# Patient Record
Sex: Female | Born: 1965
Health system: Southern US, Community
[De-identification: ages and names within clinical notes are randomized; demographics above are authoritative.]

## PROBLEM LIST (undated history)

## (undated) DIAGNOSIS — I639 Cerebral infarction, unspecified: Secondary | ICD-10-CM

## (undated) DIAGNOSIS — Z1379 Encounter for other screening for genetic and chromosomal anomalies: Principal | ICD-10-CM

## (undated) DIAGNOSIS — N189 Chronic kidney disease, unspecified: Secondary | ICD-10-CM

## (undated) DIAGNOSIS — F419 Anxiety disorder, unspecified: Secondary | ICD-10-CM

## (undated) DIAGNOSIS — Z803 Family history of malignant neoplasm of breast: Secondary | ICD-10-CM

## (undated) DIAGNOSIS — R110 Nausea: Secondary | ICD-10-CM

## (undated) DIAGNOSIS — T7840XA Allergy, unspecified, initial encounter: Secondary | ICD-10-CM

## (undated) DIAGNOSIS — C50919 Malignant neoplasm of unspecified site of unspecified female breast: Secondary | ICD-10-CM

## (undated) DIAGNOSIS — R51 Headache: Secondary | ICD-10-CM

## (undated) DIAGNOSIS — Z9221 Personal history of antineoplastic chemotherapy: Secondary | ICD-10-CM

## (undated) DIAGNOSIS — M797 Fibromyalgia: Secondary | ICD-10-CM

## (undated) DIAGNOSIS — R011 Cardiac murmur, unspecified: Secondary | ICD-10-CM

## (undated) DIAGNOSIS — S0300XA Dislocation of jaw, unspecified side, initial encounter: Secondary | ICD-10-CM

## (undated) DIAGNOSIS — F329 Major depressive disorder, single episode, unspecified: Secondary | ICD-10-CM

## (undated) HISTORY — DX: Allergy, unspecified, initial encounter: T78.40XA

## (undated) HISTORY — DX: Family history of malignant neoplasm of breast: Z80.3

## (undated) HISTORY — DX: Nausea: R11.0

## (undated) HISTORY — DX: Encounter for other screening for genetic and chromosomal anomalies: Z13.79

## (undated) HISTORY — DX: Malignant neoplasm of unspecified site of unspecified female breast: C50.919

## (undated) HISTORY — DX: Cardiac murmur, unspecified: R01.1

---
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
## 1991-07-17 DIAGNOSIS — T7840XA Allergy, unspecified, initial encounter: Secondary | ICD-10-CM

## 1991-07-17 DIAGNOSIS — R519 Headache, unspecified: Secondary | ICD-10-CM

## 1991-07-17 HISTORY — DX: Headache, unspecified: R51.9

## 1991-07-17 HISTORY — DX: Allergy, unspecified, initial encounter: T78.40XA

## 1993-07-16 DIAGNOSIS — F419 Anxiety disorder, unspecified: Secondary | ICD-10-CM

## 1993-07-16 DIAGNOSIS — S0300XA Dislocation of jaw, unspecified side, initial encounter: Secondary | ICD-10-CM

## 1993-07-16 HISTORY — DX: Anxiety disorder, unspecified: F41.9

## 1993-07-16 HISTORY — DX: Dislocation of jaw, unspecified side, initial encounter: S03.00XA

## 1998-04-19 ENCOUNTER — Other Ambulatory Visit: Admission: RE | Admit: 1998-04-19 | Discharge: 1998-04-19 | Payer: Self-pay | Admitting: *Deleted

## 1998-07-16 DIAGNOSIS — I639 Cerebral infarction, unspecified: Secondary | ICD-10-CM

## 1998-07-16 HISTORY — DX: Cerebral infarction, unspecified: I63.9

## 1998-10-31 ENCOUNTER — Inpatient Hospital Stay (HOSPITAL_COMMUNITY): Admission: AD | Admit: 1998-10-31 | Discharge: 1998-11-03 | Payer: Self-pay | Admitting: Obstetrics and Gynecology

## 1999-01-03 ENCOUNTER — Other Ambulatory Visit: Admission: RE | Admit: 1999-01-03 | Discharge: 1999-01-03 | Payer: Self-pay | Admitting: *Deleted

## 2000-02-21 ENCOUNTER — Other Ambulatory Visit: Admission: RE | Admit: 2000-02-21 | Discharge: 2000-02-21 | Payer: Self-pay | Admitting: *Deleted

## 2000-07-16 DIAGNOSIS — F32A Depression, unspecified: Secondary | ICD-10-CM

## 2000-07-16 DIAGNOSIS — M797 Fibromyalgia: Secondary | ICD-10-CM

## 2000-07-16 HISTORY — DX: Depression, unspecified: F32.A

## 2000-07-16 HISTORY — DX: Fibromyalgia: M79.7

## 2001-02-20 ENCOUNTER — Other Ambulatory Visit: Admission: RE | Admit: 2001-02-20 | Discharge: 2001-02-20 | Payer: Self-pay | Admitting: Obstetrics and Gynecology

## 2001-03-11 ENCOUNTER — Ambulatory Visit (HOSPITAL_COMMUNITY): Admission: RE | Admit: 2001-03-11 | Discharge: 2001-03-11 | Payer: Self-pay | Admitting: *Deleted

## 2001-03-11 ENCOUNTER — Encounter: Payer: Self-pay | Admitting: *Deleted

## 2002-03-20 ENCOUNTER — Other Ambulatory Visit: Admission: RE | Admit: 2002-03-20 | Discharge: 2002-03-20 | Payer: Self-pay | Admitting: Obstetrics and Gynecology

## 2003-05-13 ENCOUNTER — Other Ambulatory Visit: Admission: RE | Admit: 2003-05-13 | Discharge: 2003-05-13 | Payer: Self-pay | Admitting: Obstetrics and Gynecology

## 2008-12-15 ENCOUNTER — Encounter: Admission: RE | Admit: 2008-12-15 | Discharge: 2008-12-15 | Payer: Self-pay | Admitting: Obstetrics and Gynecology

## 2008-12-23 ENCOUNTER — Encounter: Admission: RE | Admit: 2008-12-23 | Discharge: 2008-12-23 | Payer: Self-pay | Admitting: Obstetrics and Gynecology

## 2009-07-16 HISTORY — PX: ENDOMETRIAL ABLATION: SHX621

## 2010-04-27 ENCOUNTER — Emergency Department (HOSPITAL_COMMUNITY): Admission: EM | Admit: 2010-04-27 | Discharge: 2010-04-27 | Payer: Self-pay | Admitting: Emergency Medicine

## 2010-09-28 ENCOUNTER — Other Ambulatory Visit: Payer: Self-pay | Admitting: Family Medicine

## 2010-09-28 DIAGNOSIS — Z1231 Encounter for screening mammogram for malignant neoplasm of breast: Secondary | ICD-10-CM

## 2010-10-02 ENCOUNTER — Ambulatory Visit
Admission: RE | Admit: 2010-10-02 | Discharge: 2010-10-02 | Disposition: A | Discharge status: Home / Self Care | Payer: 59 | Source: Ambulatory Visit | Attending: Family Medicine | Admitting: Family Medicine

## 2010-10-02 DIAGNOSIS — Z1231 Encounter for screening mammogram for malignant neoplasm of breast: Secondary | ICD-10-CM

## 2012-08-14 ENCOUNTER — Other Ambulatory Visit: Payer: Self-pay | Admitting: Medical

## 2012-08-14 DIAGNOSIS — Z1231 Encounter for screening mammogram for malignant neoplasm of breast: Secondary | ICD-10-CM

## 2012-09-12 ENCOUNTER — Ambulatory Visit: Payer: 59

## 2013-04-01 ENCOUNTER — Other Ambulatory Visit: Payer: Self-pay

## 2013-04-01 DIAGNOSIS — Z1231 Encounter for screening mammogram for malignant neoplasm of breast: Secondary | ICD-10-CM

## 2013-04-06 ENCOUNTER — Ambulatory Visit
Admission: RE | Admit: 2013-04-06 | Discharge: 2013-04-06 | Disposition: A | Discharge status: Home / Self Care | Payer: 59 | Source: Ambulatory Visit

## 2013-04-06 DIAGNOSIS — Z1231 Encounter for screening mammogram for malignant neoplasm of breast: Secondary | ICD-10-CM

## 2013-04-09 ENCOUNTER — Other Ambulatory Visit: Payer: Self-pay | Admitting: Medical

## 2013-04-09 DIAGNOSIS — R928 Other abnormal and inconclusive findings on diagnostic imaging of breast: Secondary | ICD-10-CM

## 2013-04-27 ENCOUNTER — Ambulatory Visit
Admission: RE | Admit: 2013-04-27 | Discharge: 2013-04-27 | Disposition: A | Discharge status: Home / Self Care | Payer: 59 | Source: Ambulatory Visit | Attending: Medical | Admitting: Medical

## 2013-04-27 DIAGNOSIS — R928 Other abnormal and inconclusive findings on diagnostic imaging of breast: Secondary | ICD-10-CM

## 2014-05-02 ENCOUNTER — Emergency Department (HOSPITAL_COMMUNITY)
Admission: EM | Admit: 2014-05-02 | Discharge: 2014-05-03 | Disposition: A | Discharge status: Rehabilitation Facility | Payer: 59 | Attending: Emergency Medicine | Admitting: Emergency Medicine

## 2014-05-02 ENCOUNTER — Encounter (HOSPITAL_COMMUNITY): Payer: Self-pay | Admitting: Emergency Medicine

## 2014-05-02 DIAGNOSIS — Z8673 Personal history of transient ischemic attack (TIA), and cerebral infarction without residual deficits: Secondary | ICD-10-CM | POA: Insufficient documentation

## 2014-05-02 DIAGNOSIS — R51 Headache: Secondary | ICD-10-CM | POA: Diagnosis not present

## 2014-05-02 DIAGNOSIS — F3110 Bipolar disorder, current episode manic without psychotic features, unspecified: Secondary | ICD-10-CM | POA: Diagnosis not present

## 2014-05-02 DIAGNOSIS — Z79899 Other long term (current) drug therapy: Secondary | ICD-10-CM | POA: Diagnosis not present

## 2014-05-02 DIAGNOSIS — Z87891 Personal history of nicotine dependence: Secondary | ICD-10-CM | POA: Diagnosis not present

## 2014-05-02 DIAGNOSIS — F1210 Cannabis abuse, uncomplicated: Secondary | ICD-10-CM | POA: Diagnosis not present

## 2014-05-02 DIAGNOSIS — F911 Conduct disorder, childhood-onset type: Secondary | ICD-10-CM | POA: Diagnosis present

## 2014-05-02 DIAGNOSIS — Z8719 Personal history of other diseases of the digestive system: Secondary | ICD-10-CM | POA: Diagnosis not present

## 2014-05-02 DIAGNOSIS — F309 Manic episode, unspecified: Secondary | ICD-10-CM

## 2014-05-02 HISTORY — DX: Dislocation of jaw, unspecified side, initial encounter: S03.00XA

## 2014-05-02 HISTORY — DX: Cerebral infarction, unspecified: I63.9

## 2014-05-02 NOTE — ED Notes (Signed)
Bed: JO84 Expected date:  Expected time:  Means of arrival:  Comments: EMS 40F combative, ?off meds

## 2014-05-02 NOTE — ED Notes (Signed)
Pt's family told EMS that pt has been acting aggressively since Thursday.  Question as to if pt has been taking her Zoloft and amitriptyline, pill bottles still have medications in them even though they are past due to be filled.

## 2014-05-03 DIAGNOSIS — F319 Bipolar disorder, unspecified: Secondary | ICD-10-CM

## 2014-05-03 LAB — CBC WITH DIFFERENTIAL/PLATELET
Basophils Absolute: 0 10*3/uL (ref 0.0–0.1)
Basophils Relative: 0 % (ref 0–1)
EOS ABS: 0 10*3/uL (ref 0.0–0.7)
Eosinophils Relative: 0 % (ref 0–5)
HCT: 43.2 % (ref 36.0–46.0)
HEMOGLOBIN: 14.7 g/dL (ref 12.0–15.0)
LYMPHS ABS: 1.7 10*3/uL (ref 0.7–4.0)
LYMPHS PCT: 19 % (ref 12–46)
MCH: 31.2 pg (ref 26.0–34.0)
MCHC: 34 g/dL (ref 30.0–36.0)
MCV: 91.7 fL (ref 78.0–100.0)
MONOS PCT: 10 % (ref 3–12)
Monocytes Absolute: 0.9 10*3/uL (ref 0.1–1.0)
NEUTROS ABS: 6.3 10*3/uL (ref 1.7–7.7)
NEUTROS PCT: 71 % (ref 43–77)
PLATELETS: 263 10*3/uL (ref 150–400)
RBC: 4.71 MIL/uL (ref 3.87–5.11)
RDW: 12.6 % (ref 11.5–15.5)
WBC: 9 10*3/uL (ref 4.0–10.5)

## 2014-05-03 LAB — URINALYSIS, ROUTINE W REFLEX MICROSCOPIC
Bilirubin Urine: NEGATIVE
Glucose, UA: NEGATIVE mg/dL
Ketones, ur: NEGATIVE mg/dL
NITRITE: NEGATIVE
PH: 6 (ref 5.0–8.0)
Protein, ur: NEGATIVE mg/dL
SPECIFIC GRAVITY, URINE: 1.006 (ref 1.005–1.030)
UROBILINOGEN UA: 0.2 mg/dL (ref 0.0–1.0)

## 2014-05-03 LAB — RAPID URINE DRUG SCREEN, HOSP PERFORMED
AMPHETAMINES: NOT DETECTED
BARBITURATES: NOT DETECTED
BENZODIAZEPINES: NOT DETECTED
Cocaine: NOT DETECTED
Opiates: NOT DETECTED
Tetrahydrocannabinol: POSITIVE — AB

## 2014-05-03 LAB — COMPREHENSIVE METABOLIC PANEL
ALK PHOS: 65 U/L (ref 39–117)
ALT: 15 U/L (ref 0–35)
AST: 21 U/L (ref 0–37)
Albumin: 4.4 g/dL (ref 3.5–5.2)
Anion gap: 15 (ref 5–15)
BILIRUBIN TOTAL: 0.4 mg/dL (ref 0.3–1.2)
BUN: 6 mg/dL (ref 6–23)
CHLORIDE: 99 meq/L (ref 96–112)
CO2: 25 mEq/L (ref 19–32)
Calcium: 9.2 mg/dL (ref 8.4–10.5)
Creatinine, Ser: 0.66 mg/dL (ref 0.50–1.10)
GFR calc non Af Amer: >90 mL/min (ref >90)
GLUCOSE: 116 mg/dL — AB (ref 70–99)
POTASSIUM: 3.7 meq/L (ref 3.7–5.3)
SODIUM: 139 meq/L (ref 137–147)
TOTAL PROTEIN: 7.9 g/dL (ref 6.0–8.3)

## 2014-05-03 LAB — ETHANOL

## 2014-05-03 LAB — URINE MICROSCOPIC-ADD ON

## 2014-05-03 MED ORDER — DIPHENHYDRAMINE HCL 50 MG/ML IJ SOLN
50.0000 mg | Freq: Once | INTRAMUSCULAR | Status: AC
  Administered 2014-05-03: 50 mg via INTRAMUSCULAR
  Filled 2014-05-03: qty 1

## 2014-05-03 MED ORDER — STERILE WATER FOR INJECTION IJ SOLN
INTRAMUSCULAR | Status: AC
  Administered 2014-05-03: 1.2 mL
  Filled 2014-05-03: qty 10

## 2014-05-03 MED ORDER — TRAZODONE HCL 100 MG PO TABS
100.0000 mg | ORAL_TABLET | Freq: Every day | ORAL | Status: DC
  Administered 2014-05-03: 100 mg via ORAL
  Filled 2014-05-03: qty 1

## 2014-05-03 MED ORDER — AMITRIPTYLINE HCL 25 MG PO TABS
25.0000 mg | ORAL_TABLET | Freq: Every evening | ORAL | Status: DC | PRN
  Administered 2014-05-03: 25 mg via ORAL
  Filled 2014-05-03: qty 1

## 2014-05-03 MED ORDER — SERTRALINE HCL 50 MG PO TABS
150.0000 mg | ORAL_TABLET | Freq: Every day | ORAL | Status: DC
  Administered 2014-05-03: 150 mg via ORAL
  Filled 2014-05-03: qty 3

## 2014-05-03 MED ORDER — ZIPRASIDONE MESYLATE 20 MG IM SOLR
20.0000 mg | Freq: Once | INTRAMUSCULAR | Status: AC
  Administered 2014-05-03: 20 mg via INTRAMUSCULAR
  Filled 2014-05-03: qty 20

## 2014-05-03 MED ORDER — LORAZEPAM 2 MG/ML IJ SOLN
2.0000 mg | Freq: Once | INTRAMUSCULAR | Status: AC
  Administered 2014-05-03: 2 mg via INTRAMUSCULAR
  Filled 2014-05-03: qty 1
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
## 2014-05-03 NOTE — ED Provider Notes (Signed)
CSN: [id number]     Arrival date & time 05/02/14  2314 History   First MD Initiated Contact with Patient 05/02/14 2324     Chief Complaint  Patient presents with  . Aggressive Behavior      HPI Patient has history bipolar disorder.  Patient was encouraged to come the emergency department by her family secondary to aggressive behavior at home and manic-like symptoms.  Family reports the patient has not slept in 3 nights.  The patient has abnormal thinking per the family.  They state that she cannot seem to complete a thought and her thoughts are very tangential.  History of similar symptoms before.  He states this usually occurs when she is noncompliant with her medications.  The patient admits to intermittent compliance with her Zoloft.  No recent illness.  No fevers or chills.  No vomiting.  Patient did have a mild headache today but this is not atypical for the patient.  No complaints of headache at this time.   Past Medical History  Diagnosis Date  . Bipolar 1 disorder   . TMJ (dislocation of temporomandibular joint)   . Stroke    Past Surgical History  Procedure Laterality Date  . Cesarean section     History reviewed. No pertinent family history. History  Substance Use Topics  . Smoking status: Former Research scientist (life sciences)  . Smokeless tobacco: Not on file  . Alcohol Use: Yes     Comment: sometimes   OB History   Grav Para Term Preterm Abortions TAB SAB Ect Mult Living                 Review of Systems  All other systems reviewed and are negative.     Allergies  Other  Home Medications   Prior to Admission medications   Medication Sig Start Date End Date Taking? Authorizing Provider  amitriptyline (ELAVIL) 25 MG tablet Take 25 mg by mouth at bedtime as needed for sleep.   Yes Historical Provider, MD  sertraline (ZOLOFT) 100 MG tablet Take 150 mg by mouth at bedtime.   Yes Historical Provider, MD   BP 139/76  Pulse 89  Temp(Src) 98.2 F (36.8 C) (Oral)  Resp 16  SpO2  100% Physical Exam  Nursing note and vitals reviewed. Constitutional: She is oriented to person, place, and time. She appears well-developed and well-nourished. No distress.  HENT:  Head: Normocephalic and atraumatic.  Eyes: EOM are normal.  Neck: Normal range of motion.  Cardiovascular: Normal rate, regular rhythm and normal heart sounds.   Pulmonary/Chest: Effort normal and breath sounds normal.  Abdominal: Soft. She exhibits no distension. There is no tenderness.  Musculoskeletal: Normal range of motion.  Neurological: She is alert and oriented to person, place, and time.  Skin: Skin is warm and dry.  Psychiatric: Judgment normal. Her mood appears not anxious. Her affect is not angry and not inappropriate. Her speech is not rapid and/or pressured. She is not agitated. Thought content is not paranoid and not delusional. She expresses no homicidal and no suicidal ideation. She exhibits abnormal recent memory.    ED Course  Procedures (including critical care time) Labs Review Labs Reviewed  URINALYSIS, ROUTINE W REFLEX MICROSCOPIC  CBC WITH DIFFERENTIAL  COMPREHENSIVE METABOLIC PANEL  URINE RAPID DRUG SCREEN (HOSP PERFORMED)  ETHANOL    Imaging Review No results found.   EKG Interpretation None      MDM   Final diagnoses:  None   Patient is likely having some degree  of increasing mania.  She has not been a significant manic state at this time but I believe that if she does not receive urgent stabilization and will return to this.  TTS consultation.  I think the patient will benefit from inpatient stabilization.  We'll restart her home meds at this time.    Hoy Morn, MD 05/03/14 [phone]

## 2014-05-03 NOTE — BH Assessment (Signed)
Received a call from Chapin with [place]. Sts that patient is accepted to [facility] by Dr. Dareen Piano. Nursing report # is [id number].

## 2014-05-03 NOTE — BH Assessment (Signed)
Assessment completed. Consulted Waylan Boga, NP who agrees that patient should be evaluated by psychiatry in the morning. Dr. Venora Maples has been informed of the recommendation.

## 2014-05-03 NOTE — ED Notes (Signed)
Patient walks out into the hall speaking loudly about conversations she has had with God as child. She is referring to her husband as a Programmer, systems" and that she want to be taken home " to heaven". Her conversation has been very sexual in nature and very disorganized.

## 2014-05-03 NOTE — ED Notes (Signed)
Password "[email or web address]" created w/ family so they can call and get updates on pt's condition.

## 2014-05-03 NOTE — ED Notes (Addendum)
Pt. Has one bag that is a red color with clothes in it and it is in locker 28.
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
## 2014-05-03 NOTE — ED Notes (Signed)
[facility] called for transport. 

## 2015-04-01 ENCOUNTER — Encounter: Payer: Self-pay | Admitting: Gastroenterology

## 2015-05-26 ENCOUNTER — Ambulatory Visit (AMBULATORY_SURGERY_CENTER): Payer: Self-pay

## 2015-05-26 VITALS — Ht 68.75 in | Wt 190.2 lb

## 2015-05-26 DIAGNOSIS — Z1211 Encounter for screening for malignant neoplasm of colon: Secondary | ICD-10-CM

## 2015-05-26 MED ORDER — SUPREP BOWEL PREP KIT 17.5-3.13-1.6 GM/177ML PO SOLN: 1.0000 | Freq: Once | ORAL | Status: DC

## 2015-05-26 NOTE — Progress Notes (Signed)
No allergies to soy BUT IS ALLERGIC TO FLU SHOT AND EGG WHITES No past problems with anesthesia except side effect of itching with spinal No diet/weight loss meds No home oxygen  Has email and internet; refused emmi

## 2015-05-27 ENCOUNTER — Encounter: Payer: Self-pay | Admitting: Gastroenterology

## 2015-06-03 ENCOUNTER — Encounter: Payer: Self-pay | Admitting: Gastroenterology

## 2015-06-03 ENCOUNTER — Ambulatory Visit (AMBULATORY_SURGERY_CENTER): Payer: Commercial Managed Care - HMO | Admitting: Gastroenterology

## 2015-06-03 VITALS — BP 155/87 | HR 65 | Temp 97.1°F | Resp 21 | Ht 68.75 in | Wt 190.0 lb

## 2015-06-03 DIAGNOSIS — D122 Benign neoplasm of ascending colon: Secondary | ICD-10-CM

## 2015-06-03 DIAGNOSIS — Z1211 Encounter for screening for malignant neoplasm of colon: Secondary | ICD-10-CM | POA: Diagnosis not present

## 2015-06-03 DIAGNOSIS — D120 Benign neoplasm of cecum: Secondary | ICD-10-CM | POA: Diagnosis not present

## 2015-06-03 MED ORDER — SODIUM CHLORIDE 0.9 % IV SOLN: 500.0000 mL | INTRAVENOUS | Status: DC

## 2015-06-03 NOTE — Progress Notes (Signed)
To recovery, report to Willis, RN, VSS 

## 2015-06-03 NOTE — Patient Instructions (Signed)
YOU HAD AN ENDOSCOPIC PROCEDURE TODAY AT THE [facility]:   Refer to the procedure report that was given to you for any specific questions about what was found during the examination.  If the procedure report does not answer your questions, please call your gastroenterologist to clarify.  If you requested that your care partner not be given the details of your procedure findings, then the procedure report has been included in a sealed envelope for you to review at your convenience later.  YOU SHOULD EXPECT: Some feelings of bloating in the abdomen. Passage of more gas than usual.  Walking can help get rid of the air that was put into your GI tract during the procedure and reduce the bloating. If you had a lower endoscopy (such as a colonoscopy or flexible sigmoidoscopy) you may notice spotting of blood in your stool or on the toilet paper. If you underwent a bowel prep for your procedure, you may not have a normal bowel movement for a few days.  Please Note:  You might notice some irritation and congestion in your nose or some drainage.  This is from the oxygen used during your procedure.  There is no need for concern and it should clear up in a day or so.  SYMPTOMS TO REPORT IMMEDIATELY:   Following lower endoscopy (colonoscopy or flexible sigmoidoscopy):  Excessive amounts of blood in the stool  Significant tenderness or worsening of abdominal pains  Swelling of the abdomen that is new, acute  Fever of 100F or higher   For urgent or emergent issues, a gastroenterologist can be reached at any hour by calling [phone].   DIET: Your first meal following the procedure should be a small meal and then it is ok to progress to your normal diet. Heavy or fried foods are harder to digest and may make you feel nauseous or bloated.  Likewise, meals heavy in dairy and vegetables can increase bloating.  Drink plenty of fluids but you should avoid alcoholic beverages for 24  hours.  ACTIVITY:  You should plan to take it easy for the rest of today and you should NOT DRIVE or use heavy machinery until tomorrow (because of the sedation medicines used during the test).    FOLLOW UP: Our staff will call the number listed on your records the next business day following your procedure to check on you and address any questions or concerns that you may have regarding the information given to you following your procedure. If we do not reach you, we will leave a message.  However, if you are feeling well and you are not experiencing any problems, there is no need to return our call.  We will assume that you have returned to your regular daily activities without incident.  If any biopsies were taken you will be contacted by phone or by letter within the next 1-3 weeks.  Please call us at [phone] if you have not heard about the biopsies in 3 weeks.    SIGNATURES/CONFIDENTIALITY: You and/or your care partner have signed paperwork which will be entered into your electronic medical record.  These signatures attest to the fact that that the information above on your After Visit Summary has been reviewed and is understood.  Full responsibility of the confidentiality of this discharge information lies with you and/or your care-partner.    Handouts were given to your care partner on polyps, diverticulosis, hemorrhoids, and a high fiber diet with liberal fluid intake.  You   may resume your current medications today. Await biopsy results. Please call if any questions or concerns.   

## 2015-06-03 NOTE — Progress Notes (Signed)
No problems noted in the recovery room. Maw  Per Dr. Ardis Hughs per called Eulis Canner, CMA to make a referral for to genaral surgery for external hemorrhoid care.  14:10 left voice mail on [employer]'s answering machine to call pt and arrange. maw

## 2015-06-03 NOTE — Progress Notes (Signed)
Pt states that she cannot eat raw eggs- gives her a rash.  She can eat them cooked.  Waynard Edwards CRNA made aware per [employer] RN and ok to proceed with propofol

## 2015-06-03 NOTE — Op Note (Signed)
[place]  [employer] [place], [id number]   COLONOSCOPY PROCEDURE REPORT  PATIENT: Amy Dawson, Amy Dawson  MR#: [id number] BIRTHDATE: April 29, 1966 , 49  yrs. old GENDER: female ENDOSCOPIST: Milus Banister, MD PROCEDURE DATE:  06/03/2015 PROCEDURE:   Colonoscopy, screening and Colonoscopy with snare polypectomy First Screening Colonoscopy - Avg.  risk and is 50 yrs.  old or older Yes.  Prior Negative Screening - Now for repeat screening. N/A  History of Adenoma - Now for follow-up colonoscopy & has been > or = to 3 yrs.  N/A  Polyps removed today? Yes ASA CLASS:   Class II INDICATIONS:Screening for colonic neoplasia and Colorectal Neoplasm Risk Assessment for this procedure is average risk. MEDICATIONS: Monitored anesthesia care and Propofol 300 mg IV  DESCRIPTION OF PROCEDURE:   After the risks benefits and alternatives of the procedure were thoroughly explained, informed consent was obtained.  The digital rectal exam revealed no abnormalities of the rectum.   The LB [id number]  endoscope was introduced through the anus and advanced to the cecum, which was identified by both the appendix and ileocecal valve. No adverse events experienced.   The quality of the prep was excellent.  The instrument was then slowly withdrawn as the colon was fully examined. Estimated blood loss is zero unless otherwise noted in this procedure report.  COLON FINDINGS: Two sessile polyps ranging between 3-35mm in size were found in the ascending colon and at the cecum.  Polypectomies were performed with a cold snare.  The resection was complete, the polyp tissue was completely retrieved and sent to histology. There was mild diverticulosis noted throughout the entire examined colon.   The examination was otherwise normal.  Retroflexed views revealed no abnormalities and Retroflexed views revealed external hemorrhoids. The time to cecum = 2.0 Withdrawal time = 11.8   The scope was  withdrawn and the procedure completed. COMPLICATIONS: There were no immediate complications.  ENDOSCOPIC IMPRESSION: 1.   Two sessile polyps ranging between 3-49mm in size were found in the ascending colon and at the cecum; polypectomies were performed with a cold snare 2.   Mild diverticulosis was noted throughout the entire examined colon 3.   Small external hemorrhoids 3.   The examination was otherwise normal  RECOMMENDATIONS: If the polyp(s) removed today are proven to be adenomatous (pre-cancerous) polyps, you will need a repeat colonoscopy in 5 years.  Otherwise you should continue to follow colorectal cancer screening guidelines for "routine risk" patients with colonoscopy in 10 years.  You will receive a letter within 1-2 weeks with the results of your biopsy as well as final recommendations.  Please call my office if you have not received a letter after 3 weeks.  eSigned:  Milus Banister, MD 06/03/2015 1:46 PM

## 2015-06-03 NOTE — Progress Notes (Signed)
Called to room to assist during endoscopic procedure.  Patient ID and intended procedure confirmed with present staff. Received instructions for my participation in the procedure from the performing physician.  

## 2015-06-06 ENCOUNTER — Telehealth: Payer: Self-pay | Admitting: *Deleted

## 2015-06-06 NOTE — Telephone Encounter (Signed)
Number changed or disconnected per voice mail

## 2015-06-06 NOTE — Telephone Encounter (Signed)
  Follow up Call-  Number has been changed or disconnected attempted x2

## 2015-06-07 ENCOUNTER — Telehealth: Payer: Self-pay

## 2015-06-07 NOTE — Telephone Encounter (Signed)
You have been scheduled for an appointment with Dr Oda Cogan at [facility]. Your appointment is on 06/28/15 at 10:20 am. Please arrive at 9:50 am for registration. Make certain to bring a list of current medications, including any over the counter medications or vitamins. Also bring your co-pay if you have one as well as your insurance cards. [facility] is located at [address]. Should you need to reschedule your appointment, please contact them at [phone].   The patient has been notified of this information and all questions answered.

## 2015-06-07 NOTE — Telephone Encounter (Signed)
Verbal order per Dr Ardis Hughs after procedure, pt needs to have [facility] referral for external hemorrhoids.

## 2015-06-12 ENCOUNTER — Encounter: Payer: Self-pay | Admitting: Gastroenterology

## 2015-09-29 ENCOUNTER — Other Ambulatory Visit: Payer: Self-pay

## 2015-09-29 DIAGNOSIS — Z1231 Encounter for screening mammogram for malignant neoplasm of breast: Secondary | ICD-10-CM

## 2015-10-03 ENCOUNTER — Ambulatory Visit: Payer: Commercial Managed Care - HMO

## 2016-04-04 ENCOUNTER — Ambulatory Visit
Admission: RE | Admit: 2016-04-04 | Discharge: 2016-04-04 | Disposition: A | Discharge status: Home / Self Care | Payer: Commercial Managed Care - HMO | Source: Ambulatory Visit

## 2016-04-04 DIAGNOSIS — Z1231 Encounter for screening mammogram for malignant neoplasm of breast: Secondary | ICD-10-CM

## 2016-07-16 DIAGNOSIS — Z9221 Personal history of antineoplastic chemotherapy: Secondary | ICD-10-CM

## 2016-07-16 HISTORY — DX: Personal history of antineoplastic chemotherapy: Z92.21

## 2017-05-06 ENCOUNTER — Other Ambulatory Visit: Payer: Self-pay | Admitting: Family Medicine

## 2017-05-06 DIAGNOSIS — Z1231 Encounter for screening mammogram for malignant neoplasm of breast: Secondary | ICD-10-CM

## 2017-05-08 ENCOUNTER — Other Ambulatory Visit: Payer: Self-pay | Admitting: Obstetrics and Gynecology

## 2017-05-08 DIAGNOSIS — N63 Unspecified lump in unspecified breast: Secondary | ICD-10-CM

## 2017-05-09 ENCOUNTER — Ambulatory Visit
Admission: RE | Admit: 2017-05-09 | Discharge: 2017-05-09 | Disposition: A | Discharge status: Home / Self Care | Payer: Commercial Managed Care - HMO | Source: Ambulatory Visit | Attending: Obstetrics and Gynecology | Admitting: Obstetrics and Gynecology

## 2017-05-09 ENCOUNTER — Other Ambulatory Visit: Payer: Self-pay | Admitting: Obstetrics and Gynecology

## 2017-05-09 DIAGNOSIS — N63 Unspecified lump in unspecified breast: Secondary | ICD-10-CM

## 2017-05-09 DIAGNOSIS — N6320 Unspecified lump in the left breast, unspecified quadrant: Secondary | ICD-10-CM

## 2017-05-14 ENCOUNTER — Other Ambulatory Visit: Payer: Self-pay | Admitting: Obstetrics and Gynecology

## 2017-05-14 ENCOUNTER — Ambulatory Visit
Admission: RE | Admit: 2017-05-14 | Discharge: 2017-05-14 | Disposition: A | Discharge status: Home / Self Care | Payer: Commercial Managed Care - HMO | Source: Ambulatory Visit | Attending: Obstetrics and Gynecology | Admitting: Obstetrics and Gynecology

## 2017-05-14 DIAGNOSIS — N6320 Unspecified lump in the left breast, unspecified quadrant: Secondary | ICD-10-CM

## 2017-05-14 HISTORY — PX: BREAST BIOPSY: SHX20

## 2017-05-16 ENCOUNTER — Encounter: Payer: Self-pay | Admitting: *Deleted

## 2017-05-21 ENCOUNTER — Other Ambulatory Visit: Payer: Self-pay | Admitting: *Deleted

## 2017-05-21 DIAGNOSIS — Z171 Estrogen receptor negative status [ER-]: Principal | ICD-10-CM

## 2017-05-21 DIAGNOSIS — C50412 Malignant neoplasm of upper-outer quadrant of left female breast: Secondary | ICD-10-CM

## 2017-05-22 ENCOUNTER — Encounter: Payer: Self-pay | Admitting: Physical Therapy

## 2017-05-22 ENCOUNTER — Ambulatory Visit: Payer: 59 | Attending: General Surgery | Admitting: Physical Therapy

## 2017-05-22 ENCOUNTER — Other Ambulatory Visit: Payer: Self-pay | Admitting: *Deleted

## 2017-05-22 ENCOUNTER — Encounter: Payer: Self-pay | Admitting: Oncology

## 2017-05-22 ENCOUNTER — Other Ambulatory Visit (HOSPITAL_BASED_OUTPATIENT_CLINIC_OR_DEPARTMENT_OTHER): Payer: Commercial Managed Care - HMO

## 2017-05-22 ENCOUNTER — Ambulatory Visit: Payer: Commercial Managed Care - HMO | Admitting: Oncology

## 2017-05-22 ENCOUNTER — Ambulatory Visit
Admission: RE | Admit: 2017-05-22 | Discharge: 2017-05-22 | Disposition: A | Discharge status: Home / Self Care | Payer: Commercial Managed Care - HMO | Source: Ambulatory Visit | Attending: Radiation Oncology | Admitting: Radiation Oncology

## 2017-05-22 ENCOUNTER — Encounter: Payer: Self-pay | Admitting: *Deleted

## 2017-05-22 VITALS — BP 130/86 | HR 73 | Temp 98.1°F | Resp 18 | Ht 68.75 in | Wt 176.9 lb

## 2017-05-22 DIAGNOSIS — C50412 Malignant neoplasm of upper-outer quadrant of left female breast: Secondary | ICD-10-CM

## 2017-05-22 DIAGNOSIS — R293 Abnormal posture: Secondary | ICD-10-CM | POA: Diagnosis present

## 2017-05-22 DIAGNOSIS — I619 Nontraumatic intracerebral hemorrhage, unspecified: Secondary | ICD-10-CM

## 2017-05-22 DIAGNOSIS — Z171 Estrogen receptor negative status [ER-]: Secondary | ICD-10-CM | POA: Diagnosis not present

## 2017-05-22 DIAGNOSIS — M797 Fibromyalgia: Secondary | ICD-10-CM | POA: Insufficient documentation

## 2017-05-22 LAB — CBC WITH DIFFERENTIAL/PLATELET
BASO%: 0.6 % (ref 0.0–2.0)
Basophils Absolute: 0 10*3/uL (ref 0.0–0.1)
EOS%: 1.7 % (ref 0.0–7.0)
Eosinophils Absolute: 0.1 10*3/uL (ref 0.0–0.5)
HEMATOCRIT: 39.9 % (ref 34.8–46.6)
HGB: 13.4 g/dL (ref 11.6–15.9)
LYMPH%: 34.8 % (ref 14.0–49.7)
MCH: 30.4 pg (ref 25.1–34.0)
MCHC: 33.5 g/dL (ref 31.5–36.0)
MCV: 90.6 fL (ref 79.5–101.0)
MONO#: 0.3 10*3/uL (ref 0.1–0.9)
MONO%: 8 % (ref 0.0–14.0)
NEUT#: 2.4 10*3/uL (ref 1.5–6.5)
NEUT%: 54.9 % (ref 38.4–76.8)
Platelets: 247 10*3/uL (ref 145–400)
RBC: 4.41 10*6/uL (ref 3.70–5.45)
RDW: 13.3 % (ref 11.2–14.5)
WBC: 4.4 10*3/uL (ref 3.9–10.3)
lymph#: 1.5 10*3/uL (ref 0.9–3.3)

## 2017-05-22 LAB — COMPREHENSIVE METABOLIC PANEL
ALK PHOS: 73 U/L (ref 40–150)
ALT: 9 U/L (ref 0–55)
AST: 13 U/L (ref 5–34)
Albumin: 3.7 g/dL (ref 3.5–5.0)
Anion Gap: 8 mEq/L (ref 3–11)
BUN: 8 mg/dL (ref 7.0–26.0)
CALCIUM: 9.1 mg/dL (ref 8.4–10.4)
CO2: 27 mEq/L (ref 22–29)
Chloride: 106 mEq/L (ref 98–109)
Creatinine: 0.8 mg/dL (ref 0.6–1.1)
GLUCOSE: 117 mg/dL (ref 70–140)
Potassium: 4 mEq/L (ref 3.5–5.1)
Sodium: 141 mEq/L (ref 136–145)
TOTAL PROTEIN: 7.1 g/dL (ref 6.4–8.3)
Total Bilirubin: 0.3 mg/dL (ref 0.20–1.20)

## 2017-05-22 MED ORDER — LORAZEPAM 0.5 MG PO TABS: 0.5000 mg | ORAL_TABLET | Freq: Every evening | ORAL | 0 refills | Status: DC | PRN

## 2017-05-22 MED ORDER — DEXAMETHASONE 4 MG PO TABS: ORAL_TABLET | ORAL | 1 refills | Status: DC

## 2017-05-22 MED ORDER — LIDOCAINE-PRILOCAINE 2.5-2.5 % EX CREA: TOPICAL_CREAM | CUTANEOUS | 3 refills | Status: DC

## 2017-05-22 MED ORDER — PROCHLORPERAZINE MALEATE 10 MG PO TABS: 10.0000 mg | ORAL_TABLET | Freq: Four times a day (QID) | ORAL | 1 refills | Status: DC | PRN

## 2017-05-22 NOTE — Progress Notes (Signed)
Radiation Oncology         [phone] ________________________________  Name: Amy Dawson        MRN: [id number]  Date of Service: 05/22/2017 DOB: Jun 26, 1966  NI:OEVOJJK, No Pcp Per  [name], MD     REFERRING PHYSICIAN: [name], MD   DIAGNOSIS: The encounter diagnosis was Malignant neoplasm of upper-outer quadrant of left breast in female, estrogen receptor negative ([place]).   HISTORY OF PRESENT ILLNESS: Amy Dawson is a 51 y.o. female seen in the multidisciplinary breast clinic for a new diagnosis of left breast cancer. The patient was noted to have a palpable mass in the left breast and on diagnostic imaging which revealed a 2.9 x 2.5 x 2.2 cm at 12:30. Her axilla was negative for adenopathy. She underwent biopsy of this site on 05/14/17 which revealed a grade 2-3 triple negative invasive ductal carcinoma of the left breast with a Ki 67 of 80%. She comes today to discuss options of treatment for her cancer.  PREVIOUS RADIATION THERAPY: No   PAST MEDICAL HISTORY:  Past Medical History:  Diagnosis Date  . Allergy   . Bipolar 1 disorder ([place])   . Murmur, cardiac    as a child  . Stroke ([place])   . TMJ (dislocation of temporomandibular joint)        PAST SURGICAL HISTORY: Past Surgical History:  Procedure Laterality Date  . CESAREAN SECTION     2     FAMILY HISTORY:  Family History  Problem Relation Age of Onset  . Liver cancer Sister   . Breast cancer Maternal Aunt   . Breast cancer Maternal Grandmother   . Colon cancer Neg Hx   . Esophageal cancer Neg Hx   . Rectal cancer Neg Hx   . Stomach cancer Neg Hx      SOCIAL HISTORY:  reports that she has quit smoking. she has never used smokeless tobacco. She reports that she drinks alcohol. She reports that she uses drugs. Drug: Marijuana. The patient is married and lives in [place]. 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   ALLERGIES: Other   MEDICATIONS:  Current  Outpatient Medications  Medication Sig Dispense Refill  . amitriptyline (ELAVIL) 25 MG tablet Take 25 mg by mouth at bedtime as needed for sleep.    . Cetirizine HCl (ZYRTEC ALLERGY PO) Take by mouth daily.    . fluticasone (FLONASE) 50 MCG/ACT nasal spray Place into both nostrils as needed for allergies or rhinitis.    . Pseudoephedrine HCl (SUDAFED PO) Take by mouth as needed.    . sertraline (ZOLOFT) 100 MG tablet Take 150 mg by mouth at bedtime.     No current facility-administered medications for this encounter.      REVIEW OF SYSTEMS: On review of systems, the patient reports that she is doing well overall. She denies any chest pain, shortness of breath, cough, fevers, chills, night sweats, unintended weight changes. She denies any bowel or bladder disturbances, and denies abdominal pain, nausea or vomiting. She denies any new musculoskeletal or joint aches or pains. A complete review of systems is obtained and is otherwise negative.     PHYSICAL EXAM:  Wt Readings from Last 3 Encounters:  06/03/15 190 lb (86.2 kg)  05/26/15 190 lb 3.2 oz (86.3 kg)   Temp Readings from Last 3 Encounters:  06/03/15 97.1 F (36.2 C)  05/03/14 98.7 F (37.1 C) (Oral)   BP Readings from Last 3  Encounters:  06/03/15 (!) 155/87  05/03/14 109/56   Pulse Readings from Last 3 Encounters:  06/03/15 65  05/03/14 68     In general this is a well appearing caucasian female in no acute distress. She is alert and oriented x4 and appropriate throughout the examination. HEENT reveals that the patient is normocephalic, atraumatic. EOMs are intact. PERRLA. Skin is intact without any evidence of gross lesions. Cardiovascular exam reveals a regular rate and rhythm, no clicks rubs or murmurs are auscultated. Chest is clear to auscultation bilaterally. Lymphatic assessment is performed and does not reveal any adenopathy in the cervical, supraclavicular, axillary, or inguinal chains. Bilateral breast exam is  performed and reveals fullness in the left breast that is mobile consistent with her known mass. There is also bruising of the skin. No masses are noted in the right breast. No nipple discharge is noted of either breast. Abdomen has active bowel sounds in all quadrants and is intact. The abdomen is soft, non tender, non distended. Lower extremities are negative for pretibial pitting edema, deep calf tenderness, cyanosis or clubbing.   ECOG = 0  0 - Asymptomatic (Fully active, able to carry on all predisease activities without restriction)  1 - Symptomatic but completely ambulatory (Restricted in physically strenuous activity but ambulatory and able to carry out work of a light or sedentary nature. For example, light housework, office work)  2 - Symptomatic, <50% in bed during the day (Ambulatory and capable of all self care but unable to carry out any work activities. Up and about more than 50% of waking hours)  3 - Symptomatic, >50% in bed, but not bedbound (Capable of only limited self-care, confined to bed or chair 50% or more of waking hours)  4 - Bedbound (Completely disabled. Cannot carry on any self-care. Totally confined to bed or chair)  5 - Death   Eustace Pen MM, Creech RH, Tormey DC, et al. [phone]). "Toxicity and response criteria of the [facility]". [place] Oncol. 5 (6): 649-55    LABORATORY DATA:  Lab Results  Component Value Date   WBC 9.0 05/03/2014   HGB 14.7 05/03/2014   HCT 43.2 05/03/2014   MCV 91.7 05/03/2014   PLT 263 05/03/2014   Lab Results  Component Value Date   NA 139 05/03/2014   K 3.7 05/03/2014   CL 99 05/03/2014   CO2 25 05/03/2014   Lab Results  Component Value Date   ALT 15 05/03/2014   AST 21 05/03/2014   ALKPHOS 65 05/03/2014   BILITOT 0.4 05/03/2014      RADIOGRAPHY: US Breast Ltd Uni Left Inc Axilla  Result Date: 05/09/2017 CLINICAL DATA:  51 year old female with palpable upper outer left breast lump identified  on self-examination and for annual bilateral mammograms. EXAM: 2D DIGITAL DIAGNOSTIC BILATERAL MAMMOGRAM WITH CAD AND ADJUNCT TOMO ULTRASOUND LEFT BREAST COMPARISON:  Previous exam(s). ACR Breast Density Category c: The breast tissue is heterogeneously dense, which may obscure small masses. FINDINGS: 2D and 3D full field views of both breasts and spot compression view of the left breast demonstrates an obscured mass within the upper-outer left breast. No findings suspicious for malignancy are identified on the right. Mammographic images were processed with CAD. On physical exam, a firm palpable mass is identified at the 12:30 position of the left breast 5 cm from the nipple Targeted ultrasound is performed, showing a 2.5 x 2.2 x 2.9 cm irregular hypoechoic mass at the 12:30 position of the left  breast 5 cm from the nipple. No abnormal left axillary lymph nodes are identified. IMPRESSION: Suspicious 2.5 x 2.2 x 2.9 cm mass in the upper slightly outer left breast. Tissue sampling recommended. RECOMMENDATION: Ultrasound-guided left breast biopsy, which will be arranged. I have discussed the findings and recommendations with the patient. Results were also provided in writing at the conclusion of the visit. If applicable, a reminder letter will be sent to the patient regarding the next appointment. BI-RADS CATEGORY  4: Suspicious. Electronically Signed   By: Margarette Canada M.D.   On: 05/09/2017 16:57   Mm Diag Breast Tomo Bilateral  Result Date: 05/09/2017 CLINICAL DATA:  51 year old female with palpable upper outer left breast lump identified on self-examination and for annual bilateral mammograms. EXAM: 2D DIGITAL DIAGNOSTIC BILATERAL MAMMOGRAM WITH CAD AND ADJUNCT TOMO ULTRASOUND LEFT BREAST COMPARISON:  Previous exam(s). ACR Breast Density Category c: The breast tissue is heterogeneously dense, which may obscure small masses. FINDINGS: 2D and 3D full field views of both breasts and spot compression view of the left  breast demonstrates an obscured mass within the upper-outer left breast. No findings suspicious for malignancy are identified on the right. Mammographic images were processed with CAD. On physical exam, a firm palpable mass is identified at the 12:30 position of the left breast 5 cm from the nipple Targeted ultrasound is performed, showing a 2.5 x 2.2 x 2.9 cm irregular hypoechoic mass at the 12:30 position of the left breast 5 cm from the nipple. No abnormal left axillary lymph nodes are identified. IMPRESSION: Suspicious 2.5 x 2.2 x 2.9 cm mass in the upper slightly outer left breast. Tissue sampling recommended. RECOMMENDATION: Ultrasound-guided left breast biopsy, which will be arranged. I have discussed the findings and recommendations with the patient. Results were also provided in writing at the conclusion of the visit. If applicable, a reminder letter will be sent to the patient regarding the next appointment. BI-RADS CATEGORY  4: Suspicious. Electronically Signed   By: Margarette Canada M.D.   On: 05/09/2017 16:57   Mm Clip Placement Left  Result Date: 05/14/2017 CLINICAL DATA:  Confirmation of clip placement after ultrasound-guided core needle biopsy of a highly suspicious mass in the upper outer quadrant of the left breast. EXAM: DIAGNOSTIC LEFT MAMMOGRAM POST ULTRASOUND BIOPSY COMPARISON:  Previous exam(s). FINDINGS: Mammographic images were obtained following ultrasound guided biopsy of a mass in the upper outer quadrant of the left breast. Of note, the mass was so firm that the introducer needle for the biopsy clip would not enter the mass and started to bend, so the clip was deployed just anterior to the mass. The ribbon shaped tissue marker clip is positioned approximately 5-6 mm anterior to the mass, corresponding to the site of deployment. Expected post biopsy changes are present without evidence of hematoma. IMPRESSION: Positioning of the ribbon shaped tissue marker clip approximately 5-6 mm  anterior to the mass at the site of clip deployment - the mass was so firm that the introducer needle for the clip would not enter the mass without bending, so the clip was deployed anterior to the mass. Final Assessment: Post Procedure Mammograms for Marker Placement Electronically Signed   By: Evangeline Dakin M.D.   On: 05/14/2017 15:44   [place] Lt Breast Bx W Loc Dev 1st Lesion Img Bx Spec US Guide  Addendum Date: 05/16/2017   ADDENDUM REPORT: 05/16/2017 15:22 ADDENDUM: Pathology revealed GRADE II-III INVASIVE DUCTAL CARCINOMA of the Left breast, upper outer quadrant, 12:30 o'clock, 5  cm from the nipple. This was found to be concordant by Dr. Peggye Fothergill. Pathology results were discussed with the patient by telephone. The patient reported doing well after the biopsy with tenderness at the site. Post biopsy instructions and care were reviewed and questions were answered. The patient was encouraged to call [facility] for any additional concerns. The patient was referred to [facility] on May 22, 2017. Pathology results reported by Terie Purser, RN on 05/16/2017. Electronically Signed   By: Evangeline Dakin M.D.   On: 05/16/2017 15:22   Result Date: 05/16/2017 CLINICAL DATA:  51 year old who presented with a palpable mass in the upper left breast, shown on diagnostic workup to have a highly suspicious 2.9 cm mass in the upper outer quadrant at the 12:30 o'clock position approximately 5 cm from the nipple. EXAM: ULTRASOUND GUIDED LEFT BREAST CORE NEEDLE BIOPSY COMPARISON:  Previous exam(s). FINDINGS: I met with the patient and we discussed the procedure of ultrasound-guided biopsy, including benefits and alternatives. We discussed the high likelihood of a successful procedure. We discussed the risks of the procedure, including infection, bleeding, tissue injury, clip migration, and inadequate sampling.  Informed written consent was given. The usual time-out protocol was performed immediately prior to the procedure. Lesion quadrant: Upper outer quadrant. Using sterile technique with chlorhexidine as skin antisepsis, 1% Lidocaine as local anesthetic, under direct ultrasound visualization, a 12 gauge Bard Marquee core needle device was used to perform biopsy of the 2.9 cm mass in the upper outer quadrant at the 12:30 o'clock position approximately 5 cm from the nipple using a lateral approach. At the conclusion of the procedure a ribbon tissue marker clip was deployed into the biopsy cavity. Follow up 2 view mammogram was performed and dictated separately. IMPRESSION: Ultrasound guided biopsy of a 2.9 cm mass in the upper outer quadrant of the left breast. No apparent complications. Electronically Signed: By: Evangeline Dakin M.D. On: 05/14/2017 15:42       IMPRESSION/PLAN: 1. Stage IIB, cT2N0M0 grade 2-3 triple negative invasive ductal carcinoma of the left breast. Dr. Lisbeth Renshaw discusses the pathology findings and reviews the nature of invasive ductal breast disease. The consensus from the breast conference included metastatic work up with genetic counseling and port a cath placement for neoadjuvant chemotherapy. Following completion of chemotherapy, she would proceed with surgical resection and then would be offered external radiotherapy to the breast. We discussed the risks, benefits, short, and long term effects of radiotherapy, and the patient is interested in proceeding. Dr. Lisbeth Renshaw discusses the delivery and logistics of radiotherapy and anticipates a course of 6 1/2 weeks with deep inspiration breath hold technique. We will see her back about 2 weeks after surgery to move forward with the simulation and planning process and anticipate starting radiotherapy about 4 weeks after surgery.  2. Possible genetic predisposition to malignancy. The patient is counseled on the rationale for referral to genetic  counseling. She is interested in testing and will be referred.     The above documentation reflects my direct findings during this shared patient visit. Please see the separate note by Dr. Lisbeth Renshaw on this date for the remainder of the patient's plan of care.    Carola Rhine, PAC

## 2017-05-22 NOTE — Patient Instructions (Signed)

## 2017-05-22 NOTE — Progress Notes (Unsigned)
Nutrition Assessment  Reason for Assessment:  Pt seen in Breast Clinic  ASSESSMENT:   51 year old female with new diagnosis of breast cancer.  Past medical history reviewed.  Patient reports decreased appetite since diagnosis.  Interested in trying [employer]  Medications:  reviewed  Labs: reviewed  Anthropometrics:   Height: 68 inches Weight: 176 lb BMI: 26   NUTRITION DIAGNOSIS: Food and nutrition related knowledge deficit related to new diagnosis of breast cancer as evidenced by no prior need for nutrition related information.  INTERVENTION:   Discussed and provided packet of information regarding nutritional tips for breast cancer patients. Discussed evidenced based recommendations regarding diet. Contact information provided and patient knows to contact me with questions/concerns.   MONITORING, EVALUATION, and GOAL: Pt will consume a healthy plant based diet to maintain lean body mass throughout treatment.   Bailynn Dyk B. Zenia Resides, [place], [place] Registered Dietitian [phone] (pager)

## 2017-05-22 NOTE — Therapy (Addendum)
[place], [place], [address] Phone: [phone]   Fax:  [phone]  Physical Therapy Evaluation  Patient Details  Name: Amy Dawson MRN: [id number] Date of Birth: August 20, 1965 Referring Provider: Dr. [name]   Encounter Date: 05/22/2017  PT End of Session - 05/22/17 2139    Visit Number  1    Number of Visits  2    Date for PT Re-Evaluation  07/17/17    PT Start Time  2952    PT Stop Time  1350 Also saw pt from 8413-2440 for a total of 26 minutes   Also saw pt from 1410-1423 for a total of 26 minutes   PT Time Calculation (min)  13 min    Activity Tolerance  Patient tolerated treatment well    Behavior During Therapy  [facility] for tasks assessed/performed       Past Medical History:  Diagnosis Date  . Allergy   . Bipolar 1 disorder ([place])   . Murmur, cardiac    as a child  . Stroke ([place])   . TMJ (dislocation of temporomandibular joint)     Past Surgical History:  Procedure Laterality Date  . CESAREAN SECTION     2    There were no vitals filed for this visit.   Subjective Assessment - 05/22/17 2140    Pertinent History  Patient was diagnosed on 05/09/17 with left grade 2-3 triple negative invasive ductal carcinoma breast cancer. It measures 2.9 cm and is located in the upper outer quadrant. The Ki67 is 80%.         [facility] PT Assessment - 05/22/17 0001      Assessment   Medical Diagnosis  Left breast cancer    Referring Provider  Dr. [name]    Onset Date/Surgical Date  05/09/17    Hand Dominance  Right    Prior Therapy  none      Precautions   Precautions  Other (comment)    Precaution Comments  active cancer      Restrictions   Weight Bearing Restrictions  No      Balance Screen   Has the patient fallen in the past 6 months  No    Has the patient had a decrease in activity level because of a fear of falling?   No    Is the patient reluctant to leave  their home because of a fear of falling?   No      Home Film/video editor residence    Living Arrangements  Spouse/significant other    Available Help at Discharge  Family      Prior Function   Level of [place]  Retired    Leisure  She walks 2x/week for 20 minutes      Cognition   Overall Cognitive Status  Within Functional Limits for tasks assessed      Posture/Postural Control   Posture/Postural Control  Postural limitations    Postural Limitations  Rounded Shoulders;Forward head;Flexed trunk      ROM / Strength   AROM / PROM / Strength  AROM;Strength      AROM   AROM Assessment Site  Shoulder;Cervical    Right/Left Shoulder  Left;Right    Right Shoulder Extension  39 Degrees    Right Shoulder Flexion  155 Degrees    Right Shoulder ABduction  169 Degrees    Right Shoulder Internal Rotation  70 Degrees    Right Shoulder External Rotation  90 Degrees    Left Shoulder Extension  47 Degrees    Left Shoulder Flexion  156 Degrees    Left Shoulder ABduction  166 Degrees    Left Shoulder Internal Rotation  65 Degrees    Left Shoulder External Rotation  90 Degrees    Cervical Flexion  WNL    Cervical Extension  WNL    Cervical - Right Side Bend  WNL    Cervical - Left Side Bend  WNL    Cervical - Right Rotation  WNL    Cervical - Left Rotation  WNL      Strength   Overall Strength  Within functional limits for tasks performed        LYMPHEDEMA/ONCOLOGY QUESTIONNAIRE - 05/22/17 2138      Type   Cancer Type  Left breast cancer      Lymphedema Assessments   Lymphedema Assessments  Upper extremities      Right Upper Extremity Lymphedema   10 cm Proximal to Olecranon Process  30.2 cm    Olecranon Process  26 cm    10 cm Proximal to Ulnar Styloid Process  23.5 cm    Just Proximal to Ulnar Styloid Process  16.7 cm    Across Hand at [employer]  20 cm    At [place] of 2nd Digit  6.2 cm      Left Upper Extremity  Lymphedema   10 cm Proximal to Olecranon Process  31.3 cm    Olecranon Process  27.5 cm    10 cm Proximal to Ulnar Styloid Process  22.6 cm    Just Proximal to Ulnar Styloid Process  16.4 cm    Across Hand at [employer]  19.9 cm    At [place] of 2nd Digit  6 cm          Objective measurements completed on examination: See above findings.             Patient was instructed today in a home exercise program today for post op shoulder range of motion. These included active assist shoulder flexion in sitting, scapular retraction, wall walking with shoulder abduction, and hands behind head external rotation.  She was encouraged to do these twice a day, holding 3 seconds and repeating 5 times when permitted by her physician.     PT Education - 05/22/17 2139    Education provided  Yes    Education Details  Lymphedema risk reduction and post op shoulder ROM HEP    Person(s) Educated  Patient    Methods  Explanation;Demonstration;Handout    Comprehension  Returned demonstration;Verbalized understanding           Breast Clinic Goals - 05/22/17 2146      Patient will be able to verbalize understanding of pertinent lymphedema risk reduction practices relevant to her diagnosis specifically related to skin care.   Time  1    Period  Days    Status  Achieved      Patient will be able to return demonstrate and/or verbalize understanding of the post-op home exercise program related to regaining shoulder range of motion.   Time  1    Period  Days    Status  Achieved      Patient will be able to verbalize understanding of the importance of attending the postoperative After Breast Cancer Class for further lymphedema risk reduction education and therapeutic exercise.  Time  1    Period  Days    Status  Achieved       Long Term Clinic Goals - 05/22/17 2146      CC Long Term Goal  #1   Title  Patient will demonstrate she has regained shoulder ROM and function back to  baseline following surgery at her post-op PT visit.    Time  8    Period  Weeks    Status  New          Plan - 05/22/17 2141    Clinical Impression Statement  Patient was diagnosed on 05/09/17 with left grade 2-3 triple negative invasive ductal carcinoma breast cancer. It measures 2.9 cm and is located in the upper outer quadrant. The Ki67 is 80%. Her multidisciplinary medical team met prior to her assessments to determine a recommended treatment plan. She is planning to have neoadjuvant chemotherapy followed by a left lumpectomy and sentinel node biopsy and radiation. She will benefit from a post op PT visit to reassess and determine PT needs.    History and Personal Factors relevant to plan of care:  None    Clinical Presentation  Stable    Clinical Decision Making  Low    Rehab Potential  Excellent    Clinical Impairments Affecting Rehab Potential  None    PT Frequency  -- Eval and 1 f/u visit   Eval and 1 f/u visit   PT Treatment/Interventions  Patient/family education;ADLs/Self Care Home Management;Therapeutic exercise    PT Next Visit Plan  Will reassess 3-4 weeks post op to determine PT needs    PT Home Exercise Plan  Post op shoulder ROM HEP    Consulted and Agree with Plan of Care  Patient;Family member/caregiver    Family Member Consulted  Husband       Patient will benefit from skilled therapeutic intervention in order to improve the following deficits and impairments:  Decreased knowledge of precautions, Impaired UE functional use, Postural dysfunction, Pain, Decreased range of motion  Visit Diagnosis: Malignant neoplasm of upper-outer quadrant of left breast in female, estrogen receptor negative ([place]) - Plan: PT plan of care cert/re-cert  Abnormal posture - Plan: PT plan of care cert/re-cert  G-Codes - 25/36/64 2147    Functional Assessment Tool Used (Outpatient Only)  Clinical judgement    Functional Limitation  Carrying, moving and handling objects    Carrying,  Moving and Handling Objects Current Status (Q0347)  At least 1 percent but less than 20 percent impaired, limited or restricted    Carrying, Moving and Handling Objects Goal Status (Q2595)  At least 1 percent but less than 20 percent impaired, limited or restricted      Patient will follow up at outpatient cancer rehab 3-4 weeks following surgery.  If the patient requires physical therapy at that time, a specific plan will be dictated and sent to the referring physician for approval. The patient was educated today on appropriate basic range of motion exercises to begin post operatively and the importance of attending the After Breast Cancer class following surgery.  Patient was educated today on lymphedema risk reduction practices as it pertains to recommendations that will benefit the patient immediately following surgery.  She verbalized good understanding.     Problem List Patient Active Problem List   Diagnosis Date Noted  . Fibromyalgia 05/22/2017  . Cerebral hemorrhage ([place]) 05/22/2017  . Malignant neoplasm of upper-outer quadrant of left breast in female, estrogen receptor negative ([place]) 05/21/2017  Annia Friendly, [place] 05/22/17 9:56 PM  [place], [place], [address] Phone: [phone]   Fax:  [phone]  Name: Amy Dawson MRN: [id number] Date of Birth: 29-Mar-1966

## 2017-05-22 NOTE — Progress Notes (Signed)
START ON PATHWAY REGIMEN - Breast     A cycle is every 21 days:     Doxorubicin      Cyclophosphamide      Paclitaxel      Carboplatin   **Always confirm dose/schedule in your pharmacy ordering system**    Patient Characteristics: Preoperative or Nonsurgical Candidate (Clinical Staging), Neoadjuvant Therapy followed by Surgery, Invasive Disease, Chemotherapy, HER2 Negative/Unknown/Equivocal, ER Negative/Unknown, Platinum Therapy Indicated Therapeutic Status: Preoperative or Nonsurgical Candidate (Clinical Staging) AJCC M Category: cM0 AJCC Grade: G3 Breast Surgical Plan: Neoadjuvant Therapy followed by Surgery ER Status: Negative (-) AJCC 8 Stage Grouping: IIB HER2 Status: Negative (-) AJCC T Category: cT2 AJCC N Category: cN0 PR Status: Negative (-) Type of Therapy: Platinum Therapy Indicated Intent of Therapy: Curative Intent, Discussed with Patient 

## 2017-05-22 NOTE — Progress Notes (Signed)
[place]  [phone] [phone]     ID: Amy Dawson DOB: [birth year]  MR#: [id number]  HWE#:[id number]  Patient Care Team: [name], MD as PCP - General (Family Medicine) [name], [name], MD as Consulting Physician (Oncology) [name], MD as Consulting Physician (General Surgery) [name], MD as Consulting Physician (Radiation Oncology) [name], NP as Nurse Practitioner (Obstetrics and Gynecology) Chauncey Cruel, MD OTHER MD:  CHIEF COMPLAINT: Triple negative breast cancer  CURRENT TREATMENT: Neoadjuvant chemotherapy   HISTORY OF CURRENT ILLNESS: Amy Dawson felt some discomfort in the left breast in June 2018, but did not think much of it.  However in October she palpated a change in the upper outer quadrant of her left breast and she brought this to medical attention.  On May 09, 2017 she had bilateral diagnostic mammography with tomography at the breast center, showing the breast density to be category capital C.  In the left breast that was not obscured mass in the upper outer quadrant, which by palpation was identified at the 1230 o'clock radiant 5 cm from the nipple.  By ultrasound this measured 2.9 cm, and it was irregular and hypoechoic.  The left axilla was sonographically benign.  Biopsy of the left breast mass in question May 14, 2017 showed [id number] and invasive ductal carcinoma, grade 2 or more likely 3, estrogen and progesterone receptor negative, with no HER-2 amplification, the signals ratio being 1.18 and the number per cell 1.65.  The key 67 was 80%.  The patient's subsequent history is as detailed below.  INTERVAL HISTORY: [name] was evaluated in the multidisciplinary breast cancer clinic May 22, 2017 accompanied by her husband [name]. Her case was also presented at the multidisciplinary breast cancer conference on the same day. At that time a preliminary plan was proposed: Neoadjuvant  chemotherapy, followed by breast conserving surgery with sentinel lymph node sampling, genetics, and adjuvant radiation   REVIEW OF SYSTEMS: Aside from the mass itself, there were no specific symptoms leading to the original mammogram, which was routinely scheduled.  Amy Dawson did have a significant brain bleed in 2000, attributed to high blood pressure and stress, with residual left-sided body numbness.  The patient denies unusual headaches, visual changes, nausea, vomiting, stiff neck, dizziness, or gait imbalance. There has been no cough, phlegm production, or pleurisy, no chest pain or pressure, and no change in bowel or bladder habits. The patient denies fever, rash, bleeding, unexplained fatigue or unexplained weight loss. A detailed review of systems was otherwise entirely negative.   PAST MEDICAL HISTORY: Past Medical History:  Diagnosis Date  . Allergy   . Bipolar 1 disorder ([place])   . Murmur, cardiac    as a child  . Stroke ([place])   . TMJ (dislocation of temporomandibular joint)     PAST SURGICAL HISTORY: Past Surgical History:  Procedure Laterality Date  . CESAREAN SECTION     2    FAMILY HISTORY Family History  Problem Relation Age of Onset  . Liver cancer Sister   . Breast cancer Maternal Aunt   . Breast cancer Maternal Grandmother   . Colon cancer Neg Hx   . Esophageal cancer Neg Hx   . Rectal cancer Neg Hx   . Stomach cancer Neg Hx   The patient's parents are in their mid 53s as of November 2018.  The patient had 1 brother, 1 sister.  The sister died from a rare liver cancer at age 53.  A maternal aunt and  a paternal grandmother were diagnosed with breast cancer in their 6s.  GYNECOLOGIC HISTORY:  No LMP recorded. Patient is postmenopausal. Menarche age 51.  She underwent endometrial ablation 2011.  She is no longer having periods.  She never used hormone replacement.  First live birth was age 51.  She is GX P2.  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  Her husband [name] works as an Firefighter for [employer]  Daughter Amy Dawson attends [employer].  Son Amy Dawson is doing on apprenticeship in the [employer] program.    ADVANCED DIRECTIVES: Not in place   HEALTH MAINTENANCE: Social History   Tobacco Use  . Smoking status: Former Research scientist (life sciences)  . Smokeless tobacco: Never Used  Substance Use Topics  . Alcohol use: Yes    Alcohol/week: 0.0 oz    Comment: sometimes  . Drug use: Yes    Types: Marijuana    Comment: 3 times weekly- last use 06-02-15     Colonoscopy: 2016/[facility]  PAP: October 2018  Bone density: Never   Allergies  Allergen Reactions  . Other Other (See Comments)    Red wine & white cheese: migraine Raw egg whites- hives    Current Outpatient Medications  Medication Sig Dispense Refill  . amitriptyline (ELAVIL) 50 MG tablet Take 25 mg by mouth at bedtime as needed for sleep.    . Calcium Glycerophosphate (PRELIEF PO) Take 3 tablets by mouth.    . Cetirizine HCl (ZYRTEC ALLERGY PO) Take by mouth daily.    . Homeopathic Products (AZO YEAST PLUS PO) Take by mouth.    . Pseudoephedrine HCl (SUDAFED PO) Take by mouth as needed.    . Pumpkin Seed-Soy Germ (AZO BLADDER CONTROL/GO-LESS) CAPS Take by mouth.     No current facility-administered medications for this visit.     OBJECTIVE: Middle-aged white woman who appears stated age 51:   05/22/17 1254  BP: 130/86  Pulse: 73  Resp: 18  Temp: 98.1 F (36.7 C)  SpO2: 99%     Body mass index is 26.31 kg/m.   Wt Readings from Last 3 Encounters:  05/22/17 176 lb 14.4 oz (80.2 kg)  06/03/15 190 lb (86.2 kg)  05/26/15 190 lb 3.2 oz (86.3 kg)      ECOG FS:1 - Symptomatic but completely ambulatory  Ocular: Sclerae unicteric, pupils round and equal Ear-nose-throat: Oropharynx clear and moist Lymphatic: No cervical or  supraclavicular adenopathy Lungs no rales or rhonchi Heart regular rate and rhythm Abd soft, nontender, positive bowel sounds MSK no focal spinal tenderness, no joint edema Neuro: non-focal, well-oriented, appropriate affect Breasts: The right breast is benign.  The left breast is status post recent biopsy.  There is a palpable mass in the superior aspect of the breast which minimally distorts the breast contour.  There is no associated skin or nipple change.  Both axillae are benign.   LAB RESULTS:  CMP     Component Value Date/Time   NA 141 05/22/2017 1225   K 4.0 05/22/2017 1225   CL 99 05/03/2014 0038   CO2 27 05/22/2017 1225   GLUCOSE 117 05/22/2017 1225   BUN 8.0 05/22/2017 1225   CREATININE 0.8 05/22/2017 1225   CALCIUM 9.1 05/22/2017 1225   PROT 7.1 05/22/2017 1225   ALBUMIN 3.7 05/22/2017 1225   AST 13 05/22/2017 1225   ALT 9 05/22/2017 1225  ALKPHOS 73 05/22/2017 1225   BILITOT 0.30 05/22/2017 1225   GFRNONAA >90 05/03/2014 0038   GFRAA >90 05/03/2014 0038    No results found for: TOTALPROTELP, ALBUMINELP, A1GS, A2GS, BETS, BETA2SER, GAMS, MSPIKE, SPEI  No results found for: Nils Pyle, [facility]  Lab Results  Component Value Date   WBC 4.4 05/22/2017   NEUTROABS 2.4 05/22/2017   HGB 13.4 05/22/2017   HCT 39.9 05/22/2017   MCV 90.6 05/22/2017   PLT 247 05/22/2017      Chemistry      Component Value Date/Time   NA 141 05/22/2017 1225   K 4.0 05/22/2017 1225   CL 99 05/03/2014 0038   CO2 27 05/22/2017 1225   BUN 8.0 05/22/2017 1225   CREATININE 0.8 05/22/2017 1225      Component Value Date/Time   CALCIUM 9.1 05/22/2017 1225   ALKPHOS 73 05/22/2017 1225   AST 13 05/22/2017 1225   ALT 9 05/22/2017 1225   BILITOT 0.30 05/22/2017 1225       No results found for: LABCA2  No components found for: DPOEUM353  No results for input(s): INR in the last 168 hours.  No results found for: LABCA2  No results found for: IRW431  No  results found for: VQM086  No results found for: PYP950  No results found for: CA2729  No components found for: HGQUANT  No results found for: CEA1 / No results found for: CEA1   No results found for: AFPTUMOR  No results found for: CHROMOGRNA  No results found for: PSA1  Appointment on 05/22/2017  Component Date Value Ref Range Status  . Sodium 05/22/2017 141  136 - 145 mEq/L Final  . Potassium 05/22/2017 4.0  3.5 - 5.1 mEq/L Final  . Chloride 05/22/2017 106  98 - 109 mEq/L Final  . CO2 05/22/2017 27  22 - 29 mEq/L Final  . Glucose 05/22/2017 117  70 - 140 mg/dl Final   Glucose reference range is for nonfasting patients. Fasting glucose reference range is 70- 100.  Marland Kitchen BUN 05/22/2017 8.0  7.0 - 26.0 mg/dL Final  . Creatinine 05/22/2017 0.8  0.6 - 1.1 mg/dL Final  . Total Bilirubin 05/22/2017 0.30  0.20 - 1.20 mg/dL Final  . Alkaline Phosphatase 05/22/2017 73  40 - 150 U/L Final  . AST 05/22/2017 13  5 - 34 U/L Final  . ALT 05/22/2017 9  0 - 55 U/L Final  . Total Protein 05/22/2017 7.1  6.4 - 8.3 g/dL Final  . Albumin 05/22/2017 3.7  3.5 - 5.0 g/dL Final  . Calcium 05/22/2017 9.1  8.4 - 10.4 mg/dL Final  . Anion Gap 05/22/2017 8  3 - 11 mEq/L Final  . EGFR 05/22/2017 >60  >60 ml/min/1.73 m2 Final   eGFR is calculated using the CKD-EPI Creatinine Equation (2009)  . WBC 05/22/2017 4.4  3.9 - 10.3 10e3/uL Final  . NEUT# 05/22/2017 2.4  1.5 - 6.5 10e3/uL Final  . HGB 05/22/2017 13.4  11.6 - 15.9 g/dL Final  . HCT 05/22/2017 39.9  34.8 - 46.6 % Final  . Platelets 05/22/2017 247  145 - 400 10e3/uL Final  . MCV 05/22/2017 90.6  79.5 - 101.0 fL Final  . MCH 05/22/2017 30.4  25.1 - 34.0 pg Final  . MCHC 05/22/2017 33.5  31.5 - 36.0 g/dL Final  . RBC 05/22/2017 4.41  3.70 - 5.45 10e6/uL Final  . RDW 05/22/2017 13.3  11.2 - 14.5 % Final  . lymph# 05/22/2017 1.5  0.9 - 3.3 10e3/uL Final  . MONO# 05/22/2017 0.3  0.1 - 0.9 10e3/uL Final  . Eosinophils Absolute 05/22/2017 0.1  0.0 -  0.5 10e3/uL Final  . Basophils Absolute 05/22/2017 0.0  0.0 - 0.1 10e3/uL Final  . NEUT% 05/22/2017 54.9  38.4 - 76.8 % Final  . LYMPH% 05/22/2017 34.8  14.0 - 49.7 % Final  . MONO% 05/22/2017 8.0  0.0 - 14.0 % Final  . EOS% 05/22/2017 1.7  0.0 - 7.0 % Final  . BASO% 05/22/2017 0.6  0.0 - 2.0 % Final    (this displays the last labs from the last 3 days)  No results found for: TOTALPROTELP, ALBUMINELP, A1GS, A2GS, BETS, BETA2SER, GAMS, MSPIKE, SPEI (this displays SPEP labs)  No results found for: KPAFRELGTCHN, LAMBDASER, KAPLAMBRATIO (kappa/lambda light chains)  No results found for: HGBA, HGBA2QUANT, HGBFQUANT, HGBSQUAN (Hemoglobinopathy evaluation)   No results found for: LDH  No results found for: IRON, TIBC, IRONPCTSAT (Iron and TIBC)  No results found for: FERRITIN  Urinalysis    Component Value Date/Time   COLORURINE YELLOW 05/03/2014 0000   APPEARANCEUR CLEAR 05/03/2014 0000   LABSPEC 1.006 05/03/2014 0000   PHURINE 6.0 05/03/2014 0000   GLUCOSEU NEGATIVE 05/03/2014 0000   HGBUR TRACE (A) 05/03/2014 0000   BILIRUBINUR NEGATIVE 05/03/2014 0000   KETONESUR NEGATIVE 05/03/2014 0000   PROTEINUR NEGATIVE 05/03/2014 0000   UROBILINOGEN 0.2 05/03/2014 0000   NITRITE NEGATIVE 05/03/2014 0000   LEUKOCYTESUR TRACE (A) 05/03/2014 0000     STUDIES: US Breast Ltd Uni Left Inc Axilla  Result Date: 05/09/2017 CLINICAL DATA:  51 year old female with palpable upper outer left breast lump identified on self-examination and for annual bilateral mammograms. EXAM: 2D DIGITAL DIAGNOSTIC BILATERAL MAMMOGRAM WITH CAD AND ADJUNCT TOMO ULTRASOUND LEFT BREAST COMPARISON:  Previous exam(s). ACR Breast Density Category c: The breast tissue is heterogeneously dense, which may obscure small masses. FINDINGS: 2D and 3D full field views of both breasts and spot compression view of the left breast demonstrates an obscured mass within the upper-outer left breast. No findings suspicious for  malignancy are identified on the right. Mammographic images were processed with CAD. On physical exam, a firm palpable mass is identified at the 12:30 position of the left breast 5 cm from the nipple Targeted ultrasound is performed, showing a 2.5 x 2.2 x 2.9 cm irregular hypoechoic mass at the 12:30 position of the left breast 5 cm from the nipple. No abnormal left axillary lymph nodes are identified. IMPRESSION: Suspicious 2.5 x 2.2 x 2.9 cm mass in the upper slightly outer left breast. Tissue sampling recommended. RECOMMENDATION: Ultrasound-guided left breast biopsy, which will be arranged. I have discussed the findings and recommendations with the patient. Results were also provided in writing at the conclusion of the visit. If applicable, a reminder letter will be sent to the patient regarding the next appointment. BI-RADS CATEGORY  4: Suspicious. Electronically Signed   By: Margarette Canada M.D.   On: 05/09/2017 16:57   Mm Diag Breast Tomo Bilateral  Result Date: 05/09/2017 CLINICAL DATA:  51 year old female with palpable upper outer left breast lump identified on self-examination and for annual bilateral mammograms. EXAM: 2D DIGITAL DIAGNOSTIC BILATERAL MAMMOGRAM WITH CAD AND ADJUNCT TOMO ULTRASOUND LEFT BREAST COMPARISON:  Previous exam(s). ACR Breast Density Category c: The breast tissue is heterogeneously dense, which may obscure small masses. FINDINGS: 2D and 3D full field views of both breasts and spot compression view of the left breast demonstrates an obscured mass within the  upper-outer left breast. No findings suspicious for malignancy are identified on the right. Mammographic images were processed with CAD. On physical exam, a firm palpable mass is identified at the 12:30 position of the left breast 5 cm from the nipple Targeted ultrasound is performed, showing a 2.5 x 2.2 x 2.9 cm irregular hypoechoic mass at the 12:30 position of the left breast 5 cm from the nipple. No abnormal left axillary  lymph nodes are identified. IMPRESSION: Suspicious 2.5 x 2.2 x 2.9 cm mass in the upper slightly outer left breast. Tissue sampling recommended. RECOMMENDATION: Ultrasound-guided left breast biopsy, which will be arranged. I have discussed the findings and recommendations with the patient. Results were also provided in writing at the conclusion of the visit. If applicable, a reminder letter will be sent to the patient regarding the next appointment. BI-RADS CATEGORY  4: Suspicious. Electronically Signed   By: Margarette Canada M.D.   On: 05/09/2017 16:57   Mm Clip Placement Left  Result Date: 05/14/2017 CLINICAL DATA:  Confirmation of clip placement after ultrasound-guided core needle biopsy of a highly suspicious mass in the upper outer quadrant of the left breast. EXAM: DIAGNOSTIC LEFT MAMMOGRAM POST ULTRASOUND BIOPSY COMPARISON:  Previous exam(s). FINDINGS: Mammographic images were obtained following ultrasound guided biopsy of a mass in the upper outer quadrant of the left breast. Of note, the mass was so firm that the introducer needle for the biopsy clip would not enter the mass and started to bend, so the clip was deployed just anterior to the mass. The ribbon shaped tissue marker clip is positioned approximately 5-6 mm anterior to the mass, corresponding to the site of deployment. Expected post biopsy changes are present without evidence of hematoma. IMPRESSION: Positioning of the ribbon shaped tissue marker clip approximately 5-6 mm anterior to the mass at the site of clip deployment - the mass was so firm that the introducer needle for the clip would not enter the mass without bending, so the clip was deployed anterior to the mass. Final Assessment: Post Procedure Mammograms for Marker Placement Electronically Signed   By: Evangeline Dakin M.D.   On: 05/14/2017 15:44   [place] Lt Breast Bx W Loc Dev 1st Lesion Img Bx Spec US Guide  Addendum Date: 05/16/2017   ADDENDUM REPORT: 05/16/2017 15:22 ADDENDUM:  Pathology revealed GRADE II-III INVASIVE DUCTAL CARCINOMA of the Left breast, upper outer quadrant, 12:30 o'clock, 5 cm from the nipple. This was found to be concordant by Dr. Peggye Fothergill. Pathology results were discussed with the patient by telephone. The patient reported doing well after the biopsy with tenderness at the site. Post biopsy instructions and care were reviewed and questions were answered. The patient was encouraged to call [facility] for any additional concerns. The patient was referred to [facility] on May 22, 2017. Pathology results reported by Terie Purser, RN on 05/16/2017. Electronically Signed   By: Evangeline Dakin M.D.   On: 05/16/2017 15:22   Result Date: 05/16/2017 CLINICAL DATA:  51 year old who presented with a palpable mass in the upper left breast, shown on diagnostic workup to have a highly suspicious 2.9 cm mass in the upper outer quadrant at the 12:30 o'clock position approximately 5 cm from the nipple. EXAM: ULTRASOUND GUIDED LEFT BREAST CORE NEEDLE BIOPSY COMPARISON:  Previous exam(s). FINDINGS: I met with the patient and we discussed the procedure of ultrasound-guided biopsy, including benefits and alternatives. We discussed  the high likelihood of a successful procedure. We discussed the risks of the procedure, including infection, bleeding, tissue injury, clip migration, and inadequate sampling. Informed written consent was given. The usual time-out protocol was performed immediately prior to the procedure. Lesion quadrant: Upper outer quadrant. Using sterile technique with chlorhexidine as skin antisepsis, 1% Lidocaine as local anesthetic, under direct ultrasound visualization, a 12 gauge Bard Marquee core needle device was used to perform biopsy of the 2.9 cm mass in the upper outer quadrant at the 12:30 o'clock position approximately 5 cm from the nipple using a  lateral approach. At the conclusion of the procedure a ribbon tissue marker clip was deployed into the biopsy cavity. Follow up 2 view mammogram was performed and dictated separately. IMPRESSION: Ultrasound guided biopsy of a 2.9 cm mass in the upper outer quadrant of the left breast. No apparent complications. Electronically Signed: By: Evangeline Dakin M.D. On: 05/14/2017 15:42    ELIGIBLE FOR AVAILABLE RESEARCH PROTOCOL: SWOG 1638 depending on response to neoadjuvant treatment, UPBEAT  ASSESSMENT: 51 y.o. [place] status post left breast upper outer quadrant biopsy May 14, 2017 for a clinical T2 N0, stage IIB invasive ductal carcinoma, grade 3, triple negative, with an MIB-1 of 80%  (1) neoadjuvant chemotherapy will consist of doxorubicin and cyclophosphamide in dose dense fashion x4 starting June 04, 2017, to be followed by weekly carboplatin and paclitaxel x12  (2) breast conserving surgery with sentinel lymph node sampling to follow  (3) adjuvant radiation to follow surgery  (4) genetics testing pending  PLAN: We spent the better part of today's hour-long appointment discussing the biology of her diagnosis and the specifics of her situation. We first reviewed the fact that cancer is not one disease but more than 100 different diseases and that it is important to keep them separate-- otherwise when friends and relatives discuss their own cancer experiences with Parthenia confusion can result. Similarly we explained that if breast cancer spreads to the bone or liver, the patient would not have bone cancer or liver cancer, but breast cancer in the bone and breast cancer in the liver: one cancer in three places-- not 3 different cancers which otherwise would have to be treated in 3 different ways.  We discussed the difference between local and systemic therapy. In terms of loco-regional treatment, lumpectomy plus radiation is equivalent to mastectomy as far as survival is concerned. For this  reason, and because the cosmetic results are generally superior, we recommend breast conserving surgery. We also noted that in terms of sequencing of treatments, whether systemic therapy or surgery is done first does not affect the ultimate outcome: This of course is relevant to Lori's case.  We then discussed the rationale for systemic therapy. There is some risk that this cancer may have already spread to other parts of her body. Patients frequently ask at this point about bone scans, CAT scans and PET scans to find out if they have occult breast cancer somewhere else. The problem is that in early stage disease we are much more likely to find false positives then true cancers and this would expose the patient to unnecessary procedures as well as unnecessary radiation. Scans cannot answer the question the patient really would like to know, which is whether she has microscopic disease elsewhere in her body. For those reasons we do not recommend them.  Of course we would proceed to aggressive evaluation of any symptoms that might suggest metastatic disease, but that is not the case here.  Next we went over the options for systemic therapy which are anti-estrogens, anti-HER-2 immunotherapy, and chemotherapy. [place]a does not meet criteria for anti-HER-2 immunotherapy or anti-estrogens.  The only option for systemic therapy is chemotherapy and accordingly that is what we recommend.  More specifically we discussed cyclophosphamide and doxorubicin in dose dense fashion followed by carboplatin and paclitaxel weekly.  She is aware of the possible toxicity side effects and complications of these agents and we will review this again at "chemotherapy school".  She will need an echocardiogram and a port.  She will then return to see me 05/31/2017 with a view to starting chemotherapy 06/04/2017.  Once she completes her chemotherapy treatments she will proceed to definitive surgery and then adjuvant radiation.  A  referral to the UPBEAT trial was placed today  Lanesha has a good understanding of the overall plan. She agrees with it. She knows the goal of treatment in her case is cure. She will call with any problems that may develop before her next visit here.  Chauncey Cruel, MD   05/22/2017 5:04 PM Medical Oncology and Hematology [facility] [address] [place],  [address] Tel. [phone]    Fax. [phone]

## 2017-05-23 ENCOUNTER — Other Ambulatory Visit: Payer: Self-pay | Admitting: General Surgery

## 2017-05-24 ENCOUNTER — Telehealth: Payer: Self-pay | Admitting: *Deleted

## 2017-05-24 ENCOUNTER — Other Ambulatory Visit: Payer: Self-pay | Admitting: Oncology

## 2017-05-24 ENCOUNTER — Encounter: Payer: Self-pay | Admitting: General Practice

## 2017-05-24 DIAGNOSIS — C50919 Malignant neoplasm of unspecified site of unspecified female breast: Secondary | ICD-10-CM

## 2017-05-24 NOTE — Progress Notes (Signed)
[place] Psychosocial Distress Screening Spiritual Care  LVM for Hidaya following Breast Multidisciplinary Clinic to introduce Support Center team/resources, reviewing distress screen per protocol, and to axssess for distress and other psychosocial needs.  The patient scored a 8 on the Psychosocial Distress Thermometer which indicates severe distress.  ONCBCN DISTRESS SCREENING 05/24/2017  Screening Type Initial Screening  Distress experienced in past week (1-10) 8  Practical problem type Insurance  Emotional problem type Depression;Adjusting to illness;Isolation/feeling alone  Spiritual/Religous concerns type Facing my mortality  Physical Problem type Sleep/insomnia  Referral to support programs Yes    Follow up needed: Yes.  Encouraged pt to return call, but plan to f/u by phone if pt does not call back.   [place], [place], [facility] Pager [phone] Voicemail [phone]

## 2017-05-24 NOTE — Telephone Encounter (Signed)
FMLA left on this RN's desk from pt's husband ( not given directly to this nurse ).  FMLA intake form completed and placed in pick up box.

## 2017-05-27 ENCOUNTER — Other Ambulatory Visit: Payer: Self-pay | Admitting: *Deleted

## 2017-05-27 ENCOUNTER — Telehealth: Payer: Self-pay | Admitting: *Deleted

## 2017-05-27 ENCOUNTER — Other Ambulatory Visit: Payer: Self-pay

## 2017-05-27 ENCOUNTER — Encounter (HOSPITAL_BASED_OUTPATIENT_CLINIC_OR_DEPARTMENT_OTHER): Payer: Self-pay | Admitting: *Deleted

## 2017-05-27 DIAGNOSIS — C50412 Malignant neoplasm of upper-outer quadrant of left female breast: Secondary | ICD-10-CM

## 2017-05-27 DIAGNOSIS — Z171 Estrogen receptor negative status [ER-]: Principal | ICD-10-CM

## 2017-05-27 NOTE — Telephone Encounter (Signed)
Spoke to pt regarding [place] from 05/22/17. Denies questions or concerns regarding dx or treatment care plan. Discussed next steps and future appts. Informed pt she does not need to see Dr. [name] on 11/16. Received verbal understanding.

## 2017-05-27 NOTE — Telephone Encounter (Signed)
Left vm for pt to return call regarding [place] from 05/22/17. Contact information provided.

## 2017-05-28 ENCOUNTER — Telehealth: Payer: Self-pay | Admitting: Oncology

## 2017-05-28 NOTE — Telephone Encounter (Signed)
Scheduled additional appts per 11/12 sch message - left message for patient

## 2017-05-29 ENCOUNTER — Encounter: Payer: Self-pay | Admitting: *Deleted

## 2017-05-29 ENCOUNTER — Ambulatory Visit
Admission: RE | Admit: 2017-05-29 | Discharge: 2017-05-29 | Disposition: A | Discharge status: Home / Self Care | Payer: Commercial Managed Care - HMO | Source: Ambulatory Visit | Attending: Oncology | Admitting: Oncology

## 2017-05-29 ENCOUNTER — Other Ambulatory Visit: Payer: Self-pay | Admitting: Oncology

## 2017-05-29 ENCOUNTER — Telehealth: Payer: Self-pay | Admitting: *Deleted

## 2017-05-29 DIAGNOSIS — C50412 Malignant neoplasm of upper-outer quadrant of left female breast: Secondary | ICD-10-CM

## 2017-05-29 DIAGNOSIS — Z171 Estrogen receptor negative status [ER-]: Principal | ICD-10-CM

## 2017-05-29 MED ORDER — GADOBENATE DIMEGLUMINE 529 MG/ML IV SOLN
16.0000 mL | Freq: Once | INTRAVENOUS | Status: AC | PRN
  Administered 2017-05-29: 16 mL via INTRAVENOUS

## 2017-05-29 NOTE — Progress Notes (Signed)
Ensure pre surgery drink given with instructions to complete by 0800 dos, pt  Verbalized understanding.

## 2017-05-29 NOTE — Telephone Encounter (Signed)
This RN received request from research nurse - a note from Dr Jannifer Rodney stating pt needs an [place] of her left axilla- and requested to discuss at visit with research tomorrow.  This RN noted area of concern per MRI of breast obtained today.  Called pt per number on demographic page- and obtained VM- message left to return call to this RN.

## 2017-05-30 ENCOUNTER — Other Ambulatory Visit: Payer: 59

## 2017-05-30 ENCOUNTER — Ambulatory Visit (HOSPITAL_COMMUNITY)
Admission: RE | Admit: 2017-05-30 | Discharge: 2017-05-30 | Disposition: A | Discharge status: Home / Self Care | Payer: 59 | Source: Ambulatory Visit | Attending: Oncology | Admitting: Oncology

## 2017-05-30 ENCOUNTER — Other Ambulatory Visit: Payer: Self-pay | Admitting: *Deleted

## 2017-05-30 ENCOUNTER — Telehealth: Payer: Self-pay | Admitting: *Deleted

## 2017-05-30 ENCOUNTER — Encounter: Payer: Self-pay | Admitting: *Deleted

## 2017-05-30 ENCOUNTER — Ambulatory Visit (HOSPITAL_BASED_OUTPATIENT_CLINIC_OR_DEPARTMENT_OTHER): Payer: 59

## 2017-05-30 DIAGNOSIS — Z171 Estrogen receptor negative status [ER-]: Secondary | ICD-10-CM | POA: Diagnosis not present

## 2017-05-30 DIAGNOSIS — C50412 Malignant neoplasm of upper-outer quadrant of left female breast: Secondary | ICD-10-CM

## 2017-05-30 DIAGNOSIS — R309 Painful micturition, unspecified: Secondary | ICD-10-CM | POA: Diagnosis not present

## 2017-05-30 DIAGNOSIS — C50919 Malignant neoplasm of unspecified site of unspecified female breast: Secondary | ICD-10-CM

## 2017-05-30 LAB — URINALYSIS, MICROSCOPIC - CHCC
Glucose: NEGATIVE mg/dL
RBC / HPF: NEGATIVE (ref 0–2)
Specific Gravity, Urine: 1.01 (ref 1.003–1.035)
WBC, UA: NEGATIVE (ref 0–?)

## 2017-05-30 NOTE — Progress Notes (Signed)
  Echocardiogram 2D Echocardiogram has been performed.  Amy Dawson 05/30/2017, 9:25 AM

## 2017-05-30 NOTE — Telephone Encounter (Signed)
This RN spoke with pt per U/A results - negative for WBC.  Amy Dawson states urinary pain occurs at end of urination and then she has urge to go but only passes " drops" of urine.  Only recent change is Amy Dawson has been taking AZO standard Bladder Control/ Go Less for urinary balance.  She stopped per MD visit due to noted ingredients of unknown soy amount.  Above reviewed with MD- and he recommends for the patient to resume the above supplement presently - evaluate for improvement. Further use or alternatives can be discussed at the next MD visit.  This RN called pt and discussed the above.  She will resume her supplement and understands to call if symptoms do not improve or she develops fever or chills.

## 2017-05-31 ENCOUNTER — Ambulatory Visit (HOSPITAL_COMMUNITY): Payer: 59

## 2017-05-31 ENCOUNTER — Encounter (HOSPITAL_BASED_OUTPATIENT_CLINIC_OR_DEPARTMENT_OTHER): Payer: Self-pay | Admitting: *Deleted

## 2017-05-31 ENCOUNTER — Encounter (HOSPITAL_BASED_OUTPATIENT_CLINIC_OR_DEPARTMENT_OTHER)
Admission: RE | Disposition: A | Discharge status: Home / Self Care | Payer: Self-pay | Source: Ambulatory Visit | Attending: General Surgery

## 2017-05-31 ENCOUNTER — Ambulatory Visit (HOSPITAL_BASED_OUTPATIENT_CLINIC_OR_DEPARTMENT_OTHER)
Admission: RE | Admit: 2017-05-31 | Discharge: 2017-05-31 | Disposition: A | Discharge status: Home / Self Care | Payer: 59 | Source: Ambulatory Visit | Attending: General Surgery | Admitting: General Surgery

## 2017-05-31 ENCOUNTER — Ambulatory Visit: Payer: 59 | Admitting: Oncology

## 2017-05-31 ENCOUNTER — Other Ambulatory Visit: Payer: Self-pay

## 2017-05-31 ENCOUNTER — Other Ambulatory Visit: Payer: Self-pay | Admitting: *Deleted

## 2017-05-31 ENCOUNTER — Ambulatory Visit (HOSPITAL_BASED_OUTPATIENT_CLINIC_OR_DEPARTMENT_OTHER): Payer: 59 | Admitting: Certified Registered"

## 2017-05-31 ENCOUNTER — Telehealth: Payer: Self-pay

## 2017-05-31 DIAGNOSIS — Z419 Encounter for procedure for purposes other than remedying health state, unspecified: Secondary | ICD-10-CM

## 2017-05-31 DIAGNOSIS — C50412 Malignant neoplasm of upper-outer quadrant of left female breast: Secondary | ICD-10-CM | POA: Diagnosis present

## 2017-05-31 DIAGNOSIS — F329 Major depressive disorder, single episode, unspecified: Secondary | ICD-10-CM | POA: Insufficient documentation

## 2017-05-31 DIAGNOSIS — Z79891 Long term (current) use of opiate analgesic: Secondary | ICD-10-CM | POA: Diagnosis not present

## 2017-05-31 DIAGNOSIS — M797 Fibromyalgia: Secondary | ICD-10-CM | POA: Diagnosis not present

## 2017-05-31 DIAGNOSIS — Z171 Estrogen receptor negative status [ER-]: Principal | ICD-10-CM

## 2017-05-31 DIAGNOSIS — Z87891 Personal history of nicotine dependence: Secondary | ICD-10-CM | POA: Insufficient documentation

## 2017-05-31 DIAGNOSIS — Z8673 Personal history of transient ischemic attack (TIA), and cerebral infarction without residual deficits: Secondary | ICD-10-CM | POA: Diagnosis not present

## 2017-05-31 DIAGNOSIS — Z95828 Presence of other vascular implants and grafts: Secondary | ICD-10-CM

## 2017-05-31 DIAGNOSIS — F419 Anxiety disorder, unspecified: Secondary | ICD-10-CM | POA: Diagnosis not present

## 2017-05-31 HISTORY — DX: Chronic kidney disease, unspecified: N18.9

## 2017-05-31 HISTORY — DX: Anxiety disorder, unspecified: F41.9

## 2017-05-31 HISTORY — DX: Fibromyalgia: M79.7

## 2017-05-31 HISTORY — DX: Major depressive disorder, single episode, unspecified: F32.9

## 2017-05-31 HISTORY — PX: PORTACATH PLACEMENT: SHX2246

## 2017-05-31 HISTORY — DX: Headache: R51

## 2017-05-31 SURGERY — INSERTION, TUNNELED CENTRAL VENOUS DEVICE, WITH PORT
Anesthesia: General | Site: Chest | Laterality: Right

## 2017-05-31 MED ORDER — MEPERIDINE HCL 25 MG/ML IJ SOLN: 6.2500 mg | INTRAMUSCULAR | Status: DC | PRN

## 2017-05-31 MED ORDER — FENTANYL CITRATE (PF) 100 MCG/2ML IJ SOLN
INTRAMUSCULAR | Status: AC
  Filled 2017-05-31: qty 2

## 2017-05-31 MED ORDER — HEPARIN SOD (PORK) LOCK FLUSH 100 UNIT/ML IV SOLN
INTRAVENOUS | Status: AC
  Filled 2017-05-31: qty 5

## 2017-05-31 MED ORDER — LACTATED RINGERS IV SOLN
INTRAVENOUS | Status: DC
  Administered 2017-05-31: 11:00:00 via INTRAVENOUS

## 2017-05-31 MED ORDER — DEXAMETHASONE SODIUM PHOSPHATE 10 MG/ML IJ SOLN
INTRAMUSCULAR | Status: AC
  Filled 2017-05-31: qty 1

## 2017-05-31 MED ORDER — TRAMADOL HCL 50 MG PO TABS
ORAL_TABLET | ORAL | Status: AC
  Filled 2017-05-31: qty 1

## 2017-05-31 MED ORDER — ONDANSETRON HCL 4 MG/2ML IJ SOLN
INTRAMUSCULAR | Status: DC | PRN
  Administered 2017-05-31: 4 mg via INTRAVENOUS

## 2017-05-31 MED ORDER — CEFAZOLIN SODIUM-DEXTROSE 2-4 GM/100ML-% IV SOLN
INTRAVENOUS | Status: AC
  Filled 2017-05-31: qty 100

## 2017-05-31 MED ORDER — TRAMADOL HCL 50 MG PO TABS
50.0000 mg | ORAL_TABLET | Freq: Once | ORAL | Status: AC
  Administered 2017-05-31: 50 mg via ORAL

## 2017-05-31 MED ORDER — GABAPENTIN 300 MG PO CAPS
300.0000 mg | ORAL_CAPSULE | ORAL | Status: AC
  Administered 2017-05-31: 300 mg via ORAL

## 2017-05-31 MED ORDER — BUPIVACAINE HCL (PF) 0.25 % IJ SOLN
INTRAMUSCULAR | Status: AC
  Filled 2017-05-31: qty 30

## 2017-05-31 MED ORDER — LIDOCAINE 2% (20 MG/ML) 5 ML SYRINGE
INTRAMUSCULAR | Status: AC
  Filled 2017-05-31: qty 5

## 2017-05-31 MED ORDER — ACETAMINOPHEN 500 MG PO TABS
1000.0000 mg | ORAL_TABLET | ORAL | Status: AC
  Administered 2017-05-31: 1000 mg via ORAL

## 2017-05-31 MED ORDER — PROPOFOL 10 MG/ML IV BOLUS
INTRAVENOUS | Status: AC
  Filled 2017-05-31: qty 20

## 2017-05-31 MED ORDER — FENTANYL CITRATE (PF) 100 MCG/2ML IJ SOLN
25.0000 ug | INTRAMUSCULAR | Status: DC | PRN
  Administered 2017-05-31: 100 ug via INTRAVENOUS
  Administered 2017-05-31: 50 ug via INTRAVENOUS
  Administered 2017-05-31: 25 ug via INTRAVENOUS
  Administered 2017-05-31: 50 ug via INTRAVENOUS

## 2017-05-31 MED ORDER — HEPARIN SOD (PORK) LOCK FLUSH 100 UNIT/ML IV SOLN
INTRAVENOUS | Status: DC | PRN
  Administered 2017-05-31: 400 [IU] via INTRAVENOUS

## 2017-05-31 MED ORDER — FENTANYL CITRATE (PF) 100 MCG/2ML IJ SOLN: 50.0000 ug | INTRAMUSCULAR | Status: DC | PRN

## 2017-05-31 MED ORDER — GABAPENTIN 300 MG PO CAPS
ORAL_CAPSULE | ORAL | Status: AC
  Filled 2017-05-31: qty 1

## 2017-05-31 MED ORDER — METOCLOPRAMIDE HCL 5 MG/ML IJ SOLN: 10.0000 mg | Freq: Once | INTRAMUSCULAR | Status: DC | PRN

## 2017-05-31 MED ORDER — ACETAMINOPHEN 500 MG PO TABS
ORAL_TABLET | ORAL | Status: AC
  Filled 2017-05-31: qty 2

## 2017-05-31 MED ORDER — ONDANSETRON HCL 4 MG/2ML IJ SOLN
INTRAMUSCULAR | Status: AC
  Filled 2017-05-31: qty 2

## 2017-05-31 MED ORDER — MIDAZOLAM HCL 2 MG/2ML IJ SOLN
INTRAMUSCULAR | Status: AC
  Filled 2017-05-31: qty 2

## 2017-05-31 MED ORDER — CELECOXIB 200 MG PO CAPS
200.0000 mg | ORAL_CAPSULE | ORAL | Status: AC
  Administered 2017-05-31: 200 mg via ORAL

## 2017-05-31 MED ORDER — HEPARIN (PORCINE) IN NACL 2-0.9 UNIT/ML-% IJ SOLN
INTRAMUSCULAR | Status: AC
  Filled 2017-05-31: qty 500

## 2017-05-31 MED ORDER — SCOPOLAMINE 1 MG/3DAYS TD PT72: 1.0000 | MEDICATED_PATCH | Freq: Once | TRANSDERMAL | Status: DC | PRN

## 2017-05-31 MED ORDER — IOPAMIDOL (ISOVUE-300) INJECTION 61%
INTRAVENOUS | Status: AC
  Filled 2017-05-31: qty 50

## 2017-05-31 MED ORDER — MIDAZOLAM HCL 2 MG/2ML IJ SOLN
1.0000 mg | INTRAMUSCULAR | Status: DC | PRN
  Administered 2017-05-31: 2 mg via INTRAVENOUS

## 2017-05-31 MED ORDER — DEXAMETHASONE SODIUM PHOSPHATE 4 MG/ML IJ SOLN
INTRAMUSCULAR | Status: DC | PRN
  Administered 2017-05-31: 10 mg via INTRAVENOUS

## 2017-05-31 MED ORDER — CEFAZOLIN SODIUM-DEXTROSE 2-4 GM/100ML-% IV SOLN
2.0000 g | INTRAVENOUS | Status: AC
  Administered 2017-05-31: 2 g via INTRAVENOUS

## 2017-05-31 MED ORDER — PROPOFOL 10 MG/ML IV BOLUS
INTRAVENOUS | Status: DC | PRN
  Administered 2017-05-31: 80 mg via INTRAVENOUS
  Administered 2017-05-31: 20 mg via INTRAVENOUS

## 2017-05-31 MED ORDER — LIDOCAINE 2% (20 MG/ML) 5 ML SYRINGE
INTRAMUSCULAR | Status: DC | PRN
  Administered 2017-05-31: 30 mg via INTRAVENOUS

## 2017-05-31 MED ORDER — HYDROCODONE-ACETAMINOPHEN 7.5-325 MG PO TABS: 1.0000 | ORAL_TABLET | Freq: Once | ORAL | Status: DC | PRN

## 2017-05-31 MED ORDER — BUPIVACAINE HCL (PF) 0.25 % IJ SOLN
INTRAMUSCULAR | Status: DC | PRN
  Administered 2017-05-31: 8 mL

## 2017-05-31 MED ORDER — SCOPOLAMINE 1 MG/3DAYS TD PT72
1.0000 | MEDICATED_PATCH | TRANSDERMAL | Status: DC
  Administered 2017-05-31: 1.5 mg via TRANSDERMAL

## 2017-05-31 MED ORDER — HEPARIN (PORCINE) IN NACL 2-0.9 UNIT/ML-% IJ SOLN
INTRAMUSCULAR | Status: AC | PRN
  Administered 2017-05-31: 1 via INTRAVENOUS

## 2017-05-31 MED ORDER — TRAMADOL HCL 50 MG PO TABS: 100.0000 mg | ORAL_TABLET | Freq: Four times a day (QID) | ORAL | 0 refills | Status: DC | PRN

## 2017-05-31 MED ORDER — CELECOXIB 200 MG PO CAPS
ORAL_CAPSULE | ORAL | Status: AC
  Filled 2017-05-31: qty 1

## 2017-05-31 SURGICAL SUPPLY — 54 items
BAG DECANTER FOR FLEXI CONT (MISCELLANEOUS) ×3 IMPLANT
BENZOIN TINCTURE PRP APPL 2/3 (GAUZE/BANDAGES/DRESSINGS) IMPLANT
BLADE SURG 11 STRL SS (BLADE) ×3 IMPLANT
BLADE SURG 15 STRL LF DISP TIS (BLADE) ×1 IMPLANT
BLADE SURG 15 STRL SS (BLADE) ×2
CANISTER SUCT 1200ML W/VALVE (MISCELLANEOUS) IMPLANT
CHLORAPREP W/TINT 26ML (MISCELLANEOUS) ×3 IMPLANT
CLOSURE WOUND 1/2 X4 (GAUZE/BANDAGES/DRESSINGS)
COVER BACK TABLE 60X90IN (DRAPES) ×3 IMPLANT
COVER MAYO STAND STRL (DRAPES) ×3 IMPLANT
COVER PROBE 5X48 (MISCELLANEOUS) ×2
DECANTER SPIKE VIAL GLASS SM (MISCELLANEOUS) IMPLANT
DERMABOND ADVANCED (GAUZE/BANDAGES/DRESSINGS) ×2
DERMABOND ADVANCED .7 DNX12 (GAUZE/BANDAGES/DRESSINGS) ×1 IMPLANT
DRAPE C-ARM 42X72 X-RAY (DRAPES) ×3 IMPLANT
DRAPE LAPAROSCOPIC ABDOMINAL (DRAPES) ×3 IMPLANT
DRAPE UTILITY XL STRL (DRAPES) ×3 IMPLANT
DRSG TEGADERM 4X4.75 (GAUZE/BANDAGES/DRESSINGS) IMPLANT
ELECT COATED BLADE 2.86 ST (ELECTRODE) ×3 IMPLANT
ELECT REM PT RETURN 9FT ADLT (ELECTROSURGICAL) ×3
ELECTRODE REM PT RTRN 9FT ADLT (ELECTROSURGICAL) ×1 IMPLANT
GAUZE SPONGE 4X4 12PLY STRL LF (GAUZE/BANDAGES/DRESSINGS) ×3 IMPLANT
GLOVE BIO SURGEON STRL SZ 6.5 (GLOVE) ×4 IMPLANT
GLOVE BIO SURGEON STRL SZ7 (GLOVE) ×3 IMPLANT
GLOVE BIO SURGEONS STRL SZ 6.5 (GLOVE) ×2
GLOVE BIOGEL PI IND STRL 6.5 (GLOVE) ×1 IMPLANT
GLOVE BIOGEL PI IND STRL 7.5 (GLOVE) ×1 IMPLANT
GLOVE BIOGEL PI INDICATOR 6.5 (GLOVE) ×2
GLOVE BIOGEL PI INDICATOR 7.5 (GLOVE) ×2
GOWN STRL REUS W/ TWL LRG LVL3 (GOWN DISPOSABLE) ×3 IMPLANT
GOWN STRL REUS W/TWL LRG LVL3 (GOWN DISPOSABLE) ×6
IV KIT MINILOC 20X1 SAFETY (NEEDLE) IMPLANT
KIT CVR 48X5XPRB PLUP LF (MISCELLANEOUS) ×1 IMPLANT
KIT PORT POWER 8FR ISP CVUE (Miscellaneous) ×3 IMPLANT
NDL SAFETY ECLIPSE 18X1.5 (NEEDLE) IMPLANT
NEEDLE HYPO 18GX1.5 SHARP (NEEDLE)
NEEDLE HYPO 25X1 1.5 SAFETY (NEEDLE) ×3 IMPLANT
PACK BASIN DAY SURGERY FS (CUSTOM PROCEDURE TRAY) ×3 IMPLANT
PENCIL BUTTON HOLSTER BLD 10FT (ELECTRODE) ×3 IMPLANT
SLEEVE SCD COMPRESS KNEE MED (MISCELLANEOUS) ×3 IMPLANT
STRIP CLOSURE SKIN 1/2X4 (GAUZE/BANDAGES/DRESSINGS) IMPLANT
SUT MON AB 4-0 PC3 18 (SUTURE) ×3 IMPLANT
SUT PROLENE 2 0 SH DA (SUTURE) ×6 IMPLANT
SUT SILK 2 0 TIES 17X18 (SUTURE)
SUT SILK 2-0 18XBRD TIE BLK (SUTURE) IMPLANT
SUT VIC AB 3-0 SH 27 (SUTURE) ×2
SUT VIC AB 3-0 SH 27X BRD (SUTURE) ×1 IMPLANT
SYR 5ML LUER SLIP (SYRINGE) ×3 IMPLANT
SYR CONTROL 10ML LL (SYRINGE) ×3 IMPLANT
TOWEL OR 17X24 6PK STRL BLUE (TOWEL DISPOSABLE) ×3 IMPLANT
TOWEL OR NON WOVEN STRL DISP B (DISPOSABLE) ×3 IMPLANT
TUBE CONNECTING 20'X1/4 (TUBING)
TUBE CONNECTING 20X1/4 (TUBING) IMPLANT
YANKAUER SUCT BULB TIP NO VENT (SUCTIONS) IMPLANT

## 2017-05-31 NOTE — Telephone Encounter (Signed)
Spoke with Aaron Edelman, RN from research who states pt has called back c/o continued urinary symptoms.  This RN called and lvm at number provided for pt reiterating instructions provided to her yesterday and also instructing her to call on Monday morning if symptoms do not improve over the weekend.

## 2017-05-31 NOTE — Anesthesia Postprocedure Evaluation (Signed)
Anesthesia Post Note  Patient: Amy Dawson  Procedure(s) Performed: INSERTION PORT-A-CATH WITH [place] (Right Chest)     Patient location during evaluation: PACU Anesthesia Type: General Level of consciousness: awake and alert and oriented Pain management: pain level controlled Vital Signs Assessment: post-procedure vital signs reviewed and stable Respiratory status: spontaneous breathing, nonlabored ventilation and respiratory function stable Cardiovascular status: blood pressure returned to baseline and stable Postop Assessment: no apparent nausea or vomiting Anesthetic complications: no    Last Vitals:  Vitals:   05/31/17 1400 05/31/17 1415  BP: 120/90 116/77  Pulse: 67 72  Resp: 10 11  Temp:    SpO2: 98% 97%    Last Pain:  Vitals:   05/31/17 1415  TempSrc:   PainSc: 6                  [name]

## 2017-05-31 NOTE — Discharge Instructions (Signed)
PORT-A-CATH: POST OP INSTRUCTIONS  Always review your discharge instruction sheet given to you by the facility where your surgery was performed.   1. A prescription for pain medication may be given to you upon discharge. Take your pain medication as prescribed, if needed. If narcotic pain medicine is not needed, then you make take acetaminophen (Tylenol) or ibuprofen (Advil) as needed.  2. Take your usually prescribed medications unless otherwise directed. 3. If you need a refill on your pain medication, please contact our office. All narcotic pain medicine now requires a paper prescription.  Phoned in and fax refills are no longer allowed by law.  Prescriptions will not be filled after 5 pm or on weekends.  4. You should follow a light diet for the remainder of the day after your procedure. 5. Most patients will experience some mild swelling and/or bruising in the area of the incision. It may take several days to resolve. 6. It is common to experience some constipation if taking pain medication after surgery. Increasing fluid intake and taking a stool softener (such as Colace) will usually help or prevent this problem from occurring. A mild laxative (Milk of Magnesia or Miralax) should be taken according to package directions if there are no bowel movements after 48 hours.  7. Unless discharge instructions indicate otherwise, you may remove your bandages 48 hours after surgery, and you may shower at that time. You may have steri-strips (small white skin tapes) in place directly over the incision.  These strips should be left on the skin for 7-10 days.  If your surgeon used Dermabond (skin glue) on the incision, you may shower in 24 hours.  The glue will flake off over the next 2-3 weeks.  8. ACTIVITIES:  Limit activity involving your arms for the next 72 hours. Do no strenuous exercise or activity for 1 week. You may drive when you are no longer taking prescription pain medication, you can  comfortably wear a seatbelt, and you can maneuver your car. 10.You may need to see your doctor in the office for a follow-up appointment.  Please       check with your doctor.    WHEN TO CALL YOUR DOCTOR [phone]): 1. Fever over 101.0 2. Chills 3. Continued bleeding from incision 4. Increased redness and tenderness at the site 5. Shortness of breath, difficulty breathing     Call your surgeon if you experience:   1.  Fever over 101.0. 2.  Inability to urinate. 3.  Nausea and/or vomiting. 4.  Extreme swelling or bruising at the surgical site. 5.  Continued bleeding from the incision. 6.  Increased pain, redness or drainage from the incision. 7.  Problems related to your pain medication. 8.  Any problems and/or concerns    Post Anesthesia Home Care Instructions  Activity: Get plenty of rest for the remainder of the day. A responsible individual must stay with you for 24 hours following the procedure.  For the next 24 hours, DO NOT: -Drive a car -Paediatric nurse -Drink alcoholic beverages -Take any medication unless instructed by your physician -Make any legal decisions or sign important papers.  Meals: Start with liquid foods such as gelatin or soup. Progress to regular foods as tolerated. Avoid greasy, spicy, heavy foods. If nausea and/or vomiting occur, drink only clear liquids until the nausea and/or vomiting subsides. Call your physician if vomiting continues.  Special Instructions/Symptoms: Your throat may feel dry or sore from the anesthesia or the breathing tube  placed in your throat during surgery. If this causes discomfort, gargle with warm salt water. The discomfort should disappear within 24 hours.  If you had a scopolamine patch placed behind your ear for the management of post- operative nausea and/or vomiting:  1. The medication in the patch is effective for 72 hours, after which it should be removed.  Wrap patch in a tissue and discard in the trash.  Wash hands thoroughly with soap and water. 2. You may remove the patch earlier than 72 hours if you experience unpleasant side effects which may include dry mouth, dizziness or visual disturbances. 3. Avoid touching the patch. Wash your hands with soap and water after contact with the patch.

## 2017-05-31 NOTE — Interval H&P Note (Signed)
History and Physical Interval Note:  05/31/2017 12:21 PM  [place]  has presented today for surgery, with the diagnosis of BREAST CANCER  The various methods of treatment have been discussed with the patient and family. After consideration of risks, benefits and other options for treatment, the patient has consented to  Procedure(s): INSERTION PORT-A-CATH WITH [place] (N/A) as a surgical intervention .  The patient's history has been reviewed, patient examined, no change in status, stable for surgery.  I have reviewed the patient's chart and labs.  Questions were answered to the patient's satisfaction.     [name]

## 2017-05-31 NOTE — Anesthesia Preprocedure Evaluation (Signed)
Anesthesia Evaluation  Patient identified by MRN, date of birth, ID band Patient awake    Reviewed: Allergy & Precautions, NPO status , Patient's Chart, lab work & pertinent test results  Airway Mallampati: II  TM Distance: >3 FB Neck ROM: Full    Dental no notable dental hx. (+) Teeth Intact   Pulmonary former smoker,    Pulmonary exam normal breath sounds clear to auscultation       Cardiovascular negative cardio ROS Normal cardiovascular exam Rhythm:Regular Rate:Normal     Neuro/Psych  Headaches, PSYCHIATRIC DISORDERS Anxiety Depression  Neuromuscular disease CVA, No Residual Symptoms    GI/Hepatic negative GI ROS, Neg liver ROS,   Endo/Other  Breast Ca  Renal/GU Renal InsufficiencyRenal disease  negative genitourinary   Musculoskeletal  (+) Fibromyalgia -  Abdominal   Peds  Hematology negative hematology ROS (+)   Anesthesia Other Findings   Reproductive/Obstetrics                             Anesthesia Physical Anesthesia Plan  ASA: II  Anesthesia Plan: General   Post-op Pain Management:    Induction: Intravenous  PONV Risk Score and Plan: 4 or greater and Ondansetron, Scopolamine patch - Pre-op, Midazolam, Dexamethasone and Treatment may vary due to age or medical condition  Airway Management Planned: LMA  Additional Equipment:   Intra-op Plan:   Post-operative Plan: Extubation in OR  Informed Consent: I have reviewed the patients History and Physical, chart, labs and discussed the procedure including the risks, benefits and alternatives for the proposed anesthesia with the patient or authorized representative who has indicated his/her understanding and acceptance.   Dental advisory given  Plan Discussed with: CRNA, Anesthesiologist and Surgeon  Anesthesia Plan Comments:         Anesthesia Quick Evaluation

## 2017-05-31 NOTE — Anesthesia Procedure Notes (Signed)
Procedure Name: LMA Insertion Performed by: Jennifier Smitherman W, CRNA Pre-anesthesia Checklist: Patient identified, Emergency Drugs available, Suction available and Patient being monitored Patient Re-evaluated:Patient Re-evaluated prior to induction Oxygen Delivery Method: Circle system utilized Preoxygenation: Pre-oxygenation with 100% oxygen Induction Type: IV induction Ventilation: Mask ventilation without difficulty LMA: LMA inserted LMA Size: 4.0 Number of attempts: 1 Placement Confirmation: positive ETCO2 Tube secured with: Tape Dental Injury: Teeth and Oropharynx as per pre-operative assessment        

## 2017-05-31 NOTE — Transfer of Care (Signed)
Immediate Anesthesia Transfer of Care Note  Patient: Amy Dawson  Procedure(s) Performed: INSERTION PORT-A-CATH WITH [place] (Right Chest)  Patient Location: PACU  Anesthesia Type:General  Level of Consciousness: awake and sedated  Airway & Oxygen Therapy: Patient Spontanous Breathing and Patient connected to face mask oxygen  Post-op Assessment: Report given to RN and Post -op Vital signs reviewed and stable  Post vital signs: Reviewed and stable  Last Vitals:  Vitals:   05/31/17 0955 05/31/17 1322  BP: 132/83 122/78  Pulse: 73 76  Resp: 18   Temp: (!) 36.4 C   SpO2: 99% 100%    Last Pain:  Vitals:   05/31/17 1021  TempSrc:   PainSc: 3          Complications: No apparent anesthesia complications

## 2017-05-31 NOTE — H&P (Signed)
51 yof referred by Dr Lisbeth Renshaw for new left breast cancer. she noted a left breast mass since october. not really changed. has fh in mgm and aunt. she has c density breasts. has a 2.9x2.5x2.2 cm mass with a negative axilla. core biopsy is grade II-III IDC that is triple negative and Ki is 80%. 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 she is here with her husband to discuss options.   Past Surgical History Tawni Pummel, RN; 05/22/2017 7:33 AM) Breast Biopsy  Left. Cesarean Section - 1   Diagnostic Studies History Tawni Pummel, RN; 05/22/2017 7:33 AM) Colonoscopy  1-5 years ago Mammogram  within last year Pap Smear  1-5 years ago  Medication History Tawni Pummel, RN; 05/22/2017 7:33 AM) Medications Reconciled  Social History Tawni Pummel, RN; 05/22/2017 7:33 AM) Alcohol use  Occasional alcohol use. Caffeine use  Coffee. Illicit drug use  Uses daily. Tobacco use  Former smoker.  Family History Tawni Pummel, RN; 05/22/2017 7:33 AM) Alcohol Abuse  Family Members In General. Arthritis  Family Members In General. Bleeding disorder  Family Members In General. Breast Cancer  Family Members In General. Cerebrovascular Accident  Family Members In General. Colon Polyps  Family Members In General. Depression  Family Members In General. Diabetes Mellitus  Family Members In General. Hypertension  Family Members In General. Melanoma  Family Members In General. Migraine Headache  Family Members In General.  Pregnancy / Birth History Tawni Pummel, RN; 05/22/2017 7:33 AM) Age at menarche  71 years. Contraceptive History  Oral contraceptives. Gravida  3 Maternal age  52-35 Para  2 Regular periods   Other Problems Tawni Pummel, RN; 05/22/2017 7:33 AM) Anxiety Disorder  Bladder Problems  Cerebrovascular Accident  Depression  Diverticulosis  Heart murmur  Hypercholesterolemia  Lump In Breast  Melanoma   Migraine Headache     Review of Systems Sunday Spillers Ledford RN; 05/22/2017 7:33 AM) General Present- Night Sweats. Not Present- Appetite Loss, Chills, Fatigue, Fever, Weight Gain and Weight Loss. Skin Not Present- Change in Wart/Mole, Dryness, Hives, Jaundice, New Lesions, Non-Healing Wounds, Rash and Ulcer. HEENT Present- Seasonal Allergies and Wears glasses/contact lenses. Not Present- Earache, Hearing Loss, Hoarseness, Nose Bleed, Oral Ulcers, Ringing in the Ears, Sinus Pain, Sore Throat, Visual Disturbances and Yellow Eyes. Respiratory Present- Snoring. Not Present- Bloody sputum, Chronic Cough, Difficulty Breathing and Wheezing. Breast Present- Breast Mass and Breast Pain. Not Present- Nipple Discharge and Skin Changes. Gastrointestinal Present- Hemorrhoids. Not Present- Abdominal Pain, Bloating, Bloody Stool, Change in Bowel Habits, Chronic diarrhea, Constipation, Difficulty Swallowing, Excessive gas, Gets full quickly at meals, Indigestion, Nausea, Rectal Pain and Vomiting. Female Genitourinary Present- Urgency. Not Present- Frequency, Nocturia, Painful Urination and Pelvic Pain. Musculoskeletal Not Present- Back Pain, Joint Pain, Joint Stiffness, Muscle Pain, Muscle Weakness and Swelling of Extremities. Neurological Not Present- Decreased Memory, Fainting, Headaches, Numbness, Seizures, Tingling, Tremor, Trouble walking and Weakness. Psychiatric Present- Anxiety and Depression. Not Present- Bipolar, Change in Sleep Pattern, Fearful and Frequent crying. Endocrine Present- Heat Intolerance. Not Present- Cold Intolerance, Excessive Hunger, Hair Changes, Hot flashes and New Diabetes.   Physical Exam [name] MD; 05/22/2017 9:45 PM) General Mental Status-Alert.  Head and Neck Trachea-midline.  Eye Sclera/Conjunctiva - Bilateral-No scleral icterus.  Chest and Lung Exam Chest and lung exam reveals -quiet, even and easy respiratory effort with no use of accessory muscles  and on auscultation, normal breath sounds, no adventitious sounds and normal vocal resonance.  Breast Nipples-No Discharge.   Cardiovascular Cardiovascular examination  reveals -normal heart sounds, regular rate and rhythm with no murmurs.  Abdomen Note: soft nt   Neuropsychiatric Examination of related systems reveals-The patient is well-nourished and well-groomed. Mental status exam performed with findings of-Oriented X3 with appropriate mood and affect.  Lymphatic Head & Neck  General Head & Neck Lymphatics: Bilateral - Description - Normal. Axillary  General Axillary Region: Bilateral - Description - Normal. Note: no Alma adenopathy   Assessment & Plan [name] MD; 05/22/2017 9:50 PM) CARCINOMA OF UPPER-OUTER QUADRANT OF LEFT FEMALE BREAST (C50.412) Story: MRI, port placement for primary chemotherapy we discussed pathophysiology of breast cancer and all available treatments. we discussed sentinel node biopsy at time of surgery. discussed lumpectomy vs mastectomy. I dont think right now she is candidate for lumpectomy and she would be interested. we discussed port placement in the right IJ and chemotherapy followed by mr at completion and then surgery after that

## 2017-05-31 NOTE — Brief Op Note (Signed)
05/31/2017  1:13 PM  PATIENT:  Amy Dawson  51 y.o. female  PRE-OPERATIVE DIAGNOSIS:  BREAST CANCER  POST-OPERATIVE DIAGNOSIS:  BREAST CANCER  PROCEDURE:  Procedure(s): INSERTION PORT-A-CATH WITH [place] (Right)  SURGEON:  Surgeon(s) and Role:    * [name], MD - Primary  PHYSICIAN ASSISTANT:   ASSISTANTS: none   ANESTHESIA:   general  EBL:  2 mL   BLOOD ADMINISTERED:none  DRAINS: none   LOCAL MEDICATIONS USED:  MARCAINE     SPECIMEN:  No Specimen  DISPOSITION OF SPECIMEN:  N/A  COUNTS:  YES  TOURNIQUET:  * No tourniquets in log *  DICTATION: .Dragon Dictation  PLAN OF CARE: Discharge to home after PACU  PATIENT DISPOSITION:  PACU - hemodynamically stable.   Delay start of Pharmacological VTE agent (>24hrs) due to surgical blood loss or risk of bleeding: not applicable

## 2017-05-31 NOTE — Op Note (Signed)
Preoperative diagnosis: breast cancer need for venous access Postoperative diagnosis: same as above Procedure: right ij US guided powerport insertion Surgeon: Dr Serita Grammes EBL: minimal Anes: general  Specimens none Complications none Drains none Sponge count correct Dispo to pacu stable  Indications: This is a 71 yof who is going to get primary chemotherapy.  We discussed port placement.   Procedure: After informed consent was obtained the patient was taken to the operating room. She was given antibiotics. Sequential compression devices were on her legs. She was then placed under general anesthesia with an LMA. Then she was prepped and draped in the standard sterile surgical fashion. Surgical timeout was then performed.  I used the ultrasound to identify the right internal jugular vein. I then accessed the vein using the ultrasound.This aspirated blood. I then placed the wire. This was confirmed by fluoroscopy and ultrasound to be in the correct position. I created a pocket on the right chest. I tunneled the line between the 2 sites. I then dilated the tract and placed the dilator assembly with the sheath. This was done under fluoroscopy. I then removed the sheath and dilator. The wire was also removed. The line was then pulled back to be in the venacava. I hooked this up to the port. I sutured this into place with 2-0 Prolene in 2 places. This aspirated blood and flushed easily.This was confirmed with a final fluoroscopy. I then closed this with 2-0 Vicryl and 4-0 Monocryl. Dermabond was placed on both the incisions.A dressing was placed. She tolerated this well and was transferred to the recovery room in stable condition

## 2017-06-01 ENCOUNTER — Other Ambulatory Visit: Payer: Self-pay | Admitting: Oncology

## 2017-06-01 LAB — URINE CULTURE

## 2017-06-01 MED ORDER — NITROFURANTOIN MONOHYD MACRO 100 MG PO CAPS: 100.0000 mg | ORAL_CAPSULE | Freq: Two times a day (BID) | ORAL | 0 refills | Status: DC

## 2017-06-01 NOTE — Progress Notes (Signed)
I called the patient and left her voicemail stating that she does have a E. coli bladder infection and that I am calling in antibiotic.  She will call if symptoms do not improve within the next 48 hours.

## 2017-06-03 ENCOUNTER — Telehealth: Payer: Self-pay | Admitting: Oncology

## 2017-06-03 ENCOUNTER — Telehealth: Payer: Self-pay

## 2017-06-03 ENCOUNTER — Encounter (HOSPITAL_BASED_OUTPATIENT_CLINIC_OR_DEPARTMENT_OTHER): Payer: Self-pay | Admitting: General Surgery

## 2017-06-03 NOTE — Telephone Encounter (Signed)
06/03/2017 @ 3:43 pm left message @ [phone] informing patient that FMLA forms were ready but needed to know where to fax or send them.  Waiting for a call back.

## 2017-06-03 NOTE — Progress Notes (Signed)
[place]  Telephone:([phone]) [phone] Fax:([phone]) [phone]     ID: Amy Dawson DOB: [birth year]  MR#: [id number]  OLM#:[id number]  Patient Care Team: Amy Freeze, MD as PCP - General (Family Medicine) Dawson, Amy Dad, MD as Consulting Physician (Oncology) Amy Bookbinder, MD as Consulting Physician (General Surgery) Amy Rudd, MD as Consulting Physician (Radiation Oncology) Amy Gully, NP as Nurse Practitioner (Obstetrics and Gynecology) Amy Dock, NP OTHER MD:  CHIEF COMPLAINT: Triple negative breast cancer  CURRENT TREATMENT: Neoadjuvant chemotherapy   HISTORY OF CURRENT ILLNESS: Amy Dawson felt some discomfort in the left breast in June 2018, but did not think much of it.  However in October she palpated a change in the upper outer quadrant of her left breast and she brought this to medical attention.  On May 09, 2017 she had bilateral diagnostic mammography with tomography at the breast center, showing the breast density to be category capital C.  In the left breast that was not obscured mass in the upper outer quadrant, which by palpation was identified at the 1230 o'clock radiant 5 cm from the nipple.  By ultrasound this measured 2.9 cm, and it was irregular and hypoechoic.  The left axilla was sonographically benign.  Biopsy of the left breast mass in question May 14, 2017 showed [phone]) and invasive ductal carcinoma, grade 2 or more likely 3, estrogen and progesterone receptor negative, with no HER-2 amplification, the signals ratio being 1.18 and the number per cell 1.65.  The key 67 was 80%.  The patient's subsequent history is as detailed below.  INTERVAL HISTORY:  Amy Dawson is here today prior to receiving her first cycle of neoadjuvant chemotherapy.  She is starting on Doxorubicin/Cyclophosphamide given on day 1 of a 14 day cycle with Neulasta support.  She is doing well today.     REVIEW OF SYSTEMS: Amy Dawson denies any issues today.   She underwent port placement without difficulty.  She is unsure how to take her anti emetics.  She is unclear about the Neulasta injection as well.  Otherwise, a detailed ROS is non contributory.     PAST MEDICAL HISTORY: Past Medical History:  Diagnosis Date  . Allergy   . Anxiety   . Chronic kidney disease    bladder issues  . Depression   . Fibromyalgia   . Headache    smokes marijuana for migraine pain  . Murmur, cardiac    as a child  . Stroke ([place]) 2000   numbness on left side  . TMJ (dislocation of temporomandibular joint)     PAST SURGICAL HISTORY: Past Surgical History:  Procedure Laterality Date  . CESAREAN SECTION     2  . ENDOMETRIAL ABLATION  2011  . PORTACATH PLACEMENT Right 05/31/2017   Procedure: INSERTION PORT-A-CATH WITH [place];  Surgeon: Amy Bookbinder, MD;  Location: [facility];  Service: General;  Laterality: Right;    FAMILY HISTORY Family History  Problem Relation Age of Onset  . Liver cancer Sister   . Breast cancer Maternal Aunt   . Breast cancer Maternal Grandmother   . Colon cancer Neg Hx   . Esophageal cancer Neg Hx   . Rectal cancer Neg Hx   . Stomach cancer Neg Hx   The patient's parents are in their mid 71s as of November 2018.  The patient had 1 brother, 1 sister.  The sister died from a rare liver cancer at age 54.  A maternal aunt and a paternal grandmother were diagnosed  with breast cancer in their 56s.  GYNECOLOGIC HISTORY:  No LMP recorded. Patient has had an ablation. Menarche age 41.  She underwent endometrial ablation 2011.  She is no longer having periods.  She never used hormone replacement.  First live birth was age 71.  She is GX P2.   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  Her husband Amy Dawson works as an Firefighter for [employer]  Daughter Amy Dawson attends [employer]  [name]    ADVANCED DIRECTIVES: Not in place   HEALTH MAINTENANCE: Social History   Tobacco Use  . Smoking status: Former Research scientist (life sciences)  . Smokeless tobacco: Never Used  Substance Use Topics  . Alcohol use: Yes    Alcohol/week: 0.0 oz    Comment: sometimes  . Drug use: Yes    Types: Marijuana    Comment: last smoked 05-29-17     Colonoscopy: 2016/[facility]  PAP: October 2018  Bone density: Never   Allergies  Allergen Reactions  . Other Other (See Comments)    Red wine & white cheese: migraine Raw egg whites- hives    Current Outpatient Medications  Medication Sig Dispense Refill  . amitriptyline (ELAVIL) 50 MG tablet Take 25 mg by mouth at bedtime as needed for sleep.    . Calcium Glycerophosphate (PRELIEF PO) Take 3 tablets by mouth.    . Cetirizine HCl (ZYRTEC ALLERGY PO) Take by mouth daily.    Marland Kitchen dexamethasone (DECADRON) 4 MG tablet Take 2 tablets by mouth once a day on the day after chemotherapy and then take 2 tablets two times a day for 2 days. Take with food. 30 tablet 1  . dimenhyDRINATE (DRAMAMINE) 50 MG tablet Take 50 mg every 8 (eight) hours as needed by mouth.    . Homeopathic Products (AZO YEAST PLUS PO) Take by mouth.    . lidocaine-prilocaine (EMLA) cream Apply to affected area once 30 g 3  . LORazepam (ATIVAN) 0.5 MG tablet Take 1 tablet (0.5 mg total) at bedtime as needed by mouth (Nausea or vomiting). 30 tablet 0  . nitrofurantoin, macrocrystal-monohydrate, (MACROBID) 100 MG capsule Take 1 capsule (100 mg total) 2 (two) times daily by mouth. 14 capsule 0  . Phenazopyridine HCl (AZO-STANDARD PO) Take by mouth.    . prochlorperazine (COMPAZINE) 10 MG tablet Take 1 tablet (10 mg total) every 6 (six) hours as needed by mouth (Nausea or vomiting). 30 tablet 1  . Pseudoephedrine HCl (SUDAFED PO) Take by mouth as needed.    . Pumpkin Seed-Soy Germ (AZO BLADDER CONTROL/GO-LESS) CAPS Take by mouth.    . traMADol (ULTRAM) 50 MG tablet Take 2 tablets (100 mg total) every 6  (six) hours as needed by mouth. 10 tablet 0   No current facility-administered medications for this visit.     OBJECTIVE:   Vitals:   06/04/17 1029 06/04/17 1031  BP: 125/86 124/82  Pulse: 69 72  Resp: 20   Temp: 97.7 F (36.5 C)   SpO2: 100%      Body mass index is 26.4 kg/m.   Wt Readings from Last 3 Encounters:  06/04/17 177 lb 8 oz (80.5 kg)  05/31/17 176 lb (79.8 kg)  05/22/17 176 lb 14.4 oz (80.2 kg)      ECOG FS:1 - Symptomatic but completely ambulatory GENERAL: Patient is a well appearing female in no acute distress HEENT:  Sclerae anicteric.  Oropharynx  clear and moist. No ulcerations or evidence of oropharyngeal candidiasis. Neck is supple.  NODES:  No cervical, supraclavicular, or axillary lymphadenopathy palpated.  BREAST EXAM:  Deferred. LUNGS:  Clear to auscultation bilaterally.  No wheezes or rhonchi. HEART:  Regular rate and rhythm. No murmur appreciated. ABDOMEN:  Soft, nontender.  Positive, normoactive bowel sounds. No organomegaly palpated. MSK:  No focal spinal tenderness to palpation. Full range of motion bilaterally in the upper extremities. EXTREMITIES:  No peripheral edema.   SKIN:  Clear with no obvious rashes or skin changes. No nail dyscrasia. NEURO:  Nonfocal. Well oriented.  Appropriate affect.     LAB RESULTS:  CMP     Component Value Date/Time   NA 139 06/04/2017 1117   K 3.8 06/04/2017 1117   CL 99 05/03/2014 0038   CO2 27 06/04/2017 1117   GLUCOSE 101 06/04/2017 1117   BUN 6.8 (L) 06/04/2017 1117   CREATININE 0.7 06/04/2017 1117   CALCIUM 8.8 06/04/2017 1117   PROT 7.1 06/04/2017 1117   ALBUMIN 3.8 06/04/2017 1117   AST 13 06/04/2017 1117   ALT 10 06/04/2017 1117   ALKPHOS 60 06/04/2017 1117   BILITOT 0.32 06/04/2017 1117   GFRNONAA >90 05/03/2014 0038   GFRAA >90 05/03/2014 0038    No results found for: TOTALPROTELP, ALBUMINELP, A1GS, A2GS, BETS, BETA2SER, GAMS, MSPIKE, SPEI  No results found for: Nils Pyle, [facility]  Lab Results  Component Value Date   WBC 5.6 06/04/2017   NEUTROABS 3.6 06/04/2017   HGB 13.0 06/04/2017   HCT 38.6 06/04/2017   MCV 91.2 06/04/2017   PLT 255 06/04/2017      Chemistry      Component Value Date/Time   NA 139 06/04/2017 1117   K 3.8 06/04/2017 1117   CL 99 05/03/2014 0038   CO2 27 06/04/2017 1117   BUN 6.8 (L) 06/04/2017 1117   CREATININE 0.7 06/04/2017 1117      Component Value Date/Time   CALCIUM 8.8 06/04/2017 1117   ALKPHOS 60 06/04/2017 1117   AST 13 06/04/2017 1117   ALT 10 06/04/2017 1117   BILITOT 0.32 06/04/2017 1117       No results found for: LABCA2  No components found for: [id number]  No results for input(s): INR in the last 168 hours.  No results found for: LABCA2  No results found for: WUJ811  No results found for: BJY782  No results found for: NFA213  No results found for: CA2729  No components found for: HGQUANT  No results found for: CEA1 / No results found for: CEA1   No results found for: AFPTUMOR  No results found for: [place]  No results found for: PSA1  Appointment on 06/04/2017  Component Date Value Ref Range Status  . Research Labs 06/04/2017 See scanned lab reports in Epic/Media.   Final  . WBC 06/04/2017 5.6  3.9 - 10.3 10e3/uL Final  . NEUT# 06/04/2017 3.6  1.5 - 6.5 10e3/uL Final  . HGB 06/04/2017 13.0  11.6 - 15.9 g/dL Final  . HCT 06/04/2017 38.6  34.8 - 46.6 % Final  . Platelets 06/04/2017 255  145 - 400 10e3/uL Final  . MCV 06/04/2017 91.2  79.5 - 101.0 fL Final  . MCH 06/04/2017 30.6  25.1 - 34.0 pg Final  . MCHC 06/04/2017 33.5  31.5 - 36.0 g/dL Final  . RBC 06/04/2017 4.24  3.70 - 5.45 10e6/uL Final  . RDW 06/04/2017 13.1  11.2 - 14.5 % Final  .  lymph# 06/04/2017 1.5  0.9 - 3.3 10e3/uL Final  . MONO# 06/04/2017 0.4  0.1 - 0.9 10e3/uL Final  . Eosinophils Absolute 06/04/2017 0.1  0.0 - 0.5 10e3/uL Final  . Basophils Absolute 06/04/2017 0.0  0.0 - 0.1 10e3/uL Final  .  NEUT% 06/04/2017 63.3  38.4 - 76.8 % Final  . LYMPH% 06/04/2017 27.6  14.0 - 49.7 % Final  . MONO% 06/04/2017 7.3  0.0 - 14.0 % Final  . EOS% 06/04/2017 1.1  0.0 - 7.0 % Final  . BASO% 06/04/2017 0.7  0.0 - 2.0 % Final  . Sodium 06/04/2017 139  136 - 145 mEq/L Final  . Potassium 06/04/2017 3.8  3.5 - 5.1 mEq/L Final  . Chloride 06/04/2017 105  98 - 109 mEq/L Final  . CO2 06/04/2017 27  22 - 29 mEq/L Final  . Glucose 06/04/2017 101  70 - 140 mg/dl Final   Glucose reference range is for nonfasting patients. Fasting glucose reference range is 70- 100.  Marland Kitchen BUN 06/04/2017 6.8* 7.0 - 26.0 mg/dL Final  . Creatinine 06/04/2017 0.7  0.6 - 1.1 mg/dL Final  . Total Bilirubin 06/04/2017 0.32  0.20 - 1.20 mg/dL Final  . Alkaline Phosphatase 06/04/2017 60  40 - 150 U/L Final  . AST 06/04/2017 13  5 - 34 U/L Final  . ALT 06/04/2017 10  0 - 55 U/L Final  . Total Protein 06/04/2017 7.1  6.4 - 8.3 g/dL Final  . Albumin 06/04/2017 3.8  3.5 - 5.0 g/dL Final  . Calcium 06/04/2017 8.8  8.4 - 10.4 mg/dL Final  . Anion Gap 06/04/2017 7  3 - 11 mEq/L Final  . EGFR 06/04/2017 >60  >60 ml/min/1.73 m2 Final   eGFR is calculated using the CKD-EPI Creatinine Equation (2009)    (this displays the last labs from the last 3 days)  No results found for: TOTALPROTELP, ALBUMINELP, A1GS, A2GS, BETS, BETA2SER, GAMS, MSPIKE, SPEI (this displays SPEP labs)  No results found for: KPAFRELGTCHN, LAMBDASER, KAPLAMBRATIO (kappa/lambda light chains)  No results found for: HGBA, HGBA2QUANT, HGBFQUANT, HGBSQUAN (Hemoglobinopathy evaluation)   No results found for: LDH  No results found for: IRON, TIBC, IRONPCTSAT (Iron and TIBC)  No results found for: FERRITIN  Urinalysis    Component Value Date/Time   COLORURINE YELLOW 05/03/2014 0000   APPEARANCEUR CLEAR 05/03/2014 0000   LABSPEC 1.010 05/30/2017 1315   PHURINE Color Interference 05/30/2017 1315   PHURINE 6.0 05/03/2014 0000   GLUCOSEU Negative 05/30/2017 1315    HGBUR Trace 05/30/2017 1315   HGBUR TRACE (A) 05/03/2014 0000   BILIRUBINUR Color Interference 05/30/2017 1315   KETONESUR Color Interference 05/30/2017 1315   KETONESUR NEGATIVE 05/03/2014 0000   PROTEINUR Color Interference 05/30/2017 1315   PROTEINUR NEGATIVE 05/03/2014 0000   UROBILINOGEN Color Interference 05/30/2017 1315   NITRITE Color Interference 05/30/2017 1315   NITRITE NEGATIVE 05/03/2014 0000   LEUKOCYTESUR Color Interference 05/30/2017 1315     STUDIES: Mr Breast Bilateral W Wo Contrast  Result Date: 05/29/2017 CLINICAL DATA:  Known left breast cancer LABS:  None EXAM: BILATERAL BREAST MRI WITH AND WITHOUT CONTRAST TECHNIQUE: Multiplanar, multisequence MR images of both breasts were obtained prior to and following the intravenous administration of 16 ml of MultiHance. THREE-DIMENSIONAL MR IMAGE RENDERING ON INDEPENDENT WORKSTATION: Three-dimensional MR images were rendered by post-processing of the original MR data on an independent workstation. The three-dimensional MR images were interpreted, and findings are reported in the following complete MRI report for this study. Three dimensional  images were evaluated at the independent DynaCad workstation COMPARISON:  Previous exam(s). FINDINGS: Breast composition: c. Heterogeneous fibroglandular tissue. Background parenchymal enhancement: Moderate. Right breast: No suspicious masses or non mass enhancement in the right breast. There is a lateral cyst on the right. Left breast: The patient's known malignancy is identified centered at 12 o'clock in the left breast. The malignancy measures 3.6 by 2.8 by 3.2 cm. No other malignancy on the left. Lymph nodes: No abnormal lymph nodes are identified in the right axilla or the internal mammary lymph node chains. There is a lymph node in the high left axilla on T1 image 24 with retention of the fatty hilum and a cortex measuring 6 mm in thickness. Ancillary findings:  None. IMPRESSION: 1. Known  malignancy centered at 12 o'clock in the left breast measuring 3.6 x 2.8 x 3.2 cm. 2. There is a prominent node in the high left axilla with a cortex measuring 6 mm. The fatty hilum is retained however. RECOMMENDATION: Recommend second-look ultrasound in the left axilla. If the lymph node with a thickened cortex measuring up to 6 mm is identified, recommend biopsy. Otherwise, recommend sentinel lymph node biopsy at the time of surgery. BI-RADS CATEGORY  4: Suspicious. Electronically Signed   By: Dorise Bullion III M.D   On: 05/29/2017 12:25   Dg Chest Port 1 View  Result Date: 05/31/2017 CLINICAL DATA:  Porta catheter insertion on the right EXAM: PORTABLE CHEST 1 VIEW COMPARISON:  08/19/2009 FINDINGS: Porta catheter on the right with tip at the SVC. Negative for pneumothorax. Negative mediastinal contours accounting for mild aortic tortuosity. Mildly low lung volumes with interstitial crowding. No edema or consolidation. IMPRESSION: Porta catheter with tip at the SVC.  Negative for pneumothorax. Electronically Signed   By: Monte Fantasia M.D.   On: 05/31/2017 13:44   Dg Fluoro Guide Cv Line-no Report  Result Date: 05/31/2017 Fluoroscopy was utilized by the requesting physician.  No radiographic interpretation.   US Breast Ltd Uni Left Inc Axilla  Result Date: 05/09/2017 CLINICAL DATA:  51 year old female with palpable upper outer left breast lump identified on self-examination and for annual bilateral mammograms. EXAM: 2D DIGITAL DIAGNOSTIC BILATERAL MAMMOGRAM WITH CAD AND ADJUNCT TOMO ULTRASOUND LEFT BREAST COMPARISON:  Previous exam(s). ACR Breast Density Category c: The breast tissue is heterogeneously dense, which may obscure small masses. FINDINGS: 2D and 3D full field views of both breasts and spot compression view of the left breast demonstrates an obscured mass within the upper-outer left breast. No findings suspicious for malignancy are identified on the right. Mammographic images were  processed with CAD. On physical exam, a firm palpable mass is identified at the 12:30 position of the left breast 5 cm from the nipple Targeted ultrasound is performed, showing a 2.5 x 2.2 x 2.9 cm irregular hypoechoic mass at the 12:30 position of the left breast 5 cm from the nipple. No abnormal left axillary lymph nodes are identified. IMPRESSION: Suspicious 2.5 x 2.2 x 2.9 cm mass in the upper slightly outer left breast. Tissue sampling recommended. RECOMMENDATION: Ultrasound-guided left breast biopsy, which will be arranged. I have discussed the findings and recommendations with the patient. Results were also provided in writing at the conclusion of the visit. If applicable, a reminder letter will be sent to the patient regarding the next appointment. BI-RADS CATEGORY  4: Suspicious. Electronically Signed   By: Margarette Canada M.D.   On: 05/09/2017 16:57   Mm Diag Breast Tomo Bilateral  Result Date: 05/09/2017 CLINICAL  DATA:  51 year old female with palpable upper outer left breast lump identified on self-examination and for annual bilateral mammograms. EXAM: 2D DIGITAL DIAGNOSTIC BILATERAL MAMMOGRAM WITH CAD AND ADJUNCT TOMO ULTRASOUND LEFT BREAST COMPARISON:  Previous exam(s). ACR Breast Density Category c: The breast tissue is heterogeneously dense, which may obscure small masses. FINDINGS: 2D and 3D full field views of both breasts and spot compression view of the left breast demonstrates an obscured mass within the upper-outer left breast. No findings suspicious for malignancy are identified on the right. Mammographic images were processed with CAD. On physical exam, a firm palpable mass is identified at the 12:30 position of the left breast 5 cm from the nipple Targeted ultrasound is performed, showing a 2.5 x 2.2 x 2.9 cm irregular hypoechoic mass at the 12:30 position of the left breast 5 cm from the nipple. No abnormal left axillary lymph nodes are identified. IMPRESSION: Suspicious 2.5 x 2.2 x 2.9 cm  mass in the upper slightly outer left breast. Tissue sampling recommended. RECOMMENDATION: Ultrasound-guided left breast biopsy, which will be arranged. I have discussed the findings and recommendations with the patient. Results were also provided in writing at the conclusion of the visit. If applicable, a reminder letter will be sent to the patient regarding the next appointment. BI-RADS CATEGORY  4: Suspicious. Electronically Signed   By: Margarette Canada M.D.   On: 05/09/2017 16:57   Mm Clip Placement Left  Result Date: 05/14/2017 CLINICAL DATA:  Confirmation of clip placement after ultrasound-guided core needle biopsy of a highly suspicious mass in the upper outer quadrant of the left breast. EXAM: DIAGNOSTIC LEFT MAMMOGRAM POST ULTRASOUND BIOPSY COMPARISON:  Previous exam(s). FINDINGS: Mammographic images were obtained following ultrasound guided biopsy of a mass in the upper outer quadrant of the left breast. Of note, the mass was so firm that the introducer needle for the biopsy clip would not enter the mass and started to bend, so the clip was deployed just anterior to the mass. The ribbon shaped tissue marker clip is positioned approximately 5-6 mm anterior to the mass, corresponding to the site of deployment. Expected post biopsy changes are present without evidence of hematoma. IMPRESSION: Positioning of the ribbon shaped tissue marker clip approximately 5-6 mm anterior to the mass at the site of clip deployment - the mass was so firm that the introducer needle for the clip would not enter the mass without bending, so the clip was deployed anterior to the mass. Final Assessment: Post Procedure Mammograms for Marker Placement Electronically Signed   By: Evangeline Dakin M.D.   On: 05/14/2017 15:44   [place] Lt Breast Bx W Loc Dev 1st Lesion Img Bx Spec US Guide  Addendum Date: 05/16/2017   ADDENDUM REPORT: 05/16/2017 15:22 ADDENDUM: Pathology revealed GRADE II-III INVASIVE DUCTAL CARCINOMA of the Left  breast, upper outer quadrant, 12:30 o'clock, 5 cm from the nipple. This was found to be concordant by Dr. Peggye Fothergill. Pathology results were discussed with the patient by telephone. The patient reported doing well after the biopsy with tenderness at the site. Post biopsy instructions and care were reviewed and questions were answered. The patient was encouraged to call [facility] for any additional concerns. The patient was referred to [facility] on May 22, 2017. Pathology results reported by Terie Purser, RN on 05/16/2017. Electronically Signed   By: Evangeline Dakin M.D.   On: 05/16/2017 15:22   Result  Date: 05/16/2017 CLINICAL DATA:  51 year old who presented with a palpable mass in the upper left breast, shown on diagnostic workup to have a highly suspicious 2.9 cm mass in the upper outer quadrant at the 12:30 o'clock position approximately 5 cm from the nipple. EXAM: ULTRASOUND GUIDED LEFT BREAST CORE NEEDLE BIOPSY COMPARISON:  Previous exam(s). FINDINGS: I met with the patient and we discussed the procedure of ultrasound-guided biopsy, including benefits and alternatives. We discussed the high likelihood of a successful procedure. We discussed the risks of the procedure, including infection, bleeding, tissue injury, clip migration, and inadequate sampling. Informed written consent was given. The usual time-out protocol was performed immediately prior to the procedure. Lesion quadrant: Upper outer quadrant. Using sterile technique with chlorhexidine as skin antisepsis, 1% Lidocaine as local anesthetic, under direct ultrasound visualization, a 12 gauge Bard Marquee core needle device was used to perform biopsy of the 2.9 cm mass in the upper outer quadrant at the 12:30 o'clock position approximately 5 cm from the nipple using a lateral approach. At the conclusion of the procedure a ribbon tissue  marker clip was deployed into the biopsy cavity. Follow up 2 view mammogram was performed and dictated separately. IMPRESSION: Ultrasound guided biopsy of a 2.9 cm mass in the upper outer quadrant of the left breast. No apparent complications. Electronically Signed: By: Evangeline Dakin M.D. On: 05/14/2017 15:42    ELIGIBLE FOR AVAILABLE RESEARCH PROTOCOL: SWOG [id number] depending on response to neoadjuvant treatment, UPBEAT  ASSESSMENT: 51 y.o. [place] status post left breast upper outer quadrant biopsy May 14, 2017 for a clinical T2 N0, stage IIB invasive ductal carcinoma, grade 3, triple negative, with an MIB-1 of 80%  (1) neoadjuvant chemotherapy will consist of doxorubicin and cyclophosphamide in dose dense fashion x4 starting June 04, 2017, to be followed by weekly carboplatin and paclitaxel x12  (a) echo on 05/30/2017: LVEF 55-60%  (2) breast conserving surgery with sentinel lymph node sampling to follow  (3) adjuvant radiation to follow surgery  (4) genetics testing pending  PLAN: Amy Dawson is doing well today.  She did not have any labs drawn before the appointment, so I added those on and Amy Dawson will draw them.  Additionally, she did not have onpro ordered initially with her chemotherapy.  I will work to see if we can get this approved.  She will likely need to return with [place] to get the Neulasta tomorrow since that was initially ordered.  I have reviewed this need in detail with our chemo authorization specialist, [place].  I reviewed Amy Dawson's anti emetics in detail with her.  I gave her a map and we talked about everything.  She verbalized understanding.    Amy Dawson will return tomorrow for Neulasta, and in 1 week for labs and follow up.     Amy Dawson has a good understanding of the overall plan. She agrees with it. She knows the goal of treatment in her case is cure. She will call with any problems that may develop before her next visit here.  A total of (30) minutes of face-to-face time was  spent with this patient with greater than 50% of that time in counseling and care-coordination.   Amy Dock, NP   06/05/2017 12:43 AM Medical Oncology and Hematology [facility] [address] [place], [place] [address] Tel. [phone]    Fax. [phone]

## 2017-06-03 NOTE — Telephone Encounter (Signed)
Called and left a message to fast a minimum of 3 hrs prior to lab test on 11/20 for research.

## 2017-06-04 ENCOUNTER — Encounter: Payer: Self-pay | Admitting: *Deleted

## 2017-06-04 ENCOUNTER — Encounter: Payer: Self-pay | Admitting: Adult Health

## 2017-06-04 ENCOUNTER — Ambulatory Visit: Payer: 59

## 2017-06-04 ENCOUNTER — Other Ambulatory Visit: Payer: Self-pay | Admitting: *Deleted

## 2017-06-04 ENCOUNTER — Encounter: Payer: Self-pay | Admitting: General Practice

## 2017-06-04 ENCOUNTER — Telehealth: Payer: Self-pay | Admitting: Oncology

## 2017-06-04 ENCOUNTER — Ambulatory Visit (HOSPITAL_BASED_OUTPATIENT_CLINIC_OR_DEPARTMENT_OTHER): Payer: 59

## 2017-06-04 ENCOUNTER — Other Ambulatory Visit (HOSPITAL_BASED_OUTPATIENT_CLINIC_OR_DEPARTMENT_OTHER): Payer: 59

## 2017-06-04 ENCOUNTER — Ambulatory Visit (HOSPITAL_BASED_OUTPATIENT_CLINIC_OR_DEPARTMENT_OTHER): Payer: 59 | Admitting: Adult Health

## 2017-06-04 ENCOUNTER — Encounter: Payer: Self-pay | Admitting: Hematology and Oncology

## 2017-06-04 VITALS — BP 124/82 | HR 72 | Temp 97.7°F | Resp 20 | Ht 68.75 in | Wt 177.5 lb

## 2017-06-04 DIAGNOSIS — C50412 Malignant neoplasm of upper-outer quadrant of left female breast: Secondary | ICD-10-CM

## 2017-06-04 DIAGNOSIS — Z171 Estrogen receptor negative status [ER-]: Principal | ICD-10-CM

## 2017-06-04 DIAGNOSIS — Z95828 Presence of other vascular implants and grafts: Secondary | ICD-10-CM

## 2017-06-04 DIAGNOSIS — Z5111 Encounter for antineoplastic chemotherapy: Secondary | ICD-10-CM | POA: Diagnosis not present

## 2017-06-04 LAB — COMPREHENSIVE METABOLIC PANEL
ALT: 10 U/L (ref 0–55)
AST: 13 U/L (ref 5–34)
Albumin: 3.8 g/dL (ref 3.5–5.0)
Alkaline Phosphatase: 60 U/L (ref 40–150)
Anion Gap: 7 mEq/L (ref 3–11)
BUN: 6.8 mg/dL — AB (ref 7.0–26.0)
CO2: 27 meq/L (ref 22–29)
CREATININE: 0.7 mg/dL (ref 0.6–1.1)
Calcium: 8.8 mg/dL (ref 8.4–10.4)
Chloride: 105 mEq/L (ref 98–109)
EGFR: >60 mL/min/{1.73_m2} (ref >60)
Glucose: 101 mg/dl (ref 70–140)
Potassium: 3.8 mEq/L (ref 3.5–5.1)
Sodium: 139 mEq/L (ref 136–145)
TOTAL PROTEIN: 7.1 g/dL (ref 6.4–8.3)
Total Bilirubin: 0.32 mg/dL (ref 0.20–1.20)

## 2017-06-04 LAB — CBC WITH DIFFERENTIAL/PLATELET
BASO%: 0.7 % (ref 0.0–2.0)
BASOS ABS: 0 10*3/uL (ref 0.0–0.1)
EOS ABS: 0.1 10*3/uL (ref 0.0–0.5)
EOS%: 1.1 % (ref 0.0–7.0)
HEMATOCRIT: 38.6 % (ref 34.8–46.6)
HEMOGLOBIN: 13 g/dL (ref 11.6–15.9)
LYMPH#: 1.5 10*3/uL (ref 0.9–3.3)
LYMPH%: 27.6 % (ref 14.0–49.7)
MCH: 30.6 pg (ref 25.1–34.0)
MCHC: 33.5 g/dL (ref 31.5–36.0)
MCV: 91.2 fL (ref 79.5–101.0)
MONO#: 0.4 10*3/uL (ref 0.1–0.9)
MONO%: 7.3 % (ref 0.0–14.0)
NEUT#: 3.6 10*3/uL (ref 1.5–6.5)
NEUT%: 63.3 % (ref 38.4–76.8)
PLATELETS: 255 10*3/uL (ref 145–400)
RBC: 4.24 10*6/uL (ref 3.70–5.45)
RDW: 13.1 % (ref 11.2–14.5)
WBC: 5.6 10*3/uL (ref 3.9–10.3)

## 2017-06-04 LAB — RESEARCH LABS

## 2017-06-04 MED ORDER — SODIUM CHLORIDE 0.9 % IV SOLN
Freq: Once | INTRAVENOUS | Status: AC
  Administered 2017-06-04: 13:00:00 via INTRAVENOUS
  Filled 2017-06-04: qty 5

## 2017-06-04 MED ORDER — PEGFILGRASTIM 6 MG/0.6ML ~~LOC~~ PSKT
6.0000 mg | PREFILLED_SYRINGE | Freq: Once | SUBCUTANEOUS | Status: DC
Start: 2017-06-04 — End: 2017-06-04
  Filled 2017-06-04: qty 0.6

## 2017-06-04 MED ORDER — SODIUM CHLORIDE 0.9% FLUSH
10.0000 mL | INTRAVENOUS | Status: DC | PRN
  Administered 2017-06-04: 10 mL via INTRAVENOUS
  Filled 2017-06-04: qty 10

## 2017-06-04 MED ORDER — SODIUM CHLORIDE 0.9% FLUSH
10.0000 mL | INTRAVENOUS | Status: DC | PRN
  Administered 2017-06-04: 10 mL
  Filled 2017-06-04: qty 10

## 2017-06-04 MED ORDER — CYCLOPHOSPHAMIDE CHEMO INJECTION 1 GM
600.0000 mg/m2 | Freq: Once | INTRAMUSCULAR | Status: AC
  Administered 2017-06-04: 1180 mg via INTRAVENOUS
  Filled 2017-06-04: qty 59

## 2017-06-04 MED ORDER — PALONOSETRON HCL INJECTION 0.25 MG/5ML
0.2500 mg | Freq: Once | INTRAVENOUS | Status: AC
  Administered 2017-06-04: 0.25 mg via INTRAVENOUS

## 2017-06-04 MED ORDER — DOXORUBICIN HCL CHEMO IV INJECTION 2 MG/ML
60.0000 mg/m2 | Freq: Once | INTRAVENOUS | Status: AC
  Administered 2017-06-04: 118 mg via INTRAVENOUS
  Filled 2017-06-04: qty 59

## 2017-06-04 MED ORDER — SODIUM CHLORIDE 0.9 % IV SOLN
Freq: Once | INTRAVENOUS | Status: AC
  Administered 2017-06-04: 13:00:00 via INTRAVENOUS

## 2017-06-04 MED ORDER — HEPARIN SOD (PORK) LOCK FLUSH 100 UNIT/ML IV SOLN
500.0000 [IU] | Freq: Once | INTRAVENOUS | Status: AC | PRN
  Administered 2017-06-04: 500 [IU]
  Filled 2017-06-04: qty 5

## 2017-06-04 MED ORDER — PALONOSETRON HCL INJECTION 0.25 MG/5ML
INTRAVENOUS | Status: AC
  Filled 2017-06-04: qty 5

## 2017-06-04 NOTE — Progress Notes (Signed)
[facility] Spiritual Care Note  Referred by Clinical Research Nurse Lexine Baton Eldreth/RN and Dr [name], reached out to [facility] again.  LVM encouraging her to return call.  Will aim to see her when she is next in infusion as well (currently scheduled for 12/4).   [place], [place], [facility] Pager [phone] Voicemail [phone]

## 2017-06-04 NOTE — Patient Instructions (Signed)
[facility] Discharge Instructions for Patients Receiving Chemotherapy  Today you received the following chemotherapy agents Adriamycin and Cytoxan  To help prevent nausea and vomiting after your treatment, we encourage you to take your nausea medication as directed   If you develop nausea and vomiting that is not controlled by your nausea medication, call the clinic.   BELOW ARE SYMPTOMS THAT SHOULD BE REPORTED IMMEDIATELY:  *FEVER GREATER THAN 100.5 F  *CHILLS WITH OR WITHOUT FEVER  NAUSEA AND VOMITING THAT IS NOT CONTROLLED WITH YOUR NAUSEA MEDICATION  *UNUSUAL SHORTNESS OF BREATH  *UNUSUAL BRUISING OR BLEEDING  TENDERNESS IN MOUTH AND THROAT WITH OR WITHOUT PRESENCE OF ULCERS  *URINARY PROBLEMS  *BOWEL PROBLEMS  UNUSUAL RASH Items with * indicate a potential emergency and should be followed up as soon as possible.  Feel free to call the clinic should you have any questions or concerns. The clinic phone number is [phone].  Please show the [facility] at check-in to the Emergency Department and triage nurse.    Doxorubicin injection What is this medicine? DOXORUBICIN (dox oh ROO bi sin) is a chemotherapy drug. It is used to treat many kinds of cancer like leukemia, lymphoma, neuroblastoma, sarcoma, and Wilms' tumor. It is also used to treat bladder cancer, breast cancer, lung cancer, ovarian cancer, stomach cancer, and thyroid cancer. This medicine may be used for other purposes; ask your health care provider or pharmacist if you have questions. COMMON BRAND NAME(S): Adriamycin, Adriamycin PFS, Adriamycin RDF, Rubex What should I tell my health care provider before I take this medicine? They need to know if you have any of these conditions: -heart disease -history of low blood counts caused by a medicine -liver disease -recent or ongoing radiation therapy -an unusual or allergic reaction to doxorubicin, other chemotherapy agents, other  medicines, foods, dyes, or preservatives -pregnant or trying to get pregnant -breast-feeding How should I use this medicine? This drug is given as an infusion into a vein. It is administered in a hospital or clinic by a specially trained health care professional. If you have pain, swelling, burning or any unusual feeling around the site of your injection, tell your health care professional right away. Talk to your pediatrician regarding the use of this medicine in children. Special care may be needed. Overdosage: If you think you have taken too much of this medicine contact a poison control center or emergency room at once. NOTE: This medicine is only for you. Do not share this medicine with others. What if I miss a dose? It is important not to miss your dose. Call your doctor or health care professional if you are unable to keep an appointment. What may interact with this medicine? This medicine may interact with the following medications: -6-mercaptopurine -paclitaxel -phenytoin -St. John's Wort -trastuzumab -verapamil This list may not describe all possible interactions. Give your health care provider a list of all the medicines, herbs, non-prescription drugs, or dietary supplements you use. Also tell them if you smoke, drink alcohol, or use illegal drugs. Some items may interact with your medicine. What should I watch for while using this medicine? This drug may make you feel generally unwell. This is not uncommon, as chemotherapy can affect healthy cells as well as cancer cells. Report any side effects. Continue your course of treatment even though you feel ill unless your doctor tells you to stop. There is a maximum amount of this medicine you should receive throughout your life. The amount  depends on the medical condition being treated and your overall health. Your doctor will watch how much of this medicine you receive in your lifetime. Tell your doctor if you have taken this medicine  before. You may need blood work done while you are taking this medicine. Your urine may turn red for a few days after your dose. This is not blood. If your urine is dark or brown, call your doctor. In some cases, you may be given additional medicines to help with side effects. Follow all directions for their use. Call your doctor or health care professional for advice if you get a fever, chills or sore throat, or other symptoms of a cold or flu. Do not treat yourself. This drug decreases your body's ability to fight infections. Try to avoid being around people who are sick. This medicine may increase your risk to bruise or bleed. Call your doctor or health care professional if you notice any unusual bleeding. Talk to your doctor about your risk of cancer. You may be more at risk for certain types of cancers if you take this medicine. Do not become pregnant while taking this medicine or for 6 months after stopping it. Women should inform their doctor if they wish to become pregnant or think they might be pregnant. Men should not father a child while taking this medicine and for 6 months after stopping it. There is a potential for serious side effects to an unborn child. Talk to your health care professional or pharmacist for more information. Do not breast-feed an infant while taking this medicine. This medicine has caused ovarian failure in some women and reduced sperm counts in some men This medicine may interfere with the ability to have a child. Talk with your doctor or health care professional if you are concerned about your fertility. What side effects may I notice from receiving this medicine? Side effects that you should report to your doctor or health care professional as soon as possible: -allergic reactions like skin rash, itching or hives, swelling of the face, lips, or tongue -breathing problems -chest pain -fast or irregular heartbeat -low blood counts - this medicine may decrease the  number of white blood cells, red blood cells and platelets. You may be at increased risk for infections and bleeding. -pain, redness, or irritation at site where injected -signs of infection - fever or chills, cough, sore throat, pain or difficulty passing urine -signs of decreased platelets or bleeding - bruising, pinpoint red spots on the skin, black, tarry stools, blood in the urine -swelling of the ankles, feet, hands -tiredness -weakness Side effects that usually do not require medical attention (report to your doctor or health care professional if they continue or are bothersome): -diarrhea -hair loss -mouth sores -nail discoloration or damage -nausea -red colored urine -vomiting This list may not describe all possible side effects. Call your doctor for medical advice about side effects. You may report side effects to FDA at 1-800-FDA-1088. Where should I keep my medicine? This drug is given in a hospital or clinic and will not be stored at home. NOTE: This sheet is a summary. It may not cover all possible information. If you have questions about this medicine, talk to your doctor, pharmacist, or health care provider.  2018 Elsevier/Gold Standard (2015-08-29 11:28:51)  Cyclophosphamide (Cytoxan) injection What is this medicine? CYCLOPHOSPHAMIDE (sye kloe FOSS fa mide) is a chemotherapy drug. It slows the growth of cancer cells. This medicine is used to treat many types of  cancer like lymphoma, myeloma, leukemia, breast cancer, and ovarian cancer, to name a few. This medicine may be used for other purposes; ask your health care provider or pharmacist if you have questions. COMMON BRAND NAME(S): Cytoxan, Neosar What should I tell my health care provider before I take this medicine? They need to know if you have any of these conditions: -blood disorders -history of other chemotherapy -infection -kidney disease -liver disease -recent or ongoing radiation therapy -tumors in the  bone marrow -an unusual or allergic reaction to cyclophosphamide, other chemotherapy, other medicines, foods, dyes, or preservatives -pregnant or trying to get pregnant -breast-feeding How should I use this medicine? This drug is usually given as an injection into a vein or muscle or by infusion into a vein. It is administered in a hospital or clinic by a specially trained health care professional. Talk to your pediatrician regarding the use of this medicine in children. Special care may be needed. Overdosage: If you think you have taken too much of this medicine contact a poison control center or emergency room at once. NOTE: This medicine is only for you. Do not share this medicine with others. What if I miss a dose? It is important not to miss your dose. Call your doctor or health care professional if you are unable to keep an appointment. What may interact with this medicine? This medicine may interact with the following medications: -amiodarone -amphotericin B -azathioprine -certain antiviral medicines for HIV or AIDS such as protease inhibitors (e.g., indinavir, ritonavir) and zidovudine -certain blood pressure medications such as benazepril, captopril, enalapril, fosinopril, lisinopril, moexipril, monopril, perindopril, quinapril, ramipril, trandolapril -certain cancer medications such as anthracyclines (e.g., daunorubicin, doxorubicin), busulfan, cytarabine, paclitaxel, pentostatin, tamoxifen, trastuzumab -certain diuretics such as chlorothiazide, chlorthalidone, hydrochlorothiazide, indapamide, metolazone -certain medicines that treat or prevent blood clots like warfarin -certain muscle relaxants such as succinylcholine -cyclosporine -etanercept -indomethacin -medicines to increase blood counts like filgrastim, pegfilgrastim, sargramostim -medicines used as general anesthesia -metronidazole -natalizumab This list may not describe all possible interactions. Give your health care  provider a list of all the medicines, herbs, non-prescription drugs, or dietary supplements you use. Also tell them if you smoke, drink alcohol, or use illegal drugs. Some items may interact with your medicine. What should I watch for while using this medicine? Visit your doctor for checks on your progress. This drug may make you feel generally unwell. This is not uncommon, as chemotherapy can affect healthy cells as well as cancer cells. Report any side effects. Continue your course of treatment even though you feel ill unless your doctor tells you to stop. Drink water or other fluids as directed. Urinate often, even at night. In some cases, you may be given additional medicines to help with side effects. Follow all directions for their use. Call your doctor or health care professional for advice if you get a fever, chills or sore throat, or other symptoms of a cold or flu. Do not treat yourself. This drug decreases your body's ability to fight infections. Try to avoid being around people who are sick. This medicine may increase your risk to bruise or bleed. Call your doctor or health care professional if you notice any unusual bleeding. Be careful brushing and flossing your teeth or using a toothpick because you may get an infection or bleed more easily. If you have any dental work done, tell your dentist you are receiving this medicine. You may get drowsy or dizzy. Do not drive, use machinery, or do anything that  needs mental alertness until you know how this medicine affects you. Do not become pregnant while taking this medicine or for 1 year after stopping it. Women should inform their doctor if they wish to become pregnant or think they might be pregnant. Men should not father a child while taking this medicine and for 4 months after stopping it. There is a potential for serious side effects to an unborn child. Talk to your health care professional or pharmacist for more information. Do not  breast-feed an infant while taking this medicine. This medicine may interfere with the ability to have a child. This medicine has caused ovarian failure in some women. This medicine has caused reduced sperm counts in some men. You should talk with your doctor or health care professional if you are concerned about your fertility. If you are going to have surgery, tell your doctor or health care professional that you have taken this medicine. What side effects may I notice from receiving this medicine? Side effects that you should report to your doctor or health care professional as soon as possible: -allergic reactions like skin rash, itching or hives, swelling of the face, lips, or tongue -low blood counts - this medicine may decrease the number of white blood cells, red blood cells and platelets. You may be at increased risk for infections and bleeding. -signs of infection - fever or chills, cough, sore throat, pain or difficulty passing urine -signs of decreased platelets or bleeding - bruising, pinpoint red spots on the skin, black, tarry stools, blood in the urine -signs of decreased red blood cells - unusually weak or tired, fainting spells, lightheadedness -breathing problems -dark urine -dizziness -palpitations -swelling of the ankles, feet, hands -trouble passing urine or change in the amount of urine -weight gain -yellowing of the eyes or skin Side effects that usually do not require medical attention (report to your doctor or health care professional if they continue or are bothersome): -changes in nail or skin color -hair loss -missed menstrual periods -mouth sores -nausea, vomiting This list may not describe all possible side effects. Call your doctor for medical advice about side effects. You may report side effects to FDA at 1-800-FDA-1088. Where should I keep my medicine? This drug is given in a hospital or clinic and will not be stored at home. NOTE: This sheet is a  summary. It may not cover all possible information. If you have questions about this medicine, talk to your doctor, pharmacist, or health care provider.  2018 Elsevier/Gold Standard (2012-05-16 16:22:58)  Pegfilgrastim (Neulasta) injection What is this medicine? PEGFILGRASTIM (PEG fil gra stim) is a long-acting granulocyte colony-stimulating factor that stimulates the growth of neutrophils, a type of white blood cell important in the body's fight against infection. It is used to reduce the incidence of fever and infection in patients with certain types of cancer who are receiving chemotherapy that affects the bone marrow, and to increase survival after being exposed to high doses of radiation. This medicine may be used for other purposes; ask your health care provider or pharmacist if you have questions. COMMON BRAND NAME(S): Neulasta What should I tell my health care provider before I take this medicine? They need to know if you have any of these conditions: -kidney disease -latex allergy -ongoing radiation therapy -sickle cell disease -skin reactions to acrylic adhesives (On-Body Injector only) -an unusual or allergic reaction to pegfilgrastim, filgrastim, other medicines, foods, dyes, or preservatives -pregnant or trying to get pregnant -breast-feeding How should I  use this medicine? This medicine is for injection under the skin. If you get this medicine at home, you will be taught how to prepare and give the pre-filled syringe or how to use the On-body Injector. Refer to the patient Instructions for Use for detailed instructions. Use exactly as directed. Tell your healthcare provider immediately if you suspect that the On-body Injector may not have performed as intended or if you suspect the use of the On-body Injector resulted in a missed or partial dose. It is important that you put your used needles and syringes in a special sharps container. Do not put them in a trash can. If you do not  have a sharps container, call your pharmacist or healthcare provider to get one. Talk to your pediatrician regarding the use of this medicine in children. While this drug may be prescribed for selected conditions, precautions do apply. Overdosage: If you think you have taken too much of this medicine contact a poison control center or emergency room at once. NOTE: This medicine is only for you. Do not share this medicine with others. What if I miss a dose? It is important not to miss your dose. Call your doctor or health care professional if you miss your dose. If you miss a dose due to an On-body Injector failure or leakage, a new dose should be administered as soon as possible using a single prefilled syringe for manual use. What may interact with this medicine? Interactions have not been studied. Give your health care provider a list of all the medicines, herbs, non-prescription drugs, or dietary supplements you use. Also tell them if you smoke, drink alcohol, or use illegal drugs. Some items may interact with your medicine. This list may not describe all possible interactions. Give your health care provider a list of all the medicines, herbs, non-prescription drugs, or dietary supplements you use. Also tell them if you smoke, drink alcohol, or use illegal drugs. Some items may interact with your medicine. What should I watch for while using this medicine? You may need blood work done while you are taking this medicine. If you are going to need a MRI, CT scan, or other procedure, tell your doctor that you are using this medicine (On-Body Injector only). What side effects may I notice from receiving this medicine? Side effects that you should report to your doctor or health care professional as soon as possible: -allergic reactions like skin rash, itching or hives, swelling of the face, lips, or tongue -dizziness -fever -pain, redness, or irritation at site where injected -pinpoint red spots on  the skin -red or dark-brown urine -shortness of breath or breathing problems -stomach or side pain, or pain at the shoulder -swelling -tiredness -trouble passing urine or change in the amount of urine Side effects that usually do not require medical attention (report to your doctor or health care professional if they continue or are bothersome): -bone pain -muscle pain This list may not describe all possible side effects. Call your doctor for medical advice about side effects. You may report side effects to FDA at 1-800-FDA-1088. Where should I keep my medicine? Keep out of the reach of children. Store pre-filled syringes in a refrigerator between 2 and 8 degrees C (36 and 46 degrees F). Do not freeze. Keep in carton to protect from light. Throw away this medicine if it is left out of the refrigerator for more than 48 hours. Throw away any unused medicine after the expiration date. NOTE: This sheet is  a summary. It may not cover all possible information. If you have questions about this medicine, talk to your doctor, pharmacist, or health care provider.  2018 Elsevier/Gold Standard (2016-06-28 12:58:03)

## 2017-06-04 NOTE — Progress Notes (Signed)
Epic interface canceled CMP after results were loaded.  Copy received from lab and faxed to infusion suite.  Results sent for scanning into Media and issue reported to [facility] team.

## 2017-06-04 NOTE — Progress Notes (Signed)
CMP from today is okay per [name] for treatment.

## 2017-06-04 NOTE — Telephone Encounter (Signed)
06/04/2017 Patient picked up FMLA/Disability forms this morning during appointment.

## 2017-06-04 NOTE — Patient Instructions (Signed)

## 2017-06-04 NOTE — Telephone Encounter (Signed)
Scheduled appt per 11/20 los. Left message for patient regarding her appointment.

## 2017-06-05 ENCOUNTER — Encounter: Payer: Self-pay | Admitting: Adult Health

## 2017-06-05 ENCOUNTER — Ambulatory Visit (HOSPITAL_BASED_OUTPATIENT_CLINIC_OR_DEPARTMENT_OTHER): Payer: 59

## 2017-06-05 VITALS — BP 129/72 | HR 79 | Temp 98.1°F | Resp 18

## 2017-06-05 DIAGNOSIS — Z171 Estrogen receptor negative status [ER-]: Principal | ICD-10-CM

## 2017-06-05 DIAGNOSIS — C50412 Malignant neoplasm of upper-outer quadrant of left female breast: Secondary | ICD-10-CM | POA: Diagnosis not present

## 2017-06-05 DIAGNOSIS — Z5189 Encounter for other specified aftercare: Secondary | ICD-10-CM | POA: Diagnosis not present

## 2017-06-05 MED ORDER — PEGFILGRASTIM INJECTION 6 MG/0.6ML ~~LOC~~
6.0000 mg | PREFILLED_SYRINGE | Freq: Once | SUBCUTANEOUS | Status: AC
  Administered 2017-06-05: 6 mg via SUBCUTANEOUS
  Filled 2017-06-05: qty 0.6

## 2017-06-10 ENCOUNTER — Other Ambulatory Visit: Payer: Self-pay | Admitting: *Deleted

## 2017-06-10 DIAGNOSIS — Z171 Estrogen receptor negative status [ER-]: Principal | ICD-10-CM

## 2017-06-10 DIAGNOSIS — C50412 Malignant neoplasm of upper-outer quadrant of left female breast: Secondary | ICD-10-CM

## 2017-06-11 ENCOUNTER — Telehealth: Payer: Self-pay

## 2017-06-11 ENCOUNTER — Ambulatory Visit: Payer: 59 | Admitting: Adult Health

## 2017-06-11 ENCOUNTER — Ambulatory Visit (HOSPITAL_BASED_OUTPATIENT_CLINIC_OR_DEPARTMENT_OTHER): Payer: 59 | Admitting: Adult Health

## 2017-06-11 ENCOUNTER — Other Ambulatory Visit (HOSPITAL_BASED_OUTPATIENT_CLINIC_OR_DEPARTMENT_OTHER): Payer: 59

## 2017-06-11 ENCOUNTER — Other Ambulatory Visit: Payer: Self-pay | Admitting: Oncology

## 2017-06-11 ENCOUNTER — Telehealth: Payer: Self-pay | Admitting: Adult Health

## 2017-06-11 ENCOUNTER — Other Ambulatory Visit: Payer: 59

## 2017-06-11 VITALS — BP 122/87 | HR 77 | Temp 97.8°F | Resp 18 | Ht 68.75 in | Wt 176.8 lb

## 2017-06-11 DIAGNOSIS — Z171 Estrogen receptor negative status [ER-]: Principal | ICD-10-CM

## 2017-06-11 DIAGNOSIS — C50412 Malignant neoplasm of upper-outer quadrant of left female breast: Secondary | ICD-10-CM

## 2017-06-11 DIAGNOSIS — D701 Agranulocytosis secondary to cancer chemotherapy: Secondary | ICD-10-CM | POA: Diagnosis not present

## 2017-06-11 LAB — COMPREHENSIVE METABOLIC PANEL
ALBUMIN: 3.4 g/dL — AB (ref 3.5–5.0)
ALK PHOS: 73 U/L (ref 40–150)
ALT: 11 U/L (ref 0–55)
ANION GAP: 8 meq/L (ref 3–11)
AST: 10 U/L (ref 5–34)
BILIRUBIN TOTAL: 0.27 mg/dL (ref 0.20–1.20)
BUN: 13.4 mg/dL (ref 7.0–26.0)
CALCIUM: 8.8 mg/dL (ref 8.4–10.4)
CHLORIDE: 102 meq/L (ref 98–109)
CO2: 26 mEq/L (ref 22–29)
CREATININE: 0.7 mg/dL (ref 0.6–1.1)
EGFR: >60 mL/min/{1.73_m2} (ref >60)
Glucose: 115 mg/dl (ref 70–140)
Potassium: 3.9 mEq/L (ref 3.5–5.1)
Sodium: 136 mEq/L (ref 136–145)
TOTAL PROTEIN: 6.7 g/dL (ref 6.4–8.3)

## 2017-06-11 LAB — CBC WITH DIFFERENTIAL/PLATELET
BASO%: 1.7 % (ref 0.0–2.0)
Basophils Absolute: 0 10*3/uL (ref 0.0–0.1)
EOS ABS: 0.1 10*3/uL (ref 0.0–0.5)
EOS%: 5.2 % (ref 0.0–7.0)
HCT: 40.3 % (ref 34.8–46.6)
HGB: 13.2 g/dL (ref 11.6–15.9)
LYMPH%: 71.3 % — AB (ref 14.0–49.7)
MCH: 30.3 pg (ref 25.1–34.0)
MCHC: 32.8 g/dL (ref 31.5–36.0)
MCV: 92.3 fL (ref 79.5–101.0)
MONO#: 0 10*3/uL — ABNORMAL LOW (ref 0.1–0.9)
MONO%: 4.3 % (ref 0.0–14.0)
NEUT%: 17.5 % — ABNORMAL LOW (ref 38.4–76.8)
NEUTROS ABS: 0.2 10*3/uL — AB (ref 1.5–6.5)
Platelets: 147 10*3/uL (ref 145–400)
RBC: 4.37 10*6/uL (ref 3.70–5.45)
RDW: 12.9 % (ref 11.2–14.5)
WBC: 1 10*3/uL — AB (ref 3.9–10.3)
lymph#: 0.7 10*3/uL — ABNORMAL LOW (ref 0.9–3.3)

## 2017-06-11 NOTE — Telephone Encounter (Signed)
Spoke with lab technician by phone to confirm [place] of 0.2.  Printed copy given to [place], LPN for [facility] to review during pt appt today.

## 2017-06-11 NOTE — Progress Notes (Signed)
[place]  Telephone:([phone]) [phone] Fax:(336) [phone]     ID: Amy Dawson DOB: 04-Oct-1965  MR#: [id number]  QJF#:[id number]  Patient Care Team: [name], MD as PCP - General (Family Medicine) [name], [name], MD as Consulting Physician (Oncology) [name], MD as Consulting Physician (General Surgery) [name], MD as Consulting Physician (Radiation Oncology) [name], NP as Nurse Practitioner (Obstetrics and Gynecology) Scot Dock, NP OTHER MD:  CHIEF COMPLAINT: Triple negative breast cancer  CURRENT TREATMENT: Neoadjuvant chemotherapy   HISTORY OF CURRENT ILLNESS: Amy Dawson felt some discomfort in the left breast in June 2018, but did not think much of it.  However in October she palpated a change in the upper outer quadrant of her left breast and she brought this to medical attention.  On May 09, 2017 she had bilateral diagnostic mammography with tomography at the breast center, showing the breast density to be category capital C.  In the left breast that was not obscured mass in the upper outer quadrant, which by palpation was identified at the 1230 o'clock radiant 5 cm from the nipple.  By ultrasound this measured 2.9 cm, and it was irregular and hypoechoic.  The left axilla was sonographically benign.  Biopsy of the left breast mass in question May 14, 2017 showed [phone]) and invasive ductal carcinoma, grade 2 or more likely 3, estrogen and progesterone receptor negative, with no HER-2 amplification, the signals ratio being 1.18 and the number per cell 1.65.  The key 67 was 80%.  The patient's subsequent history is as detailed below.  INTERVAL HISTORY:  Amy Dawson is here today prior to receiving her first cycle of neoadjuvant chemotherapy.  She is starting on Doxorubicin/Cyclophosphamide given on day 1 of a 14 day cycle with Neulasta support.  She is doing well today.  Today is cycle 1 day 8.     REVIEW OF SYSTEMS: Laporchia  is doing moderately well today.  She tolerated her first cycle well.  She did have some fatigue and nausea from the treatment, but no vomiting.  She is eating and drinking well.  She is neutropenic today and denies fevers, chills, or any other concerns.  A detailed ROS is non contributory.     PAST MEDICAL HISTORY: Past Medical History:  Diagnosis Date  . Allergy   . Anxiety   . Chronic kidney disease    bladder issues  . Depression   . Fibromyalgia   . Headache    smokes marijuana for migraine pain  . Murmur, cardiac    as a child  . Stroke ([place]) 2000   numbness on left side  . TMJ (dislocation of temporomandibular joint)     PAST SURGICAL HISTORY: Past Surgical History:  Procedure Laterality Date  . CESAREAN SECTION     2  . ENDOMETRIAL ABLATION  2011  . PORTACATH PLACEMENT Right 05/31/2017   Procedure: INSERTION PORT-A-CATH WITH [place];  Surgeon: [name], MD;  Location: [facility];  Service: General;  Laterality: Right;    FAMILY HISTORY Family History  Problem Relation Age of Onset  . Liver cancer Sister   . Breast cancer Maternal Aunt   . Breast cancer Maternal Grandmother   . Colon cancer Neg Hx   . Esophageal cancer Neg Hx   . Rectal cancer Neg Hx   . Stomach cancer Neg Hx   The patient's parents are in their mid 47s as of November 2018.  The patient had 1 brother, 1 sister.  The sister died from a rare liver cancer at age 78.  A maternal aunt and a paternal grandmother were diagnosed with breast cancer in their 71s.  GYNECOLOGIC HISTORY:  No LMP recorded. Patient has had an ablation. Menarche age 36.  She underwent endometrial ablation 2011.  She is no longer having periods.  She never used hormone replacement.  First live birth was age 51.  She is GX P2.   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  Her husband [name] works as an Firefighter for [employer]  Daughter Marykate Heuberger attends [employer].  Son Shell Blanchette  is doing on apprenticeship in the [employer] program.    ADVANCED DIRECTIVES: Not in place   HEALTH MAINTENANCE: Social History   Tobacco Use  . Smoking status: Former Research scientist (life sciences)  . Smokeless tobacco: Never Used  Substance Use Topics  . Alcohol use: Yes    Alcohol/week: 0.0 oz    Comment: sometimes  . Drug use: Yes    Types: Marijuana    Comment: last smoked 05-29-17     Colonoscopy: 2016/[facility]  PAP: October 2018  Bone density: Never   Allergies  Allergen Reactions  . Other Other (See Comments)    Red wine & white cheese: migraine Raw egg whites- hives    Current Outpatient Medications  Medication Sig Dispense Refill  . amitriptyline (ELAVIL) 50 MG tablet Take 25 mg by mouth at bedtime as needed for sleep.    . Aspirin-Acetaminophen-Caffeine (EXCEDRIN PO) Take by mouth.    . Calcium Glycerophosphate (PRELIEF PO) Take 3 tablets by mouth.    . Cetirizine HCl (ZYRTEC ALLERGY PO) Take by mouth daily.    Marland Kitchen dexamethasone (DECADRON) 4 MG tablet Take 2 tablets by mouth once a day on the day after chemotherapy and then take 2 tablets two times a day for 2 days. Take with food. 30 tablet 1  . dimenhyDRINATE (DRAMAMINE) 50 MG tablet Take 50 mg every 8 (eight) hours as needed by mouth.    . Homeopathic Products (AZO YEAST PLUS PO) Take by mouth.    . lidocaine-prilocaine (EMLA) cream Apply to affected area once 30 g 3  . LORazepam (ATIVAN) 0.5 MG tablet Take 1 tablet (0.5 mg total) at bedtime as needed by mouth (Nausea or vomiting). 30 tablet 0  . nitrofurantoin, macrocrystal-monohydrate, (MACROBID) 100 MG capsule Take 1 capsule (100 mg total) 2 (two) times daily by mouth. 14 capsule 0  . Phenazopyridine HCl (AZO-STANDARD PO) Take by mouth.    . prochlorperazine (COMPAZINE) 10 MG tablet Take 1 tablet (10 mg total) every 6 (six) hours as needed by mouth (Nausea or vomiting). 30 tablet 1  . Pseudoephedrine HCl (SUDAFED PO) Take by mouth as needed.    .  Pumpkin Seed-Soy Germ (AZO BLADDER CONTROL/GO-LESS) CAPS Take by mouth.    . traMADol (ULTRAM) 50 MG tablet Take 2 tablets (100 mg total) every 6 (six) hours as needed by mouth. 10 tablet 0   No current facility-administered medications for this visit.     OBJECTIVE:   Vitals:   06/11/17 1029  BP: 122/87  Pulse: 77  Resp: 18  Temp: 97.8 F (36.6 C)  SpO2: 100%     Body mass index is 26.3 kg/m.   Wt Readings from Last 3 Encounters:  06/11/17 176 lb 12.8 oz (80.2 kg)  06/04/17 177 lb 8 oz (80.5 kg)  05/31/17 176 lb (79.8 kg)     ECOG  FS:1 - Symptomatic but completely ambulatory GENERAL: Patient is a well appearing female in no acute distress HEENT:  Sclerae anicteric. PERRL. Oropharynx clear and moist. No ulcerations or evidence of oropharyngeal candidiasis. Neck is supple.  NODES:  No cervical, supraclavicular, or axillary lymphadenopathy palpated.  BREAST EXAM:  Deferred. LUNGS:  Clear to auscultation bilaterally.  No wheezes or rhonchi. HEART:  Regular rate and rhythm. No murmur appreciated. ABDOMEN:  Soft, nontender.  Positive, normoactive bowel sounds. No organomegaly palpated. MSK:  No focal spinal tenderness to palpation. Full range of motion bilaterally in the upper extremities. EXTREMITIES:  No peripheral edema.   SKIN:  Clear with no obvious rashes or skin changes. No nail dyscrasia. NEURO:  Nonfocal. Well oriented.  Appropriate affect.     LAB RESULTS:  CMP     Component Value Date/Time   NA 139 06/04/2017 1117   K 3.8 06/04/2017 1117   CL 99 05/03/2014 0038   CO2 27 06/04/2017 1117   GLUCOSE 101 06/04/2017 1117   BUN 6.8 (L) 06/04/2017 1117   CREATININE 0.7 06/04/2017 1117   CALCIUM 8.8 06/04/2017 1117   PROT 7.1 06/04/2017 1117   ALBUMIN 3.8 06/04/2017 1117   AST 13 06/04/2017 1117   ALT 10 06/04/2017 1117   ALKPHOS 60 06/04/2017 1117   BILITOT 0.32 06/04/2017 1117   GFRNONAA >90 05/03/2014 0038   GFRAA >90 05/03/2014 0038    No results found  for: TOTALPROTELP, ALBUMINELP, A1GS, A2GS, BETS, BETA2SER, GAMS, MSPIKE, SPEI  No results found for: Nils Pyle, [facility]  Lab Results  Component Value Date   WBC 1.0 (L) 06/11/2017   NEUTROABS 0.2 (LL) 06/11/2017   HGB 13.2 06/11/2017   HCT 40.3 06/11/2017   MCV 92.3 06/11/2017   PLT 147 06/11/2017      Chemistry      Component Value Date/Time   NA 139 06/04/2017 1117   K 3.8 06/04/2017 1117   CL 99 05/03/2014 0038   CO2 27 06/04/2017 1117   BUN 6.8 (L) 06/04/2017 1117   CREATININE 0.7 06/04/2017 1117      Component Value Date/Time   CALCIUM 8.8 06/04/2017 1117   ALKPHOS 60 06/04/2017 1117   AST 13 06/04/2017 1117   ALT 10 06/04/2017 1117   BILITOT 0.32 06/04/2017 1117       No results found for: LABCA2  No components found for: [id number]  No results for input(s): INR in the last 168 hours.  No results found for: LABCA2  No results found for: ENM076  No results found for: KGS811  No results found for: SRP594  No results found for: CA2729  No components found for: HGQUANT  No results found for: CEA1 / No results found for: CEA1   No results found for: AFPTUMOR  No results found for: He[place]  No results found for: PSA1  Appointment on 06/11/2017  Component Date Value Ref Range Status  . WBC 06/11/2017 1.0* 3.9 - 10.3 10e3/uL Final  . NEUT# 06/11/2017 0.2* 1.5 - 6.5 10e3/uL Final  . HGB 06/11/2017 13.2  11.6 - 15.9 g/dL Final  . HCT 06/11/2017 40.3  34.8 - 46.6 % Final  . Platelets 06/11/2017 147  145 - 400 10e3/uL Final  . MCV 06/11/2017 92.3  79.5 - 101.0 fL Final  . MCH 06/11/2017 30.3  25.1 - 34.0 pg Final  . MCHC 06/11/2017 32.8  31.5 - 36.0 g/dL Final  . RBC 06/11/2017 4.37  3.70 - 5.45 10e6/uL Final  . RDW 06/11/2017  12.9  11.2 - 14.5 % Final  . lymph# 06/11/2017 0.7* 0.9 - 3.3 10e3/uL Final  . MONO# 06/11/2017 0.0* 0.1 - 0.9 10e3/uL Final  . Eosinophils Absolute 06/11/2017 0.1  0.0 - 0.5 10e3/uL Final  . Basophils  Absolute 06/11/2017 0.0  0.0 - 0.1 10e3/uL Final  . NEUT% 06/11/2017 17.5* 38.4 - 76.8 % Final  . LYMPH% 06/11/2017 71.3* 14.0 - 49.7 % Final  . MONO% 06/11/2017 4.3  0.0 - 14.0 % Final  . EOS% 06/11/2017 5.2  0.0 - 7.0 % Final  . BASO% 06/11/2017 1.7  0.0 - 2.0 % Final    (this displays the last labs from the last 3 days)  No results found for: TOTALPROTELP, ALBUMINELP, A1GS, A2GS, BETS, BETA2SER, GAMS, MSPIKE, SPEI (this displays SPEP labs)  No results found for: KPAFRELGTCHN, LAMBDASER, KAPLAMBRATIO (kappa/lambda light chains)  No results found for: HGBA, HGBA2QUANT, HGBFQUANT, HGBSQUAN (Hemoglobinopathy evaluation)   No results found for: LDH  No results found for: IRON, TIBC, IRONPCTSAT (Iron and TIBC)  No results found for: FERRITIN  Urinalysis    Component Value Date/Time   COLORURINE YELLOW 05/03/2014 0000   APPEARANCEUR CLEAR 05/03/2014 0000   LABSPEC 1.010 05/30/2017 1315   PHURINE Color Interference 05/30/2017 1315   PHURINE 6.0 05/03/2014 0000   GLUCOSEU Negative 05/30/2017 1315   HGBUR Trace 05/30/2017 1315   HGBUR TRACE (A) 05/03/2014 0000   BILIRUBINUR Color Interference 05/30/2017 1315   KETONESUR Color Interference 05/30/2017 1315   KETONESUR NEGATIVE 05/03/2014 0000   PROTEINUR Color Interference 05/30/2017 1315   PROTEINUR NEGATIVE 05/03/2014 0000   UROBILINOGEN Color Interference 05/30/2017 1315   NITRITE Color Interference 05/30/2017 1315   NITRITE NEGATIVE 05/03/2014 0000   LEUKOCYTESUR Color Interference 05/30/2017 1315     STUDIES: Mr Breast Bilateral W Wo Contrast  Result Date: 05/29/2017 CLINICAL DATA:  Known left breast cancer LABS:  None EXAM: BILATERAL BREAST MRI WITH AND WITHOUT CONTRAST TECHNIQUE: Multiplanar, multisequence MR images of both breasts were obtained prior to and following the intravenous administration of 16 ml of MultiHance. THREE-DIMENSIONAL MR IMAGE RENDERING ON INDEPENDENT WORKSTATION: Three-dimensional MR images  were rendered by post-processing of the original MR data on an independent workstation. The three-dimensional MR images were interpreted, and findings are reported in the following complete MRI report for this study. Three dimensional images were evaluated at the independent DynaCad workstation COMPARISON:  Previous exam(s). FINDINGS: Breast composition: c. Heterogeneous fibroglandular tissue. Background parenchymal enhancement: Moderate. Right breast: No suspicious masses or non mass enhancement in the right breast. There is a lateral cyst on the right. Left breast: The patient's known malignancy is identified centered at 12 o'clock in the left breast. The malignancy measures 3.6 by 2.8 by 3.2 cm. No other malignancy on the left. Lymph nodes: No abnormal lymph nodes are identified in the right axilla or the internal mammary lymph node chains. There is a lymph node in the high left axilla on T1 image 24 with retention of the fatty hilum and a cortex measuring 6 mm in thickness. Ancillary findings:  None. IMPRESSION: 1. Known malignancy centered at 12 o'clock in the left breast measuring 3.6 x 2.8 x 3.2 cm. 2. There is a prominent node in the high left axilla with a cortex measuring 6 mm. The fatty hilum is retained however. RECOMMENDATION: Recommend second-look ultrasound in the left axilla. If the lymph node with a thickened cortex measuring up to 6 mm is identified, recommend biopsy. Otherwise, recommend sentinel lymph node biopsy at  the time of surgery. BI-RADS CATEGORY  4: Suspicious. Electronically Signed   By: Dorise Bullion III M.D   On: 05/29/2017 12:25   Dg Chest Port 1 View  Result Date: 05/31/2017 CLINICAL DATA:  Porta catheter insertion on the right EXAM: PORTABLE CHEST 1 VIEW COMPARISON:  08/19/2009 FINDINGS: Porta catheter on the right with tip at the SVC. Negative for pneumothorax. Negative mediastinal contours accounting for mild aortic tortuosity. Mildly low lung volumes with interstitial  crowding. No edema or consolidation. IMPRESSION: Porta catheter with tip at the SVC.  Negative for pneumothorax. Electronically Signed   By: Monte Fantasia M.D.   On: 05/31/2017 13:44   Dg Fluoro Guide Cv Line-no Report  Result Date: 05/31/2017 Fluoroscopy was utilized by the requesting physician.  No radiographic interpretation.   Mm Clip Placement Left  Result Date: 05/14/2017 CLINICAL DATA:  Confirmation of clip placement after ultrasound-guided core needle biopsy of a highly suspicious mass in the upper outer quadrant of the left breast. EXAM: DIAGNOSTIC LEFT MAMMOGRAM POST ULTRASOUND BIOPSY COMPARISON:  Previous exam(s). FINDINGS: Mammographic images were obtained following ultrasound guided biopsy of a mass in the upper outer quadrant of the left breast. Of note, the mass was so firm that the introducer needle for the biopsy clip would not enter the mass and started to bend, so the clip was deployed just anterior to the mass. The ribbon shaped tissue marker clip is positioned approximately 5-6 mm anterior to the mass, corresponding to the site of deployment. Expected post biopsy changes are present without evidence of hematoma. IMPRESSION: Positioning of the ribbon shaped tissue marker clip approximately 5-6 mm anterior to the mass at the site of clip deployment - the mass was so firm that the introducer needle for the clip would not enter the mass without bending, so the clip was deployed anterior to the mass. Final Assessment: Post Procedure Mammograms for Marker Placement Electronically Signed   By: Evangeline Dakin M.D.   On: 05/14/2017 15:44   [place] Lt Breast Bx W Loc Dev 1st Lesion Img Bx Spec US Guide  Addendum Date: 05/16/2017   ADDENDUM REPORT: 05/16/2017 15:22 ADDENDUM: Pathology revealed GRADE II-III INVASIVE DUCTAL CARCINOMA of the Left breast, upper outer quadrant, 12:30 o'clock, 5 cm from the nipple. This was found to be concordant by Dr. Peggye Fothergill. Pathology results were  discussed with the patient by telephone. The patient reported doing well after the biopsy with tenderness at the site. Post biopsy instructions and care were reviewed and questions were answered. The patient was encouraged to call [facility] for any additional concerns. The patient was referred to [facility] on May 22, 2017. Pathology results reported by Terie Purser, RN on 05/16/2017. Electronically Signed   By: Evangeline Dakin M.D.   On: 05/16/2017 15:22   Result Date: 05/16/2017 CLINICAL DATA:  51 year old who presented with a palpable mass in the upper left breast, shown on diagnostic workup to have a highly suspicious 2.9 cm mass in the upper outer quadrant at the 12:30 o'clock position approximately 5 cm from the nipple. EXAM: ULTRASOUND GUIDED LEFT BREAST CORE NEEDLE BIOPSY COMPARISON:  Previous exam(s). FINDINGS: I met with the patient and we discussed the procedure of ultrasound-guided biopsy, including benefits and alternatives. We discussed the high likelihood of a successful procedure. We discussed the risks of the procedure, including infection, bleeding, tissue injury, clip migration, and inadequate sampling. Informed written consent  was given. The usual time-out protocol was performed immediately prior to the procedure. Lesion quadrant: Upper outer quadrant. Using sterile technique with chlorhexidine as skin antisepsis, 1% Lidocaine as local anesthetic, under direct ultrasound visualization, a 12 gauge Bard Marquee core needle device was used to perform biopsy of the 2.9 cm mass in the upper outer quadrant at the 12:30 o'clock position approximately 5 cm from the nipple using a lateral approach. At the conclusion of the procedure a ribbon tissue marker clip was deployed into the biopsy cavity. Follow up 2 view mammogram was performed and dictated separately. IMPRESSION: Ultrasound guided  biopsy of a 2.9 cm mass in the upper outer quadrant of the left breast. No apparent complications. Electronically Signed: By: Evangeline Dakin M.D. On: 05/14/2017 15:42    ELIGIBLE FOR AVAILABLE RESEARCH PROTOCOL: SWOG [id number] depending on response to neoadjuvant treatment, UPBEAT  ASSESSMENT: 51 y.o. [place] status post left breast upper outer quadrant biopsy May 14, 2017 for a clinical T2 N0, stage IIB invasive ductal carcinoma, grade 3, triple negative, with an MIB-1 of 80%  (1) neoadjuvant chemotherapy will consist of doxorubicin and cyclophosphamide in dose dense fashion x4 starting June 04, 2017, to be followed by weekly carboplatin and paclitaxel x12  (a) echo on 05/30/2017: LVEF 55-60%  (2) breast conserving surgery with sentinel lymph node sampling to follow  (3) adjuvant radiation to follow surgery  (4) genetics testing pending  PLAN: Cyndia is doing well today.  I reviewed her labs with her in detail.  She is neutropenic following chemotherapy, and this is to be expected.  She and I reviewed neutropenic precautions in detail and what to do if she develops a fever.  She verbalized understanding.  Her nausea is controlled and she seems to have tolerated chemotherapy well. Rosangelica will return in one week for labs, f/u and cycle 2 of Doxorubicin and Cyclophosphamide.     Raynah has a good understanding of the overall plan. She agrees with it. She knows the goal of treatment in her case is cure. She will call with any problems that may develop before her next visit here.  A total of (30) minutes of face-to-face time was spent with this patient with greater than 50% of that time in counseling and care-coordination.   Scot Dock, NP   06/11/2017 10:54 AM Medical Oncology and Hematology [facility] [address] [place], [place] [address] Tel. [phone]    Fax. [phone]

## 2017-06-11 NOTE — Telephone Encounter (Signed)
No 11/27 los. °

## 2017-06-12 ENCOUNTER — Encounter: Payer: Self-pay | Admitting: Adult Health

## 2017-06-12 ENCOUNTER — Other Ambulatory Visit: Payer: Self-pay | Admitting: Adult Health

## 2017-06-12 ENCOUNTER — Telehealth (HOSPITAL_COMMUNITY): Payer: Self-pay | Admitting: Vascular Surgery

## 2017-06-12 NOTE — Telephone Encounter (Signed)
Left pt message to make NP brst cancer appt w/ echo in FEB

## 2017-06-13 ENCOUNTER — Encounter: Payer: Self-pay | Admitting: Genetic Counselor

## 2017-06-13 ENCOUNTER — Other Ambulatory Visit: Payer: 59

## 2017-06-13 ENCOUNTER — Ambulatory Visit (HOSPITAL_BASED_OUTPATIENT_CLINIC_OR_DEPARTMENT_OTHER): Payer: 59 | Admitting: Genetic Counselor

## 2017-06-13 DIAGNOSIS — Z1379 Encounter for other screening for genetic and chromosomal anomalies: Secondary | ICD-10-CM

## 2017-06-13 DIAGNOSIS — Z803 Family history of malignant neoplasm of breast: Secondary | ICD-10-CM | POA: Insufficient documentation

## 2017-06-13 HISTORY — DX: Encounter for other screening for genetic and chromosomal anomalies: Z13.79

## 2017-06-13 NOTE — Progress Notes (Signed)
[facility]      Initial Visit   Patient Name: Amy Dawson Patient DOB: 07/03/1966 Patient Age: 51 y.o. Encounter Date: 06/13/2017  Referring Provider: [name], MD  Primary Care Provider: [name], MD  Reason for Visit: Evaluate for hereditary susceptibility to cancer    Assessment and Plan:  . Amy Dawson's history of triple negative breast cancer at age 56 is not highly suggestive of a hereditary predisposition to cancer, but her father's side of the family is very small and he had no sisters. This paucity of women makes risk assessment challenging. She meets NCCN criteria for genetic testing due to having triple negative breast cancer under age 49.  Marland Kitchen Testing is recommended to determine whether she has a pathogenic mutation that will impact her decision for surgery after chemotherapy as well as her screening and risk-reduction for cancer. A negative result will be reassuring.  . Amy Dawson wished to pursue genetic testing and a blood sample will be sent for analysis of the 23 genes on [facility]'s Breast/GYN panel (ATM, BARD1, BRCA1, BRCA2, BRIP1, CDH1, CHEK2, DICER1, EPCAM, MLH1,  MSH2, MSH6, NBN, NF1, PALB2, PMS2, PTEN, RAD50, RAD51C, RAD51D,SMARCA4, STK11, and TP53).   Marland Kitchen Results should be available in approximately 2-4 weeks, at which point we will contact her and address implications for her as well as address genetic testing for at-risk family members, if needed.     Dr. [name] was available for questions concerning this case. Total time spent by me in face-to-face counseling was approximately 30 minutes.   _____________________________________________________________________   History of Present Illness: Amy Dawson, a 51 y.o. female, is being seen at the [facility] due to a personal and family history of breast cancer. She presents to clinic today with her husband, Amy Dawson, to discuss the  possibility of a hereditary predisposition to cancer and discuss whether genetic testing is warranted.  Ms. Bumgardner was recently diagnosed with breast cancer at the age of 36. She is currently receiving neoadjuvant chemotherapy.  The breast tumor was ER negative, PR negative, and HER2 negative.   Past Medical History:  Diagnosis Date  . Allergy   . Anxiety   . Breast cancer ([place])   . Chronic kidney disease    bladder issues  . Depression   . Family history of breast cancer   . Fibromyalgia   . Headache    smokes marijuana for migraine pain  . Murmur, cardiac    as a child  . Stroke ([place]) 2000   numbness on left side  . TMJ (dislocation of temporomandibular joint)     Past Surgical History:  Procedure Laterality Date  . CESAREAN SECTION     2  . ENDOMETRIAL ABLATION  2011  . PORTACATH PLACEMENT Right 05/31/2017   Procedure: INSERTION PORT-A-CATH WITH [place];  Surgeon: [name], MD;  Location: [facility];  Service: General;  Laterality: Right;    Social History   Socioeconomic History  . Marital status: Married    Spouse name: Not on file  . Number of children: Not on file  . Years of education: Not on file  . Highest education level: Not on file  Social Needs  . Financial resource strain: Not on file  . Food insecurity - worry: Not on file  . Food insecurity - inability: Not on file  . Transportation needs - medical: Not on file  . Transportation needs -  non-medical: Not on file  Occupational History  . Not on file  Tobacco Use  . Smoking status: Former Research scientist (life sciences)  . Smokeless tobacco: Never Used  Substance and Sexual Activity  . Alcohol use: Yes    Alcohol/week: 0.0 oz    Comment: sometimes  . Drug use: Yes    Types: Marijuana    Comment: last smoked 05-29-17  . Sexual activity: Not on file  Other Topics Concern  . Not on file  Social History Narrative  . Not on file     Family History:  During the visit, a 4-generation pedigree was  obtained. Family tree will be scanned in the Media tab in Epic  Significant diagnoses include the following:  Family History  Problem Relation Age of Onset  . Liver cancer Sister        "rare type"; deceased 52  . Breast cancer Maternal Aunt        Dx 38s; currently 64s  . Breast cancer Paternal Grandmother        Dx 14s; deceased 23s  . Breast cancer Other        mat grandfather's sister; dx 82s; deceased 2s  . Cancer Paternal Aunt        unk. type; deceased 7  . Colon cancer Neg Hx   . Esophageal cancer Neg Hx   . Rectal cancer Neg Hx   . Stomach cancer Neg Hx     Additionally, Ms. Kreiter has a daughter (age 58) and a son (age 79). She has one brother (age 45) and her sister (noted above) who passed away. Her mother (age 47) is cancer-free and had a brother and sister. Her father (age 69) is cancer-free and had only 2 brothers.  Ms. Burdick ancestry is Caucasian - NOS. There is no known Jewish ancestry and no consanguinity.  Discussion: We reviewed the characteristics, features and inheritance patterns of hereditary cancer syndromes. We discussed her risk of harboring a mutation in the context of her personal and family history. We discussed that the paucity of women in her paternal family makes risk assessment challenging. We discussed the process of genetic testing, insurance coverage and implications of results: positive, negative and variant of unknown significance (VUS).    Ms. Hulgan questions were answered to her satisfaction today and she is welcome to call with any additional questions or concerns. Thank you for the referral and allowing [place] to share in the care of your patient.    Steele Berg, MS, [place] Certified Genetic Counselor phone: [phone] [email or web address]   ______________________________________________________________________ For Office Staff:  Number of people involved in session: 2 Was an Intern/ student involved with case: no

## 2017-06-14 ENCOUNTER — Other Ambulatory Visit: Payer: Self-pay | Admitting: *Deleted

## 2017-06-14 DIAGNOSIS — Z171 Estrogen receptor negative status [ER-]: Principal | ICD-10-CM

## 2017-06-14 DIAGNOSIS — C50412 Malignant neoplasm of upper-outer quadrant of left female breast: Secondary | ICD-10-CM

## 2017-06-18 ENCOUNTER — Ambulatory Visit: Payer: 59

## 2017-06-18 ENCOUNTER — Encounter: Payer: Self-pay | Admitting: General Practice

## 2017-06-18 ENCOUNTER — Ambulatory Visit (HOSPITAL_BASED_OUTPATIENT_CLINIC_OR_DEPARTMENT_OTHER): Payer: 59

## 2017-06-18 ENCOUNTER — Other Ambulatory Visit (HOSPITAL_BASED_OUTPATIENT_CLINIC_OR_DEPARTMENT_OTHER): Payer: 59

## 2017-06-18 ENCOUNTER — Ambulatory Visit (HOSPITAL_BASED_OUTPATIENT_CLINIC_OR_DEPARTMENT_OTHER): Payer: 59 | Admitting: Adult Health

## 2017-06-18 ENCOUNTER — Encounter: Payer: Self-pay | Admitting: Adult Health

## 2017-06-18 VITALS — BP 120/72 | HR 80 | Temp 98.3°F | Resp 20 | Ht 68.75 in | Wt 178.4 lb

## 2017-06-18 DIAGNOSIS — Z5111 Encounter for antineoplastic chemotherapy: Secondary | ICD-10-CM | POA: Diagnosis not present

## 2017-06-18 DIAGNOSIS — C50412 Malignant neoplasm of upper-outer quadrant of left female breast: Secondary | ICD-10-CM

## 2017-06-18 DIAGNOSIS — Z95828 Presence of other vascular implants and grafts: Secondary | ICD-10-CM

## 2017-06-18 DIAGNOSIS — Z171 Estrogen receptor negative status [ER-]: Secondary | ICD-10-CM

## 2017-06-18 LAB — COMPREHENSIVE METABOLIC PANEL
ALT: 21 U/L (ref 0–55)
AST: 14 U/L (ref 5–34)
Albumin: 3.5 g/dL (ref 3.5–5.0)
Alkaline Phosphatase: 79 U/L (ref 40–150)
Anion Gap: 7 mEq/L (ref 3–11)
BUN: 9 mg/dL (ref 7.0–26.0)
CO2: 27 meq/L (ref 22–29)
Calcium: 8.7 mg/dL (ref 8.4–10.4)
Chloride: 106 mEq/L (ref 98–109)
Creatinine: 0.7 mg/dL (ref 0.6–1.1)
GLUCOSE: 104 mg/dL (ref 70–140)
POTASSIUM: 3.9 meq/L (ref 3.5–5.1)
SODIUM: 139 meq/L (ref 136–145)
TOTAL PROTEIN: 6.7 g/dL (ref 6.4–8.3)

## 2017-06-18 LAB — CBC WITH DIFFERENTIAL/PLATELET
BASO%: 0.8 % (ref 0.0–2.0)
Basophils Absolute: 0 10e3/uL (ref 0.0–0.1)
EOS%: 0.3 % (ref 0.0–7.0)
Eosinophils Absolute: 0 10e3/uL (ref 0.0–0.5)
HCT: 36.8 % (ref 34.8–46.6)
HGB: 12.3 g/dL (ref 11.6–15.9)
LYMPH%: 30 % (ref 14.0–49.7)
MCH: 30.7 pg (ref 25.1–34.0)
MCHC: 33.6 g/dL (ref 31.5–36.0)
MCV: 91.4 fL (ref 79.5–101.0)
MONO#: 0.4 10e3/uL (ref 0.1–0.9)
MONO%: 8 % (ref 0.0–14.0)
NEUT#: 2.9 10e3/uL (ref 1.5–6.5)
NEUT%: 60.9 % (ref 38.4–76.8)
Platelets: 236 10e3/uL (ref 145–400)
RBC: 4.02 10e6/uL (ref 3.70–5.45)
RDW: 12.9 % (ref 11.2–14.5)
WBC: 4.8 10e3/uL (ref 3.9–10.3)
lymph#: 1.4 10e3/uL (ref 0.9–3.3)

## 2017-06-18 MED ORDER — SODIUM CHLORIDE 0.9% FLUSH
10.0000 mL | INTRAVENOUS | Status: DC | PRN
  Administered 2017-06-18: 10 mL via INTRAVENOUS
  Filled 2017-06-18: qty 10

## 2017-06-18 MED ORDER — PALONOSETRON HCL INJECTION 0.25 MG/5ML
0.2500 mg | Freq: Once | INTRAVENOUS | Status: AC
  Administered 2017-06-18: 0.25 mg via INTRAVENOUS

## 2017-06-18 MED ORDER — DOXORUBICIN HCL CHEMO IV INJECTION 2 MG/ML
60.0000 mg/m2 | Freq: Once | INTRAVENOUS | Status: AC
  Administered 2017-06-18: 118 mg via INTRAVENOUS
  Filled 2017-06-18: qty 59

## 2017-06-18 MED ORDER — SODIUM CHLORIDE 0.9 % IV SOLN
Freq: Once | INTRAVENOUS | Status: AC
  Administered 2017-06-18: 12:00:00 via INTRAVENOUS
  Filled 2017-06-18: qty 5

## 2017-06-18 MED ORDER — SODIUM CHLORIDE 0.9% FLUSH
10.0000 mL | INTRAVENOUS | Status: DC | PRN
  Administered 2017-06-18: 10 mL
  Filled 2017-06-18: qty 10

## 2017-06-18 MED ORDER — SODIUM CHLORIDE 0.9 % IV SOLN
600.0000 mg/m2 | Freq: Once | INTRAVENOUS | Status: AC
  Administered 2017-06-18: 1180 mg via INTRAVENOUS
  Filled 2017-06-18: qty 59

## 2017-06-18 MED ORDER — HEPARIN SOD (PORK) LOCK FLUSH 100 UNIT/ML IV SOLN
500.0000 [IU] | Freq: Once | INTRAVENOUS | Status: AC | PRN
  Administered 2017-06-18: 500 [IU]
  Filled 2017-06-18: qty 5

## 2017-06-18 MED ORDER — PALONOSETRON HCL INJECTION 0.25 MG/5ML
INTRAVENOUS | Status: AC
  Filled 2017-06-18: qty 5

## 2017-06-18 MED ORDER — SODIUM CHLORIDE 0.9 % IV SOLN
Freq: Once | INTRAVENOUS | Status: AC
  Administered 2017-06-18: 12:00:00 via INTRAVENOUS

## 2017-06-18 MED ORDER — PEGFILGRASTIM 6 MG/0.6ML ~~LOC~~ PSKT
6.0000 mg | PREFILLED_SYRINGE | Freq: Once | SUBCUTANEOUS | Status: DC
  Filled 2017-06-18: qty 0.6

## 2017-06-18 NOTE — Progress Notes (Signed)
[facility]  [phone] [phone]     ID: BRIEONNA CRUTCHER DOB: 1966/01/09  MR#: [id number]  SWN#:[id number]  Patient Care Team: [name], MD as PCP - General (Family Medicine) [name], [name], MD as Consulting Physician (Oncology) [name], MD as Consulting Physician (General Surgery) [name], MD as Consulting Physician (Radiation Oncology) [name], NP as Nurse Practitioner (Obstetrics and Gynecology) Scot Dock, NP OTHER MD:  CHIEF COMPLAINT: Triple negative breast cancer  CURRENT TREATMENT: Neoadjuvant chemotherapy   HISTORY OF CURRENT ILLNESS: [name] felt some discomfort in the left breast in June 2018, but did not think much of it.  However in October she palpated a change in the upper outer quadrant of her left breast and she brought this to medical attention.  On May 09, 2017 she had bilateral diagnostic mammography with tomography at the breast center, showing the breast density to be category capital C.  In the left breast that was not obscured mass in the upper outer quadrant, which by palpation was identified at the 1230 o'clock radiant 5 cm from the nipple.  By ultrasound this measured 2.9 cm, and it was irregular and hypoechoic.  The left axilla was sonographically benign.  Biopsy of the left breast mass in question May 14, 2017 showed [phone]) and invasive ductal carcinoma, grade 2 or more likely 3, estrogen and progesterone receptor negative, with no HER-2 amplification, the signals ratio being 1.18 and the number per cell 1.65.  The key 67 was 80%.  The patient's subsequent history is as detailed below.  INTERVAL HISTORY:  [name] is here today prior to receiving her first cycle of neoadjuvant chemotherapy.  She is starting on Doxorubicin/Cyclophosphamide given on day 1 of a 14 day cycle with Neulasta support.  She is doing well today.  Today is cycle 2 day 1.     REVIEW OF SYSTEMS: [name]  is doing well today.  She denies any issues.  Her scalp is itching, but otherwise she denies fevers, chills, nausea, vomiting, mucositis, or any other concerns.  She has an ultrasound of her axilla scheduled for next week.  She denies any questions about this and tells me she is planning on going to the appointment.  Otherwise, a detailed ros is non contributory.    PAST MEDICAL HISTORY: Past Medical History:  Diagnosis Date  . Allergy   . Anxiety   . Breast cancer ([place])   . Chronic kidney disease    bladder issues  . Depression   . Family history of breast cancer   . Fibromyalgia   . Headache    smokes marijuana for migraine pain  . Murmur, cardiac    as a child  . Stroke ([place]) 2000   numbness on left side  . TMJ (dislocation of temporomandibular joint)     PAST SURGICAL HISTORY: Past Surgical History:  Procedure Laterality Date  . CESAREAN SECTION     2  . ENDOMETRIAL ABLATION  2011  . PORTACATH PLACEMENT Right 05/31/2017   Procedure: INSERTION PORT-A-CATH WITH [place];  Surgeon: [name], MD;  Location: [facility];  Service: General;  Laterality: Right;    FAMILY HISTORY Family History  Problem Relation Age of Onset  . Liver cancer Sister        "rare type"; deceased 45  . Breast cancer Maternal Aunt        Dx 55s; currently 28s  . Breast cancer Paternal Grandmother  Dx 54s; deceased 67s  . Breast cancer Other        mat grandfather's sister; dx 17s; deceased 42s  . Cancer Paternal Aunt        unk. type; deceased 62  . Colon cancer Neg Hx   . Esophageal cancer Neg Hx   . Rectal cancer Neg Hx   . Stomach cancer Neg Hx   The patient's parents are in their mid 38s as of November 2018.  The patient had 1 brother, 1 sister.  The sister died from a rare liver cancer at age 90.  A maternal aunt and a paternal grandmother were diagnosed with breast cancer in their 72s.  GYNECOLOGIC HISTORY:  No LMP recorded. Patient has had an  ablation. Menarche age 41.  She underwent endometrial ablation 2011.  She is no longer having periods.  She never used hormone replacement.  First live birth was age 26.  She is GX P2.   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  Her husband [name] works as an Firefighter for [employer]  Daughter Kambree Krauss attends [employer].  Son Dawsyn Zurn is doing on apprenticeship in the [place] program.    ADVANCED DIRECTIVES: Not in place   HEALTH MAINTENANCE: Social History   Tobacco Use  . Smoking status: Former Research scientist (life sciences)  . Smokeless tobacco: Never Used  Substance Use Topics  . Alcohol use: Yes    Alcohol/week: 0.0 oz    Comment: sometimes  . Drug use: Yes    Types: Marijuana    Comment: last smoked 05-29-17     Colonoscopy: 2016/[facility]  PAP: October 2018  Bone density: Never   Allergies  Allergen Reactions  . Other Other (See Comments)    Red wine & white cheese: migraine Raw egg whites- hives    Current Outpatient Medications  Medication Sig Dispense Refill  . amitriptyline (ELAVIL) 50 MG tablet Take 25 mg by mouth at bedtime as needed for sleep.    . Aspirin-Acetaminophen-Caffeine (EXCEDRIN PO) Take by mouth.    . Calcium Glycerophosphate (PRELIEF PO) Take 3 tablets by mouth.    . Cetirizine HCl (ZYRTEC ALLERGY PO) Take by mouth daily.    Marland Kitchen dexamethasone (DECADRON) 4 MG tablet Take 2 tablets by mouth once a day on the day after chemotherapy and then take 2 tablets two times a day for 2 days. Take with food. 30 tablet 1  . dimenhyDRINATE (DRAMAMINE) 50 MG tablet Take 50 mg every 8 (eight) hours as needed by mouth.    . Homeopathic Products (AZO YEAST PLUS PO) Take by mouth.    . lidocaine-prilocaine (EMLA) cream Apply to affected area once 30 g 3  . LORazepam (ATIVAN) 0.5 MG tablet Take 1 tablet (0.5 mg total) at bedtime as needed by mouth (Nausea or vomiting). 30 tablet 0  . nitrofurantoin, macrocrystal-monohydrate,  (MACROBID) 100 MG capsule Take 1 capsule (100 mg total) 2 (two) times daily by mouth. 14 capsule 0  . Phenazopyridine HCl (AZO-STANDARD PO) Take by mouth.    . prochlorperazine (COMPAZINE) 10 MG tablet Take 1 tablet (10 mg total) every 6 (six) hours as needed by mouth (Nausea or vomiting). 30 tablet 1  . Pseudoephedrine HCl (SUDAFED PO) Take by mouth as needed.    . Pumpkin Seed-Soy Germ (AZO BLADDER CONTROL/GO-LESS) CAPS Take by mouth.    . traMADol (ULTRAM) 50 MG tablet Take 2 tablets (100 mg total) every 6 (six) hours as needed  by mouth. 10 tablet 0   No current facility-administered medications for this visit.     OBJECTIVE:   Vitals:   06/18/17 1115  BP: 120/72  Pulse: 80  Resp: 20  Temp: 98.3 F (36.8 C)  SpO2: 100%     Body mass index is 26.54 kg/m.   Wt Readings from Last 3 Encounters:  06/18/17 178 lb 6.4 oz (80.9 kg)  06/11/17 176 lb 12.8 oz (80.2 kg)  06/04/17 177 lb 8 oz (80.5 kg)     ECOG FS:1 - Symptomatic but completely ambulatory GENERAL: Patient is a well appearing female in no acute distress HEENT:  Sclerae anicteric. PERRL. Oropharynx clear and moist. No ulcerations or evidence of oropharyngeal candidiasis. Neck is supple.  NODES:  No cervical, supraclavicular, or axillary lymphadenopathy palpated.  BREAST EXAM:  Deferred. LUNGS:  Clear to auscultation bilaterally.  No wheezes or rhonchi. HEART:  Regular rate and rhythm. No murmur appreciated. ABDOMEN:  Soft, nontender.  Positive, normoactive bowel sounds. No organomegaly palpated. MSK:  No focal spinal tenderness to palpation. Full range of motion bilaterally in the upper extremities. EXTREMITIES:  No peripheral edema.   SKIN:  Clear with no obvious rashes or skin changes. No nail dyscrasia. NEURO:  Nonfocal. Well oriented.  Appropriate affect.     LAB RESULTS:  CMP     Component Value Date/Time   NA 136 06/11/2017 1014   K 3.9 06/11/2017 1014   CL 99 05/03/2014 0038   CO2 26 06/11/2017 1014    GLUCOSE 115 06/11/2017 1014   BUN 13.4 06/11/2017 1014   CREATININE 0.7 06/11/2017 1014   CALCIUM 8.8 06/11/2017 1014   PROT 6.7 06/11/2017 1014   ALBUMIN 3.4 (L) 06/11/2017 1014   AST 10 06/11/2017 1014   ALT 11 06/11/2017 1014   ALKPHOS 73 06/11/2017 1014   BILITOT 0.27 06/11/2017 1014   GFRNONAA >90 05/03/2014 0038   GFRAA >90 05/03/2014 0038    No results found for: TOTALPROTELP, ALBUMINELP, A1GS, A2GS, BETS, BETA2SER, GAMS, MSPIKE, SPEI  No results found for: Nils Pyle, [facility]  Lab Results  Component Value Date   WBC 4.8 06/18/2017   NEUTROABS 2.9 06/18/2017   HGB 12.3 06/18/2017   HCT 36.8 06/18/2017   MCV 91.4 06/18/2017   PLT 236 06/18/2017      Chemistry      Component Value Date/Time   NA 136 06/11/2017 1014   K 3.9 06/11/2017 1014   CL 99 05/03/2014 0038   CO2 26 06/11/2017 1014   BUN 13.4 06/11/2017 1014   CREATININE 0.7 06/11/2017 1014      Component Value Date/Time   CALCIUM 8.8 06/11/2017 1014   ALKPHOS 73 06/11/2017 1014   AST 10 06/11/2017 1014   ALT 11 06/11/2017 1014   BILITOT 0.27 06/11/2017 1014       No results found for: LABCA2  No components found for: GNOIBB048  No results for input(s): INR in the last 168 hours.  No results found for: LABCA2  No results found for: GQB169  No results found for: IHW388  No results found for: EKC003  No results found for: CA2729  No components found for: HGQUANT  No results found for: CEA1 / No results found for: CEA1   No results found for: AFPTUMOR  No results found for: CHROMOGRNA  No results found for: PSA1  Appointment on 06/18/2017  Component Date Value Ref Range Status  . WBC 06/18/2017 4.8  3.9 - 10.3 10e3/uL Final  .  NEUT# 06/18/2017 2.9  1.5 - 6.5 10e3/uL Final  . HGB 06/18/2017 12.3  11.6 - 15.9 g/dL Final  . HCT 06/18/2017 36.8  34.8 - 46.6 % Final  . Platelets 06/18/2017 236  145 - 400 10e3/uL Final  . MCV 06/18/2017 91.4  79.5 - 101.0 fL Final  .  MCH 06/18/2017 30.7  25.1 - 34.0 pg Final  . MCHC 06/18/2017 33.6  31.5 - 36.0 g/dL Final  . RBC 06/18/2017 4.02  3.70 - 5.45 10e6/uL Final  . RDW 06/18/2017 12.9  11.2 - 14.5 % Final  . lymph# 06/18/2017 1.4  0.9 - 3.3 10e3/uL Final  . MONO# 06/18/2017 0.4  0.1 - 0.9 10e3/uL Final  . Eosinophils Absolute 06/18/2017 0.0  0.0 - 0.5 10e3/uL Final  . Basophils Absolute 06/18/2017 0.0  0.0 - 0.1 10e3/uL Final  . NEUT% 06/18/2017 60.9  38.4 - 76.8 % Final  . LYMPH% 06/18/2017 30.0  14.0 - 49.7 % Final  . MONO% 06/18/2017 8.0  0.0 - 14.0 % Final  . EOS% 06/18/2017 0.3  0.0 - 7.0 % Final  . BASO% 06/18/2017 0.8  0.0 - 2.0 % Final    (this displays the last labs from the last 3 days)  No results found for: TOTALPROTELP, ALBUMINELP, A1GS, A2GS, BETS, BETA2SER, GAMS, MSPIKE, SPEI (this displays SPEP labs)  No results found for: KPAFRELGTCHN, LAMBDASER, KAPLAMBRATIO (kappa/lambda light chains)  No results found for: HGBA, HGBA2QUANT, HGBFQUANT, HGBSQUAN (Hemoglobinopathy evaluation)   No results found for: LDH  No results found for: IRON, TIBC, IRONPCTSAT (Iron and TIBC)  No results found for: FERRITIN  Urinalysis    Component Value Date/Time   COLORURINE YELLOW 05/03/2014 0000   APPEARANCEUR CLEAR 05/03/2014 0000   LABSPEC 1.010 05/30/2017 1315   PHURINE Color Interference 05/30/2017 1315   PHURINE 6.0 05/03/2014 0000   GLUCOSEU Negative 05/30/2017 1315   HGBUR Trace 05/30/2017 1315   HGBUR TRACE (A) 05/03/2014 0000   BILIRUBINUR Color Interference 05/30/2017 1315   KETONESUR Color Interference 05/30/2017 1315   KETONESUR NEGATIVE 05/03/2014 0000   PROTEINUR Color Interference 05/30/2017 1315   PROTEINUR NEGATIVE 05/03/2014 0000   UROBILINOGEN Color Interference 05/30/2017 1315   NITRITE Color Interference 05/30/2017 1315   NITRITE NEGATIVE 05/03/2014 0000   LEUKOCYTESUR Color Interference 05/30/2017 1315     STUDIES: Mr Breast Bilateral W Wo Contrast  Result Date:  05/29/2017 CLINICAL DATA:  Known left breast cancer LABS:  None EXAM: BILATERAL BREAST MRI WITH AND WITHOUT CONTRAST TECHNIQUE: Multiplanar, multisequence MR images of both breasts were obtained prior to and following the intravenous administration of 16 ml of MultiHance. THREE-DIMENSIONAL MR IMAGE RENDERING ON INDEPENDENT WORKSTATION: Three-dimensional MR images were rendered by post-processing of the original MR data on an independent workstation. The three-dimensional MR images were interpreted, and findings are reported in the following complete MRI report for this study. Three dimensional images were evaluated at the independent DynaCad workstation COMPARISON:  Previous exam(s). FINDINGS: Breast composition: c. Heterogeneous fibroglandular tissue. Background parenchymal enhancement: Moderate. Right breast: No suspicious masses or non mass enhancement in the right breast. There is a lateral cyst on the right. Left breast: The patient's known malignancy is identified centered at 12 o'clock in the left breast. The malignancy measures 3.6 by 2.8 by 3.2 cm. No other malignancy on the left. Lymph nodes: No abnormal lymph nodes are identified in the right axilla or the internal mammary lymph node chains. There is a lymph node in the high left axilla on T1  image 24 with retention of the fatty hilum and a cortex measuring 6 mm in thickness. Ancillary findings:  None. IMPRESSION: 1. Known malignancy centered at 12 o'clock in the left breast measuring 3.6 x 2.8 x 3.2 cm. 2. There is a prominent node in the high left axilla with a cortex measuring 6 mm. The fatty hilum is retained however. RECOMMENDATION: Recommend second-look ultrasound in the left axilla. If the lymph node with a thickened cortex measuring up to 6 mm is identified, recommend biopsy. Otherwise, recommend sentinel lymph node biopsy at the time of surgery. BI-RADS CATEGORY  4: Suspicious. Electronically Signed   By: Dorise Bullion III M.D   On: 05/29/2017  12:25   Dg Chest Port 1 View  Result Date: 05/31/2017 CLINICAL DATA:  Porta catheter insertion on the right EXAM: PORTABLE CHEST 1 VIEW COMPARISON:  08/19/2009 FINDINGS: Porta catheter on the right with tip at the SVC. Negative for pneumothorax. Negative mediastinal contours accounting for mild aortic tortuosity. Mildly low lung volumes with interstitial crowding. No edema or consolidation. IMPRESSION: Porta catheter with tip at the SVC.  Negative for pneumothorax. Electronically Signed   By: Monte Fantasia M.D.   On: 05/31/2017 13:44   Dg Fluoro Guide Cv Line-no Report  Result Date: 05/31/2017 Fluoroscopy was utilized by the requesting physician.  No radiographic interpretation.    ELIGIBLE FOR AVAILABLE RESEARCH PROTOCOL: SWOG [id number] depending on response to neoadjuvant treatment, UPBEAT  ASSESSMENT: 51 y.o. [place] status post left breast upper outer quadrant biopsy May 14, 2017 for a clinical T2 N0, stage IIB invasive ductal carcinoma, grade 3, triple negative, with an MIB-1 of 80%  (1) neoadjuvant chemotherapy will consist of doxorubicin and cyclophosphamide in dose dense fashion x4 starting June 04, 2017, to be followed by weekly carboplatin and paclitaxel x12  (a) echo on 05/30/2017: LVEF 55-60%  (2) breast conserving surgery with sentinel lymph node sampling to follow  (3) adjuvant radiation to follow surgery  (4) genetics testing pending  PLAN: Lorelai is doing well today.  Her labs are normal.  She will proceed with cycle 2 of chemotherapy with Doxorubicin and Cyclophosphamide.  She denies any issues. She will return in one week for labs and follow up.    Laveta has a good understanding of the overall plan. She agrees with it. She will call with any problems that may develop before her next visit here.  A total of (20) minutes of face-to-face time was spent with this patient with greater than 50% of that time in counseling and care-coordination.   Scot Dock,  NP   06/18/2017 11:19 AM Medical Oncology and Hematology [facility] [address] [place], [place] [address] Tel. [phone]    Fax. [phone]

## 2017-06-18 NOTE — Patient Instructions (Signed)
[facility] Discharge Instructions for Patients Receiving Chemotherapy  Today you received the following chemotherapy agents Adriamycin and Cytoxan  To help prevent nausea and vomiting after your treatment, we encourage you to take your nausea medication as directed.  If you develop nausea and vomiting that is not controlled by your nausea medication, call the clinic.   BELOW ARE SYMPTOMS THAT SHOULD BE REPORTED IMMEDIATELY:  *FEVER GREATER THAN 100.5 F  *CHILLS WITH OR WITHOUT FEVER  NAUSEA AND VOMITING THAT IS NOT CONTROLLED WITH YOUR NAUSEA MEDICATION  *UNUSUAL SHORTNESS OF BREATH  *UNUSUAL BRUISING OR BLEEDING  TENDERNESS IN MOUTH AND THROAT WITH OR WITHOUT PRESENCE OF ULCERS  *URINARY PROBLEMS  *BOWEL PROBLEMS  UNUSUAL RASH Items with * indicate a potential emergency and should be followed up as soon as possible.  Feel free to call the clinic should you have any questions or concerns. The clinic phone number is [phone].  Please show the CHEMO ALERT CARD at check-in to the Emergency Department and triage nurse.   

## 2017-06-18 NOTE — Patient Instructions (Signed)
Implanted Port Home Guide An implanted port is a type of central line that is placed under the skin. Central lines are used to provide IV access when treatment or nutrition needs to be given through a person's veins. Implanted ports are used for long-term IV access. An implanted port may be placed because:  You need IV medicine that would be irritating to the small veins in your hands or arms.  You need long-term IV medicines, such as antibiotics.  You need IV nutrition for a long period.  You need frequent blood draws for lab tests.  You need dialysis.  Implanted ports are usually placed in the chest area, but they can also be placed in the upper arm, the abdomen, or the leg. An implanted port has two main parts:  Reservoir. The reservoir is round and will appear as a small, raised area under your skin. The reservoir is the part where a needle is inserted to give medicines or draw blood.  Catheter. The catheter is a thin, flexible tube that extends from the reservoir. The catheter is placed into a large vein. Medicine that is inserted into the reservoir goes into the catheter and then into the vein.  How will I care for my incision site? Do not get the incision site wet. Bathe or shower as directed by your health care provider. How is my port accessed? Special steps must be taken to access the port:  Before the port is accessed, a numbing cream can be placed on the skin. This helps numb the skin over the port site.  Your health care provider uses a sterile technique to access the port. ? Your health care provider must put on a mask and sterile gloves. ? The skin over your port is cleaned carefully with an antiseptic and allowed to dry. ? The port is gently pinched between sterile gloves, and a needle is inserted into the port.  Only "non-coring" port needles should be used to access the port. Once the port is accessed, a blood return should be checked. This helps ensure that the port  is in the vein and is not clogged.  If your port needs to remain accessed for a constant infusion, a clear (transparent) bandage will be placed over the needle site. The bandage and needle will need to be changed every week, or as directed by your health care provider.  Keep the bandage covering the needle clean and dry. Do not get it wet. Follow your health care provider's instructions on how to take a shower or bath while the port is accessed.  If your port does not need to stay accessed, no bandage is needed over the port.  What is flushing? Flushing helps keep the port from getting clogged. Follow your health care provider's instructions on how and when to flush the port. Ports are usually flushed with saline solution or a medicine called heparin. The need for flushing will depend on how the port is used.  If the port is used for intermittent medicines or blood draws, the port will need to be flushed: ? After medicines have been given. ? After blood has been drawn. ? As part of routine maintenance.  If a constant infusion is running, the port may not need to be flushed.  How long will my port stay implanted? The port can stay in for as long as your health care provider thinks it is needed. When it is time for the port to come out, surgery will be   done to remove it. The procedure is similar to the one performed when the port was put in. When should I seek immediate medical care? When you have an implanted port, you should seek immediate medical care if:  You notice a bad smell coming from the incision site.  You have swelling, redness, or drainage at the incision site.  You have more swelling or pain at the port site or the surrounding area.  You have a fever that is not controlled with medicine.  This information is not intended to replace advice given to you by your health care provider. Make sure you discuss any questions you have with your health care provider. Document  Released: 07/02/2005 Document Revised: 12/08/2015 Document Reviewed: 03/09/2013 Elsevier Interactive Patient Education  2017 Elsevier Inc.  

## 2017-06-18 NOTE — Progress Notes (Signed)
[facility] Spiritual Care Note  Met with Amy Dawson briefly in infusion. She states that she has good support from family, for which she is grateful; she is aware that many people have less support, no health insurance, and many other barriers to QOL. The state of the world (justice, access, profiteering, etc) is a significant concern for her, which may affect her outlook, even though her gratitude itself is a positive coping tool. Per pt, she has no spiritual needs at this time. She does plan to utilize her two free table massages and is aware of ongoing [place] team and programming availability, but please also page if immediate needs arise. Thank you.   [place], [place], [facility] Pager [phone] Voicemail [phone]

## 2017-06-19 ENCOUNTER — Encounter: Payer: Self-pay | Admitting: Genetic Counselor

## 2017-06-19 ENCOUNTER — Other Ambulatory Visit: Payer: 59

## 2017-06-19 ENCOUNTER — Ambulatory Visit: Payer: Self-pay | Admitting: Genetic Counselor

## 2017-06-19 ENCOUNTER — Ambulatory Visit (HOSPITAL_BASED_OUTPATIENT_CLINIC_OR_DEPARTMENT_OTHER): Payer: 59

## 2017-06-19 VITALS — BP 140/78 | HR 92 | Temp 98.0°F | Resp 18

## 2017-06-19 DIAGNOSIS — Z5189 Encounter for other specified aftercare: Secondary | ICD-10-CM | POA: Diagnosis not present

## 2017-06-19 DIAGNOSIS — Z171 Estrogen receptor negative status [ER-]: Principal | ICD-10-CM

## 2017-06-19 DIAGNOSIS — C50412 Malignant neoplasm of upper-outer quadrant of left female breast: Secondary | ICD-10-CM

## 2017-06-19 DIAGNOSIS — Z1379 Encounter for other screening for genetic and chromosomal anomalies: Secondary | ICD-10-CM

## 2017-06-19 MED ORDER — PEGFILGRASTIM INJECTION 6 MG/0.6ML ~~LOC~~
6.0000 mg | PREFILLED_SYRINGE | Freq: Once | SUBCUTANEOUS | Status: AC
  Administered 2017-06-19: 6 mg via SUBCUTANEOUS
  Filled 2017-06-19: qty 0.6

## 2017-06-19 NOTE — Patient Instructions (Signed)
Pegfilgrastim injection What is this medicine? PEGFILGRASTIM (PEG fil gra stim) is a long-acting granulocyte colony-stimulating factor that stimulates the growth of neutrophils, a type of white blood cell important in the body's fight against infection. It is used to reduce the incidence of fever and infection in patients with certain types of cancer who are receiving chemotherapy that affects the bone marrow, and to increase survival after being exposed to high doses of radiation. This medicine may be used for other purposes; ask your health care provider or pharmacist if you have questions. COMMON BRAND NAME(S): Neulasta What should I tell my health care provider before I take this medicine? They need to know if you have any of these conditions: -kidney disease -latex allergy -ongoing radiation therapy -sickle cell disease -skin reactions to acrylic adhesives (On-Body Injector only) -an unusual or allergic reaction to pegfilgrastim, filgrastim, other medicines, foods, dyes, or preservatives -pregnant or trying to get pregnant -breast-feeding How should I use this medicine? This medicine is for injection under the skin. If you get this medicine at home, you will be taught how to prepare and give the pre-filled syringe or how to use the On-body Injector. Refer to the patient Instructions for Use for detailed instructions. Use exactly as directed. Tell your healthcare provider immediately if you suspect that the On-body Injector may not have performed as intended or if you suspect the use of the On-body Injector resulted in a missed or partial dose. It is important that you put your used needles and syringes in a special sharps container. Do not put them in a trash can. If you do not have a sharps container, call your pharmacist or healthcare provider to get one. Talk to your pediatrician regarding the use of this medicine in children. While this drug may be prescribed for selected conditions,  precautions do apply. Overdosage: If you think you have taken too much of this medicine contact a poison control center or emergency room at once. NOTE: This medicine is only for you. Do not share this medicine with others. What if I miss a dose? It is important not to miss your dose. Call your doctor or health care professional if you miss your dose. If you miss a dose due to an On-body Injector failure or leakage, a new dose should be administered as soon as possible using a single prefilled syringe for manual use. What may interact with this medicine? Interactions have not been studied. Give your health care provider a list of all the medicines, herbs, non-prescription drugs, or dietary supplements you use. Also tell them if you smoke, drink alcohol, or use illegal drugs. Some items may interact with your medicine. This list may not describe all possible interactions. Give your health care provider a list of all the medicines, herbs, non-prescription drugs, or dietary supplements you use. Also tell them if you smoke, drink alcohol, or use illegal drugs. Some items may interact with your medicine. What should I watch for while using this medicine? You may need blood work done while you are taking this medicine. If you are going to need a MRI, CT scan, or other procedure, tell your doctor that you are using this medicine (On-Body Injector only). What side effects may I notice from receiving this medicine? Side effects that you should report to your doctor or health care professional as soon as possible: -allergic reactions like skin rash, itching or hives, swelling of the face, lips, or tongue -dizziness -fever -pain, redness, or irritation at site   where injected -pinpoint red spots on the skin -red or dark-brown urine -shortness of breath or breathing problems -stomach or side pain, or pain at the shoulder -swelling -tiredness -trouble passing urine or change in the amount of urine Side  effects that usually do not require medical attention (report to your doctor or health care professional if they continue or are bothersome): -bone pain -muscle pain This list may not describe all possible side effects. Call your doctor for medical advice about side effects. You may report side effects to FDA at 1-800-FDA-1088. Where should I keep my medicine? Keep out of the reach of children. Store pre-filled syringes in a refrigerator between 2 and 8 degrees C (36 and 46 degrees F). Do not freeze. Keep in carton to protect from light. Throw away this medicine if it is left out of the refrigerator for more than 48 hours. Throw away any unused medicine after the expiration date. NOTE: This sheet is a summary. It may not cover all possible information. If you have questions about this medicine, talk to your doctor, pharmacist, or health care provider.  2018 Elsevier/Gold Standard (2016-06-28 12:58:03)  

## 2017-06-19 NOTE — Progress Notes (Signed)
Cancer Genetics Clinic       Genetic Test Results    Patient Name: Amy Dawson Patient DOB: 1965-09-23 Patient Age: 51 y.o. Encounter Date: 06/19/2017  Referring Provider: Lurline Del, MD  Primary Care Provider: [name], MD   Amy Dawson was called today to discuss genetic test results. Please see the Genetics note from Amy Dawson visit on 06/13/2017 for a detailed discussion of Amy Dawson personal and family history.  Genetic Testing: At the time of Amy Dawson's visit, she decided to pursue genetic testing of multiple genes associated with hereditary susceptibility to breast and gynecologic cancers. Testing included sequencing and deletion/duplication analysis. Testing did not reveal any pathogenic mutation in any of these genes.  A copy of the genetic test report will be scanned into Epic under the media tab.  The genes analyzed were the 23 genes on [facility]'s Breast/GYN panel (ATM, BARD1, BRCA1, BRCA2, BRIP1, CDH1, CHEK2, DICER1, EPCAM, MLH1,  MSH2, MSH6, NBN, NF1, PALB2, PMS2, PTEN, RAD50, RAD51C, RAD51D,SMARCA4, STK11, and TP53).  Since the current test is not perfect, it is possible that there may be a gene mutation that current testing cannot detect, but that chance is small. It is possible that a different genetic factor, which has not yet been discovered or is not on this panel, is responsible for the cancer diagnoses in the family. Again, the likelihood of this is low. No additional testing is recommended at this time for Amy Dawson.  Cancer Screening: These results suggest that Amy Dawson's cancer was most likely not due to an inherited predisposition. Most cancers happen by chance and this test, along with details of Amy Dawson family history, suggests that Amy Dawson cancer falls into this category. We discussed continuing to follow the cancer screening guidelines provided by Amy Dawson physician.   Family Members: Family members are at some increased risk of developing  cancer, over the general population risk, simply due to the family history. Women are recommended to have a yearly mammogram beginning at age 40, a yearly clinical breast exam, a yearly gynecologic exam and perform monthly breast self-exams. Colon cancer screening is recommended to begin by age 70 in both men and women, unless there is a family history of colon cancer or colon polyps or an individual has a personal history to warrant initiating screening at a younger age.  Any relative who had cancer at a young age or had a particularly rare cancer may also wish to pursue genetic testing. Genetic counselors can be located in other cities, by visiting the website of the [employer] ([employer]) and Field seismologist for a Dietitian by zip code.    Lastly, cancer genetics is a rapidly advancing field and it is possible that new genetic tests will be appropriate for Amy Dawson in the future. We encourage Amy Dawson to remain in contact with [place] on an annual basis so we can update Amy Dawson personal and family histories, and let Amy Dawson know of advances in cancer genetics that may benefit the family. Our contact number was provided. Amy Dawson is welcome to call anytime with additional questions.     Steele Berg, MS, [place] Certified Genetic Counselor phone: [phone]

## 2017-06-25 ENCOUNTER — Ambulatory Visit: Payer: 59 | Admitting: Adult Health

## 2017-06-25 ENCOUNTER — Other Ambulatory Visit: Payer: 59

## 2017-06-26 ENCOUNTER — Ambulatory Visit
Admission: RE | Admit: 2017-06-26 | Discharge: 2017-06-26 | Disposition: A | Discharge status: Home / Self Care | Payer: 59 | Source: Ambulatory Visit | Attending: Oncology | Admitting: Oncology

## 2017-06-26 DIAGNOSIS — C50412 Malignant neoplasm of upper-outer quadrant of left female breast: Secondary | ICD-10-CM

## 2017-06-26 DIAGNOSIS — Z171 Estrogen receptor negative status [ER-]: Principal | ICD-10-CM

## 2017-07-02 ENCOUNTER — Encounter: Payer: Self-pay | Admitting: *Deleted

## 2017-07-02 ENCOUNTER — Encounter: Payer: Self-pay | Admitting: Adult Health

## 2017-07-02 ENCOUNTER — Ambulatory Visit: Payer: 59 | Admitting: Adult Health

## 2017-07-02 ENCOUNTER — Ambulatory Visit (HOSPITAL_BASED_OUTPATIENT_CLINIC_OR_DEPARTMENT_OTHER): Payer: 59

## 2017-07-02 ENCOUNTER — Other Ambulatory Visit (HOSPITAL_BASED_OUTPATIENT_CLINIC_OR_DEPARTMENT_OTHER): Payer: 59

## 2017-07-02 ENCOUNTER — Ambulatory Visit: Payer: 59

## 2017-07-02 VITALS — BP 142/87 | HR 77 | Temp 98.2°F | Resp 18 | Ht 68.75 in | Wt 175.6 lb

## 2017-07-02 DIAGNOSIS — Z5111 Encounter for antineoplastic chemotherapy: Secondary | ICD-10-CM | POA: Diagnosis not present

## 2017-07-02 DIAGNOSIS — Z171 Estrogen receptor negative status [ER-]: Secondary | ICD-10-CM

## 2017-07-02 DIAGNOSIS — C50412 Malignant neoplasm of upper-outer quadrant of left female breast: Secondary | ICD-10-CM | POA: Diagnosis not present

## 2017-07-02 DIAGNOSIS — Z006 Encounter for examination for normal comparison and control in clinical research program: Secondary | ICD-10-CM | POA: Diagnosis not present

## 2017-07-02 DIAGNOSIS — Z95828 Presence of other vascular implants and grafts: Secondary | ICD-10-CM

## 2017-07-02 LAB — CBC WITH DIFFERENTIAL/PLATELET
BASO%: 0.7 % (ref 0.0–2.0)
BASOS ABS: 0 10*3/uL (ref 0.0–0.1)
EOS%: 0.1 % (ref 0.0–7.0)
Eosinophils Absolute: 0 10*3/uL (ref 0.0–0.5)
HCT: 37.5 % (ref 34.8–46.6)
HEMOGLOBIN: 12.5 g/dL (ref 11.6–15.9)
LYMPH#: 1.3 10*3/uL (ref 0.9–3.3)
LYMPH%: 23 % (ref 14.0–49.7)
MCH: 30.7 pg (ref 25.1–34.0)
MCHC: 33.4 g/dL (ref 31.5–36.0)
MCV: 91.8 fL (ref 79.5–101.0)
MONO#: 0.5 10*3/uL (ref 0.1–0.9)
MONO%: 9.3 % (ref 0.0–14.0)
NEUT#: 3.9 10*3/uL (ref 1.5–6.5)
NEUT%: 66.9 % (ref 38.4–76.8)
Platelets: 222 10*3/uL (ref 145–400)
RBC: 4.08 10*6/uL (ref 3.70–5.45)
RDW: 13.7 % (ref 11.2–14.5)
WBC: 5.8 10*3/uL (ref 3.9–10.3)

## 2017-07-02 LAB — COMPREHENSIVE METABOLIC PANEL
ALBUMIN: 3.8 g/dL (ref 3.5–5.0)
ALK PHOS: 84 U/L (ref 40–150)
ALT: 11 U/L (ref 0–55)
AST: 13 U/L (ref 5–34)
Anion Gap: 9 mEq/L (ref 3–11)
BUN: 8.8 mg/dL (ref 7.0–26.0)
CO2: 24 meq/L (ref 22–29)
CREATININE: 0.8 mg/dL (ref 0.6–1.1)
Calcium: 9 mg/dL (ref 8.4–10.4)
Chloride: 105 mEq/L (ref 98–109)
GLUCOSE: 99 mg/dL (ref 70–140)
Potassium: 4 mEq/L (ref 3.5–5.1)
SODIUM: 137 meq/L (ref 136–145)
TOTAL PROTEIN: 7.1 g/dL (ref 6.4–8.3)

## 2017-07-02 LAB — RESEARCH LABS

## 2017-07-02 MED ORDER — SODIUM CHLORIDE 0.9 % IV SOLN
Freq: Once | INTRAVENOUS | Status: AC
  Administered 2017-07-02: 15:00:00 via INTRAVENOUS

## 2017-07-02 MED ORDER — ALTEPLASE 2 MG IJ SOLR
2.0000 mg | Freq: Once | INTRAMUSCULAR | Status: AC | PRN
  Administered 2017-07-02: 2 mg
  Filled 2017-07-02: qty 2

## 2017-07-02 MED ORDER — SODIUM CHLORIDE 0.9% FLUSH
10.0000 mL | INTRAVENOUS | Status: DC | PRN
  Administered 2017-07-02: 10 mL
  Filled 2017-07-02: qty 10

## 2017-07-02 MED ORDER — SODIUM CHLORIDE 0.9 % IV SOLN
Freq: Once | INTRAVENOUS | Status: AC
  Administered 2017-07-02: 16:00:00 via INTRAVENOUS
  Filled 2017-07-02: qty 5

## 2017-07-02 MED ORDER — PALONOSETRON HCL INJECTION 0.25 MG/5ML
0.2500 mg | Freq: Once | INTRAVENOUS | Status: AC
Start: 2017-07-02 — End: 2017-07-02
  Administered 2017-07-02: 0.25 mg via INTRAVENOUS

## 2017-07-02 MED ORDER — ALTEPLASE 2 MG IJ SOLR
2.0000 mg | Freq: Once | INTRAMUSCULAR | Status: DC
  Filled 2017-07-02: qty 2

## 2017-07-02 MED ORDER — HEPARIN SOD (PORK) LOCK FLUSH 100 UNIT/ML IV SOLN
500.0000 [IU] | Freq: Once | INTRAVENOUS | Status: AC | PRN
  Administered 2017-07-02: 500 [IU]
  Filled 2017-07-02: qty 5

## 2017-07-02 MED ORDER — PEGFILGRASTIM 6 MG/0.6ML ~~LOC~~ PSKT
PREFILLED_SYRINGE | SUBCUTANEOUS | Status: AC
  Filled 2017-07-02: qty 0.6

## 2017-07-02 MED ORDER — SODIUM CHLORIDE 0.9 % IV SOLN
600.0000 mg/m2 | Freq: Once | INTRAVENOUS | Status: AC
  Administered 2017-07-02: 1180 mg via INTRAVENOUS
  Filled 2017-07-02: qty 59

## 2017-07-02 MED ORDER — SODIUM CHLORIDE 0.9% FLUSH
10.0000 mL | Freq: Once | INTRAVENOUS | Status: AC
  Administered 2017-07-02: 10 mL
  Filled 2017-07-02: qty 10

## 2017-07-02 MED ORDER — PEGFILGRASTIM 6 MG/0.6ML ~~LOC~~ PSKT
6.0000 mg | PREFILLED_SYRINGE | Freq: Once | SUBCUTANEOUS | Status: AC
  Administered 2017-07-02: 6 mg via SUBCUTANEOUS

## 2017-07-02 MED ORDER — DOXORUBICIN HCL CHEMO IV INJECTION 2 MG/ML
60.0000 mg/m2 | Freq: Once | INTRAVENOUS | Status: AC
  Administered 2017-07-02: 118 mg via INTRAVENOUS
  Filled 2017-07-02: qty 59

## 2017-07-02 MED ORDER — ALTEPLASE 2 MG IJ SOLR
INTRAMUSCULAR | Status: AC
  Filled 2017-07-02: qty 2

## 2017-07-02 NOTE — Progress Notes (Signed)
07/02/17 at 11:07am- [facility] 1 month study notes- The pt was into the clinic for her routine treatment appointment with Dr. [name] NP, [place]legm.  The pt's 1 month on-study serum and plasma samples were drawn today.  The samples will be frozen and shipped today by [name], research assistant.  The research nurse met the pt today and discussed the pt's month 3 visit.  The research nurse will meet with the pt in January to coordinate/schedule this month 3 visit   The pt was thanked for her continued support of the trial. Brion Aliment RN, BSN, CCRP Clinical Research Nurse 07/02/2017 11:14 AM

## 2017-07-02 NOTE — Progress Notes (Signed)
Amy Dawson  [phone]) [phone] Fax:([phone]) [phone]     ID: Amy Dawson DOB: [birth year]  MR#: [id number]  AST#:[id number]  Patient Care Team: [name], MD as PCP - General (Family Medicine) [name], [name], MD as Consulting Physician (Oncology) [name], MD as Consulting Physician (General Surgery) [name], MD as Consulting Physician (Radiation Oncology) [name], NP as Nurse Practitioner (Obstetrics and Gynecology) Scot Dock, NP OTHER MD:  CHIEF COMPLAINT: Triple negative breast cancer  CURRENT TREATMENT: Neoadjuvant chemotherapy   HISTORY OF CURRENT ILLNESS: Amy Dawson felt some discomfort in the left breast in June 2018, but did not think much of it.  However in October she palpated a change in the upper outer quadrant of her left breast and she brought this to medical attention.  On May 09, 2017 she had bilateral diagnostic mammography with tomography at the breast center, showing the breast density to be category capital C.  In the left breast that was not obscured mass in the upper outer quadrant, which by palpation was identified at the 1230 o'clock radiant 5 cm from the nipple.  By ultrasound this measured 2.9 cm, and it was irregular and hypoechoic.  The left axilla was sonographically benign.  Biopsy of the left breast mass in question May 14, 2017 showed [id number]) and invasive ductal carcinoma, grade 2 or more likely 3, estrogen and progesterone receptor negative, with no HER-2 amplification, the signals ratio being 1.18 and the number per cell 1.65.  The key 67 was 80%.  The patient's subsequent history is as detailed below.  INTERVAL HISTORY:  Amy Dawson prior to receiving her first cycle of neoadjuvant chemotherapy.  She is starting on Doxorubicin/Cyclophosphamide given on day 1 of a 14 day cycle with Neulasta support.  She is doing well Dawson.  Dawson is cycle 3 day 1.     REVIEW OF SYSTEMS: Amy Dawson. She continues to tolerate chemotherapy well.  She is ready to proceed with treatment.  She denies fevers, chills, nausea, vomiting, constipation, neuropathy or any other concerns.  A detailed ROS was otherwise non contributory.    PAST MEDICAL HISTORY: Past Medical History:  Diagnosis Date  . Allergy   . Anxiety   . Breast cancer ([place])   . Chronic kidney disease    bladder issues  . Depression   . Family history of breast cancer   . Fibromyalgia   . Genetic testing 06/13/2017   Breast/GYN panel (23 genes) @ [facility] - No pathogenic mutations detected  . Headache    smokes marijuana for migraine pain  . Murmur, cardiac    as a child  . Stroke ([place]) 2000   numbness on left side  . TMJ (dislocation of temporomandibular joint)     PAST SURGICAL HISTORY: Past Surgical History:  Procedure Laterality Date  . CESAREAN SECTION     2  . ENDOMETRIAL ABLATION  2011  . PORTACATH PLACEMENT Right 05/31/2017   Procedure: INSERTION PORT-A-CATH WITH [place];  Surgeon: [name], MD;  Location: [facility];  Service: General;  Laterality: Right;    FAMILY HISTORY Family History  Problem Relation Age of Onset  . Liver cancer Sister        "rare type"; deceased 21  . Breast cancer Maternal Aunt        Dx 43s; currently 32s  . Breast cancer Paternal Grandmother        Dx 49s; deceased 66s  . Breast  cancer Other        mat grandfather's sister; dx 39s; deceased 1s  . Cancer Paternal Aunt        unk. type; deceased 39  . Colon cancer Neg Hx   . Esophageal cancer Neg Hx   . Rectal cancer Neg Hx   . Stomach cancer Neg Hx   The patient's parents are in their mid 8s as of November 2018.  The patient had 1 brother, 1 sister.  The sister died from a rare liver cancer at age 19.  A maternal aunt and a paternal grandmother were diagnosed with breast cancer in their 58s.  GYNECOLOGIC HISTORY:  No LMP recorded. Patient has had an ablation. Menarche age 54.   She underwent endometrial ablation 2011.  She is no longer having periods.  She never used hormone replacement.  First live birth was age 59.  She is GX P2.   SOCIAL HISTORY:  Amy Dawson is a Dawson.  Her husband [name] works as an Firefighter for [employer]  Daughter Anwitha Mapes attends [employer].  Son Denys Labree is doing on apprenticeship in the [employer] program.    ADVANCED DIRECTIVES: Not in place   HEALTH MAINTENANCE: Social History   Tobacco Use  . Smoking status: Former Research scientist (life sciences)  . Smokeless tobacco: Never Used  Substance Use Topics  . Alcohol use: Yes    Alcohol/week: 0.0 oz    Comment: sometimes  . Drug use: Yes    Types: Marijuana    Comment: last smoked 05-29-17     Colonoscopy: 2016/[facility]  PAP: October 2018  Bone density: Never   Allergies  Allergen Reactions  . Other Other (See Comments)    Red wine & white cheese: migraine Raw egg whites- hives    Current Outpatient Medications  Medication Sig Dispense Refill  . amitriptyline (ELAVIL) 50 MG tablet Take 25 mg by mouth at bedtime as needed for sleep.    . Aspirin-Acetaminophen-Caffeine (EXCEDRIN PO) Take by mouth.    . Calcium Glycerophosphate (PRELIEF PO) Take 3 tablets by mouth.    . Cetirizine HCl (ZYRTEC ALLERGY PO) Take by mouth daily.    Marland Kitchen dexamethasone (DECADRON) 4 MG tablet Take 2 tablets by mouth once a day on the day after chemotherapy and then take 2 tablets two times a day for 2 days. Take with food. 30 tablet 1  . fluconazole (DIFLUCAN) 150 MG tablet Take 1 tablet by mouth daily.    . fluticasone (FLONASE) 50 MCG/ACT nasal spray Place 2 sprays into both nostrils daily.    . Homeopathic Products (AZO YEAST PLUS PO) Take by mouth.    . lidocaine-prilocaine (EMLA) cream Apply to affected area once 30 g 3  . LORazepam (ATIVAN) 0.5 MG tablet Take 1 tablet (0.5 mg total) at bedtime as needed by mouth (Nausea or vomiting). 30 tablet 0  .  nitrofurantoin, macrocrystal-monohydrate, (MACROBID) 100 MG capsule Take 1 capsule (100 mg total) 2 (two) times daily by mouth. 14 capsule 0  . phenazopyridine (PYRIDIUM) 200 MG tablet Take 200 mg by mouth every 8 (eight) hours as needed.    . Phenazopyridine HCl (AZO-STANDARD PO) Take by mouth.    . Pseudoephedrine HCl (SUDAFED PO) Take by mouth as needed.    . Pumpkin Seed-Soy Germ (AZO BLADDER CONTROL/GO-LESS) CAPS Take by mouth.    . dimenhyDRINATE (DRAMAMINE) 50 MG tablet Take 50 mg every 8 (eight) hours as needed by mouth.    Marland Kitchen  prochlorperazine (COMPAZINE) 10 MG tablet Take 1 tablet (10 mg total) every 6 (six) hours as needed by mouth (Nausea or vomiting). (Patient not taking: Reported on 07/02/2017) 30 tablet 1  . traMADol (ULTRAM) 50 MG tablet Take 2 tablets (100 mg total) every 6 (six) hours as needed by mouth. (Patient not taking: Reported on 07/02/2017) 10 tablet 0   Current Facility-Administered Medications  Medication Dose Route Frequency Provider Last Rate Last Dose  . alteplase (CATHFLO ACTIVASE) injection 2 mg  2 mg Intracatheter Once PRN [name], [name], MD        OBJECTIVE:   Vitals:   07/02/17 1126  BP: (!) 142/87  Pulse: 77  Resp: 18  Temp: 98.2 F (36.8 C)  SpO2: 97%     Body mass index is 26.12 kg/m.   Wt Readings from Last 3 Encounters:  07/02/17 175 lb 9.6 oz (79.7 kg)  06/18/17 178 lb 6.4 oz (80.9 kg)  06/11/17 176 lb 12.8 oz (80.2 kg)     ECOG FS:1 - Symptomatic but completely ambulatory GENERAL: Patient is a well appearing female in no acute distress HEENT:  Sclerae anicteric. PERRL. Oropharynx clear and moist. No ulcerations or evidence of oropharyngeal candidiasis. Neck is supple.  NODES:  No cervical, supraclavicular, or axillary lymphadenopathy palpated.  BREAST EXAM:  Deferred. LUNGS:  Clear to auscultation bilaterally.  No wheezes or rhonchi. HEART:  Regular rate and rhythm. No murmur appreciated. ABDOMEN:  Soft, nontender.  Positive,  normoactive bowel sounds. No organomegaly palpated. MSK:  No focal spinal tenderness to palpation. Full range of motion bilaterally in the upper extremities. EXTREMITIES:  No peripheral edema.   SKIN:  Clear with no obvious rashes or skin changes. No nail dyscrasia. NEURO:  Nonfocal. Well oriented.  Appropriate affect.     LAB RESULTS:  CMP     Component Value Date/Time   NA 139 06/18/2017 1048   K 3.9 06/18/2017 1048   CL 99 05/03/2014 0038   CO2 27 06/18/2017 1048   GLUCOSE 104 06/18/2017 1048   BUN 9.0 06/18/2017 1048   CREATININE 0.7 06/18/2017 1048   CALCIUM 8.7 06/18/2017 1048   PROT 6.7 06/18/2017 1048   ALBUMIN 3.5 06/18/2017 1048   AST 14 06/18/2017 1048   ALT 21 06/18/2017 1048   ALKPHOS 79 06/18/2017 1048   BILITOT <0.22 06/18/2017 1048   GFRNONAA >90 05/03/2014 0038   GFRAA >90 05/03/2014 0038    No results found for: TOTALPROTELP, ALBUMINELP, A1GS, A2GS, BETS, BETA2SER, GAMS, MSPIKE, SPEI  No results found for: Nils Pyle, [facility]  Lab Results  Component Value Date   WBC 5.8 07/02/2017   NEUTROABS 3.9 07/02/2017   HGB 12.5 07/02/2017   HCT 37.5 07/02/2017   MCV 91.8 07/02/2017   PLT 222 07/02/2017      Chemistry      Component Value Date/Time   NA 139 06/18/2017 1048   K 3.9 06/18/2017 1048   CL 99 05/03/2014 0038   CO2 27 06/18/2017 1048   BUN 9.0 06/18/2017 1048   CREATININE 0.7 06/18/2017 1048      Component Value Date/Time   CALCIUM 8.7 06/18/2017 1048   ALKPHOS 79 06/18/2017 1048   AST 14 06/18/2017 1048   ALT 21 06/18/2017 1048   BILITOT <0.22 06/18/2017 1048       No results found for: LABCA2  No components found for: [id number]  No results for input(s): INR in the last 168 hours.  No results found for: LABCA2  No  results found for: CAN199  No results found for: QAS341  No results found for: DQQ229  No results found for: CA2729  No components found for: HGQUANT  No results found for: CEA1 / No  results found for: CEA1   No results found for: AFPTUMOR  No results found for: [place]  No results found for: PSA1  Appointment on 07/02/2017  Component Date Value Ref Range Status  . Research Labs 07/02/2017 See scanned lab reports in Epic/Media.   Final  . WBC 07/02/2017 5.8  3.9 - 10.3 10e3/uL Final  . NEUT# 07/02/2017 3.9  1.5 - 6.5 10e3/uL Final  . HGB 07/02/2017 12.5  11.6 - 15.9 g/dL Final  . HCT 07/02/2017 37.5  34.8 - 46.6 % Final  . Platelets 07/02/2017 222  145 - 400 10e3/uL Final  . MCV 07/02/2017 91.8  79.5 - 101.0 fL Final  . MCH 07/02/2017 30.7  25.1 - 34.0 pg Final  . MCHC 07/02/2017 33.4  31.5 - 36.0 g/dL Final  . RBC 07/02/2017 4.08  3.70 - 5.45 10e6/uL Final  . RDW 07/02/2017 13.7  11.2 - 14.5 % Final  . lymph# 07/02/2017 1.3  0.9 - 3.3 10e3/uL Final  . MONO# 07/02/2017 0.5  0.1 - 0.9 10e3/uL Final  . Eosinophils Absolute 07/02/2017 0.0  0.0 - 0.5 10e3/uL Final  . Basophils Absolute 07/02/2017 0.0  0.0 - 0.1 10e3/uL Final  . NEUT% 07/02/2017 66.9  38.4 - 76.8 % Final  . LYMPH% 07/02/2017 23.0  14.0 - 49.7 % Final  . MONO% 07/02/2017 9.3  0.0 - 14.0 % Final  . EOS% 07/02/2017 0.1  0.0 - 7.0 % Final  . BASO% 07/02/2017 0.7  0.0 - 2.0 % Final    (this displays the last labs from the last 3 days)  No results found for: TOTALPROTELP, ALBUMINELP, A1GS, A2GS, BETS, BETA2SER, GAMS, MSPIKE, SPEI (this displays SPEP labs)  No results found for: KPAFRELGTCHN, LAMBDASER, KAPLAMBRATIO (kappa/lambda light chains)  No results found for: HGBA, HGBA2QUANT, HGBFQUANT, HGBSQUAN (Hemoglobinopathy evaluation)   No results found for: LDH  No results found for: IRON, TIBC, IRONPCTSAT (Iron and TIBC)  No results found for: FERRITIN  Urinalysis    Component Value Date/Time   COLORURINE YELLOW 05/03/2014 0000   APPEARANCEUR CLEAR 05/03/2014 0000   LABSPEC 1.010 05/30/2017 1315   PHURINE Color Interference 05/30/2017 1315   PHURINE 6.0 05/03/2014 0000   GLUCOSEU  Negative 05/30/2017 1315   HGBUR Trace 05/30/2017 1315   HGBUR TRACE (A) 05/03/2014 0000   BILIRUBINUR Color Interference 05/30/2017 1315   KETONESUR Color Interference 05/30/2017 1315   KETONESUR NEGATIVE 05/03/2014 0000   PROTEINUR Color Interference 05/30/2017 1315   PROTEINUR NEGATIVE 05/03/2014 0000   UROBILINOGEN Color Interference 05/30/2017 1315   NITRITE Color Interference 05/30/2017 1315   NITRITE NEGATIVE 05/03/2014 0000   LEUKOCYTESUR Color Interference 05/30/2017 1315     STUDIES: [place] Axilla Left  Result Date: 06/26/2017 CLINICAL DATA:  Second-look ultrasound for abnormal lymph node seen in the left axilla on MRI. Patient has already had 2 rounds of neoadjuvant chemotherapy treatment for treatment of left breast cancer. EXAM: ULTRASOUND OF THE LEFT AXILLA COMPARISON:  MRI of the breast May 29, 2017, mammogram and ultrasound May 09, 2017. FINDINGS: Ultrasound is performed, showing no abnormal lymph node identified. IMPRESSION: Known cancer. RECOMMENDATION: Management on clinical basis. I have discussed the findings and recommendations with the patient. Results were also provided in writing at the conclusion of the visit. If applicable, a  reminder letter will be sent to the patient regarding the next appointment. BI-RADS CATEGORY  6: Known biopsy-proven malignancy. Electronically Signed   By: Abelardo Diesel M.D.   On: 06/26/2017 13:12    ELIGIBLE FOR AVAILABLE RESEARCH PROTOCOL: SWOG 1418 depending on response to neoadjuvant treatment, UPBEAT  ASSESSMENT: 51 y.o. [place] status post left breast upper outer quadrant biopsy May 14, 2017 for a clinical T2 N0, stage IIB invasive ductal carcinoma, grade 3, triple negative, with an MIB-1 of 80%  (1) neoadjuvant chemotherapy will consist of doxorubicin and cyclophosphamide in dose dense fashion x4 starting June 04, 2017, to be followed by weekly carboplatin and paclitaxel x12  (a) echo on 05/30/2017: LVEF 55-60%  (2)  breast conserving surgery with sentinel lymph node sampling to follow  (3) adjuvant radiation to follow surgery  (4) genetics testing pending  PLAN: Amy Dawson is doing well Dawson.  She will proceed with her chemotherapy Dawson with cycle 3 of Doxorubicin and Cyclophosphamide. I reviewed her CBC with her in detail.  CMET is pending.  She will return in 1 week for labs and f/u.    Amy Dawson has a good understanding of the overall plan. She agrees with it. She will call with any problems that may develop before her next visit here.  A total of (20) minutes of face-to-face time was spent with this patient with greater than 50% of that time in counseling and care-coordination.   Scot Dock, NP   07/02/2017 11:36 AM Medical Oncology and Hematology [facility] [address] [place], [place] [address] Tel. [phone]    Fax. [phone]

## 2017-07-02 NOTE — Progress Notes (Signed)
Pt port was flushed several times with no blood return. Desk nurse called. CATHFLOW will be administered by RN. Analeya Luallen LPN

## 2017-07-02 NOTE — Patient Instructions (Signed)
[facility] Discharge Instructions for Patients Receiving Chemotherapy  Today you received the following chemotherapy agents Adriamycin, Cytoxan.  To help prevent nausea and vomiting after your treatment, we encourage you to take your nausea medication as prescribed.   If you develop nausea and vomiting that is not controlled by your nausea medication, call the clinic.   BELOW ARE SYMPTOMS THAT SHOULD BE REPORTED IMMEDIATELY:  *FEVER GREATER THAN 100.5 F  *CHILLS WITH OR WITHOUT FEVER  NAUSEA AND VOMITING THAT IS NOT CONTROLLED WITH YOUR NAUSEA MEDICATION  *UNUSUAL SHORTNESS OF BREATH  *UNUSUAL BRUISING OR BLEEDING  TENDERNESS IN MOUTH AND THROAT WITH OR WITHOUT PRESENCE OF ULCERS  *URINARY PROBLEMS  *BOWEL PROBLEMS  UNUSUAL RASH Items with * indicate a potential emergency and should be followed up as soon as possible.  Feel free to call the clinic should you have any questions or concerns. The clinic phone number is [phone].  Please show the CHEMO ALERT CARD at check-in to the Emergency Department and triage nurse.   

## 2017-07-03 ENCOUNTER — Telehealth: Payer: Self-pay | Admitting: Adult Health

## 2017-07-03 NOTE — Telephone Encounter (Signed)
No 12/18 los. °

## 2017-07-07 ENCOUNTER — Emergency Department (HOSPITAL_COMMUNITY)
Admission: EM | Admit: 2017-07-07 | Discharge: 2017-07-07 | Disposition: A | Discharge status: Home / Self Care | Payer: 59 | Attending: Emergency Medicine | Admitting: Emergency Medicine

## 2017-07-07 ENCOUNTER — Encounter (HOSPITAL_COMMUNITY): Payer: Self-pay | Admitting: Emergency Medicine

## 2017-07-07 DIAGNOSIS — K5900 Constipation, unspecified: Secondary | ICD-10-CM | POA: Diagnosis present

## 2017-07-07 DIAGNOSIS — Z853 Personal history of malignant neoplasm of breast: Secondary | ICD-10-CM | POA: Insufficient documentation

## 2017-07-07 LAB — CBC WITH DIFFERENTIAL/PLATELET
Basophils Absolute: 0 10*3/uL (ref 0.0–0.1)
Basophils Relative: 0 %
EOS ABS: 0 10*3/uL (ref 0.0–0.7)
EOS PCT: 0 %
HCT: 35.9 % — ABNORMAL LOW (ref 36.0–46.0)
Hemoglobin: 12 g/dL (ref 12.0–15.0)
LYMPHS ABS: 1.2 10*3/uL (ref 0.7–4.0)
Lymphocytes Relative: 9 %
MCH: 30.9 pg (ref 26.0–34.0)
MCHC: 33.4 g/dL (ref 30.0–36.0)
MCV: 92.5 fL (ref 78.0–100.0)
MONO ABS: 0.1 10*3/uL (ref 0.1–1.0)
Monocytes Relative: 1 %
NEUTROS PCT: 90 %
Neutro Abs: 12 10*3/uL — ABNORMAL HIGH (ref 1.7–7.7)
PLATELETS: 164 10*3/uL (ref 150–400)
RBC: 3.88 MIL/uL (ref 3.87–5.11)
RDW: 14.5 % (ref 11.5–15.5)
WBC: 13.3 10*3/uL — AB (ref 4.0–10.5)

## 2017-07-07 LAB — URINALYSIS, ROUTINE W REFLEX MICROSCOPIC
Bilirubin Urine: NEGATIVE
Glucose, UA: NEGATIVE mg/dL
Hgb urine dipstick: NEGATIVE
Ketones, ur: NEGATIVE mg/dL
Leukocytes, UA: NEGATIVE
NITRITE: NEGATIVE
PH: 7 (ref 5.0–8.0)
Protein, ur: NEGATIVE mg/dL
SPECIFIC GRAVITY, URINE: 1.003 — AB (ref 1.005–1.030)

## 2017-07-07 LAB — COMPREHENSIVE METABOLIC PANEL
ALT: 13 U/L — AB (ref 14–54)
AST: 17 U/L (ref 15–41)
Albumin: 3.8 g/dL (ref 3.5–5.0)
Alkaline Phosphatase: 103 U/L (ref 38–126)
Anion gap: 6 (ref 5–15)
BUN: 15 mg/dL (ref 6–20)
CHLORIDE: 102 mmol/L (ref 101–111)
CO2: 30 mmol/L (ref 22–32)
Calcium: 8.4 mg/dL — ABNORMAL LOW (ref 8.9–10.3)
Creatinine, Ser: 0.59 mg/dL (ref 0.44–1.00)
Glucose, Bld: 93 mg/dL (ref 65–99)
POTASSIUM: 3.5 mmol/L (ref 3.5–5.1)
SODIUM: 138 mmol/L (ref 135–145)
Total Bilirubin: 0.7 mg/dL (ref 0.3–1.2)
Total Protein: 6.6 g/dL (ref 6.5–8.1)

## 2017-07-07 MED ORDER — FLEET ENEMA 7-19 GM/118ML RE ENEM
1.0000 | ENEMA | Freq: Once | RECTAL | Status: AC
  Administered 2017-07-07: 1 via RECTAL
  Filled 2017-07-07: qty 1

## 2017-07-07 NOTE — Discharge Instructions (Signed)
Please continue to take miralax daily for constipation.   If no good bowel movement in 2-3 days, take it twice daily. If no BM in 2-3 days, take three times daily and then four times daily as needed. It can be continued to be increased, but if you start having loose stools or diarrhea, please cut back down.   Please also return without fail for worsening symptoms, including fever, sever abdominal pain, intractable vomiting or any other symptoms concerning to you.

## 2017-07-07 NOTE — ED Triage Notes (Signed)
Patient reports constipation since Monday last week.  Reports that feels "like I tried giving birth earlier". C/o abd pain

## 2017-07-07 NOTE — ED Provider Notes (Signed)
[facility] DEPT Provider Note   CSN: [id number] Arrival date & time: 07/07/17  1252     History   Chief Complaint Chief Complaint  Patient presents with  . Constipation    HPI Amy Dawson is a 51 y.o. female.  HPI 51 year old female who presents with constipation.  She has a history of breast cancer status post chemotherapy, last about 12 days ago.  Reports that she has been constipated since about 10-11 days ago.  Has not been able to have a bowel movement.  States that she has significant straining without good improvement.  Has been taking MiraLAX daily without change in her symptoms.  Denies any new medications, abdominal pain, nausea or vomiting, fevers or chills.  Does state history of constipation in the past, but denies significant issues with it recently.   Past Medical History:  Diagnosis Date  . Allergy   . Anxiety   . Breast cancer ([place])   . Chronic kidney disease    bladder issues  . Depression   . Family history of breast cancer   . Fibromyalgia   . Genetic testing 06/13/2017   Breast/GYN panel (23 genes) @ [facility] - No pathogenic mutations detected  . Headache    smokes marijuana for migraine pain  . Murmur, cardiac    as a child  . Stroke ([place]) 2000   numbness on left side  . TMJ (dislocation of temporomandibular joint)     Patient Active Problem List   Diagnosis Date Noted  . Port-A-Cath in place 07/02/2017  . Genetic testing 06/13/2017  . Family history of breast cancer   . Fibromyalgia 05/22/2017  . Cerebral hemorrhage ([place]) 05/22/2017  . Malignant neoplasm of upper-outer quadrant of left breast in female, estrogen receptor negative ([place]) 05/21/2017    Past Surgical History:  Procedure Laterality Date  . CESAREAN SECTION     2  . ENDOMETRIAL ABLATION  2011  . PORTACATH PLACEMENT Right 05/31/2017   Procedure: INSERTION PORT-A-CATH WITH [place];  Surgeon: [name], MD;  Location: [facility];  Service: General;  Laterality: Right;    OB History    No data available       Home Medications    Prior to Admission medications   Medication Sig Start Date End Date Taking? Authorizing Provider  amitriptyline (ELAVIL) 50 MG tablet Take 25 mg by mouth at bedtime as needed for sleep.    [provider]  Aspirin-Acetaminophen-Caffeine (EXCEDRIN PO) Take by mouth.    [provider]  Calcium Glycerophosphate (PRELIEF PO) Take 3 tablets by mouth.    [provider]  Cetirizine HCl (ZYRTEC ALLERGY PO) Take by mouth daily.    [provider]  dexamethasone (DECADRON) 4 MG tablet Take 2 tablets by mouth once a day on the day after chemotherapy and then take 2 tablets two times a day for 2 days. Take with food. 05/22/17   [name], [name], MD  dimenhyDRINATE (DRAMAMINE) 50 MG tablet Take 50 mg every 8 (eight) hours as needed by mouth.    [provider]  fluconazole (DIFLUCAN) 150 MG tablet Take 1 tablet by mouth daily. 07/01/17   [provider]  fluticasone (FLONASE) 50 MCG/ACT nasal spray Place 2 sprays into both nostrils daily.    [provider]  Homeopathic Products (AZO YEAST PLUS PO) Take by mouth.    [provider]  lidocaine-prilocaine (EMLA) cream Apply to affected area once 05/22/17  [name], [name], MD  LORazepam (ATIVAN) 0.5 MG tablet Take 1 tablet (0.5 mg total) at bedtime as needed by mouth (Nausea or vomiting). 05/22/17   [name], [name], MD  nitrofurantoin, macrocrystal-monohydrate, (MACROBID) 100 MG capsule Take 1 capsule (100 mg total) 2 (two) times daily by mouth. 06/01/17   [name], [name], MD  phenazopyridine (PYRIDIUM) 200 MG tablet Take 200 mg by mouth every 8 (eight) hours as needed. 07/01/17   [provider]  Phenazopyridine HCl (AZO-STANDARD PO) Take by mouth.    [provider]  prochlorperazine (COMPAZINE) 10 MG tablet Take 1 tablet (10 mg total) every 6  (six) hours as needed by mouth (Nausea or vomiting). Patient not taking: Reported on 07/02/2017 05/22/17   [name], [name], MD  Pseudoephedrine HCl (SUDAFED PO) Take by mouth as needed.    [provider]  Pumpkin Seed-Soy Germ (AZO BLADDER CONTROL/GO-LESS) CAPS Take by mouth.    [provider]  traMADol (ULTRAM) 50 MG tablet Take 2 tablets (100 mg total) every 6 (six) hours as needed by mouth. Patient not taking: Reported on 07/02/2017 05/31/17   [name], MD    Family History Family History  Problem Relation Age of Onset  . Liver cancer Sister        "rare type"; deceased 28  . Breast cancer Maternal Aunt        Dx 83s; currently 26s  . Breast cancer Paternal Grandmother        Dx 65s; deceased 65s  . Breast cancer Other        mat grandfather's sister; dx 45s; deceased 55s  . Cancer Paternal Aunt        unk. type; deceased 59  . Colon cancer Neg Hx   . Esophageal cancer Neg Hx   . Rectal cancer Neg Hx   . Stomach cancer Neg Hx     Social History Social History   Tobacco Use  . Smoking status: Former Research scientist (life sciences)  . Smokeless tobacco: Never Used  Substance Use Topics  . Alcohol use: Yes    Alcohol/week: 0.0 oz    Comment: sometimes  . Drug use: Yes    Types: Marijuana    Comment: last smoked 05-29-17     Allergies   Other   Review of Systems Review of Systems  Constitutional: Negative for fever.  Respiratory: Negative for shortness of breath.   Cardiovascular: Negative for chest pain.  Gastrointestinal: Positive for constipation. Negative for abdominal pain.  Genitourinary: Negative for dysuria and frequency.  All other systems reviewed and are negative.    Physical Exam Updated Vital Signs BP 128/88 (BP Location: Right Arm)   Pulse 85   Temp (!) 97.4 F (36.3 C) (Oral)   Resp 14   SpO2 97%   Physical Exam Physical Exam  Nursing note and vitals reviewed. Constitutional: Chronically ill appearing, non-toxic, and in no  acute distress Head: Normocephalic and atraumatic.  Mouth/Throat: Oropharynx is clear and moist.  Neck: Normal range of motion. Neck supple.  Cardiovascular: Normal rate and regular rhythm.   Pulmonary/Chest: Effort normal and breath sounds normal.  Abdominal: Soft. There is no tenderness. There is no rebound and no guarding. Scant stool in the rectum. No significant impaction of stool.  Musculoskeletal: Normal range of motion.  Neurological: Alert, no facial droop, fluent speech, moves all extremities symmetrically Skin: Skin is warm and dry.  Psychiatric: Cooperative   ED Treatments / Results  Labs (all labs ordered are listed, but  only abnormal results are displayed) Labs Reviewed  CBC WITH DIFFERENTIAL/PLATELET - Abnormal; Notable for the following components:      Result Value   WBC 13.3 (*)    HCT 35.9 (*)    Neutro Abs 12.0 (*)    All other components within normal limits  COMPREHENSIVE METABOLIC PANEL - Abnormal; Notable for the following components:   Calcium 8.4 (*)    ALT 13 (*)    All other components within normal limits  URINALYSIS, ROUTINE W REFLEX MICROSCOPIC - Abnormal; Notable for the following components:   Specific Gravity, Urine 1.003 (*)    All other components within normal limits    EKG  EKG Interpretation None       Radiology No results found.  Procedures Procedures (including critical care time)  Medications Ordered in ED Medications  sodium phosphate (FLEET) 7-19 GM/118ML enema 1 enema (1 enema Rectal Given 07/07/17 1405)     Initial Impression / Assessment and Plan / ED Course  I have reviewed the triage vital signs and the nursing notes.  Pertinent labs & imaging results that were available during my care of the patient were reviewed by me and considered in my medical decision making (see chart for details).     51 year old female with history of breast cancer status post chemotherapy who presents with constipation.  No  significant fecal impaction noted on rectal exam.  She has a soft and nontender abdomen.  No other GI symptoms.  At this time I doubt serious intra-abdominal processes such as obstruction or infection.  She is given a Fleet enema, and on follow-up was able to have a very large bowel movement.  States that she feels significantly better.  Repeat exam still shows soft and benign abdomen.  UA unremarkable.  Blood work overall reassuring with minimal leukocytosis.  Given her benign exam I do feel that she is safe for discharge home.  Will continue to use MiraLAX at home. Strict return and follow-up instructions reviewed. She expressed understanding of all discharge instructions and felt comfortable with the plan of care.   Final Clinical Impressions(s) / ED Diagnoses   Final diagnoses:  Constipation, unspecified constipation type    ED Discharge Orders    None       Forde Dandy, MD 07/07/17 1521

## 2017-07-10 ENCOUNTER — Telehealth: Payer: Self-pay | Admitting: *Deleted

## 2017-07-10 ENCOUNTER — Telehealth: Payer: Self-pay | Admitting: Adult Health

## 2017-07-10 ENCOUNTER — Other Ambulatory Visit (HOSPITAL_BASED_OUTPATIENT_CLINIC_OR_DEPARTMENT_OTHER): Payer: 59

## 2017-07-10 ENCOUNTER — Ambulatory Visit: Payer: 59 | Admitting: Adult Health

## 2017-07-10 DIAGNOSIS — C50412 Malignant neoplasm of upper-outer quadrant of left female breast: Secondary | ICD-10-CM | POA: Diagnosis not present

## 2017-07-10 DIAGNOSIS — Z171 Estrogen receptor negative status [ER-]: Principal | ICD-10-CM

## 2017-07-10 LAB — CBC WITH DIFFERENTIAL/PLATELET
BASO%: 2.4 % — ABNORMAL HIGH (ref 0.0–2.0)
Basophils Absolute: 0.1 10*3/uL (ref 0.0–0.1)
EOS ABS: 0 10*3/uL (ref 0.0–0.5)
EOS%: 0.5 % (ref 0.0–7.0)
HEMATOCRIT: 37.7 % (ref 34.8–46.6)
HEMOGLOBIN: 12.7 g/dL (ref 11.6–15.9)
LYMPH#: 1.1 10*3/uL (ref 0.9–3.3)
LYMPH%: 51.2 % — AB (ref 14.0–49.7)
MCH: 31.1 pg (ref 25.1–34.0)
MCHC: 33.7 g/dL (ref 31.5–36.0)
MCV: 92.2 fL (ref 79.5–101.0)
MONO#: 0.4 10*3/uL (ref 0.1–0.9)
MONO%: 17.5 % — ABNORMAL HIGH (ref 0.0–14.0)
NEUT%: 28.4 % — ABNORMAL LOW (ref 38.4–76.8)
NEUTROS ABS: 0.6 10*3/uL — AB (ref 1.5–6.5)
PLATELETS: 189 10*3/uL (ref 145–400)
RBC: 4.09 10*6/uL (ref 3.70–5.45)
RDW: 13.8 % (ref 11.2–14.5)
WBC: 2.1 10*3/uL — AB (ref 3.9–10.3)

## 2017-07-10 LAB — COMPREHENSIVE METABOLIC PANEL
ALBUMIN: 3.8 g/dL (ref 3.5–5.0)
ALK PHOS: 92 U/L (ref 40–150)
ALT: 11 U/L (ref 0–55)
ANION GAP: 10 meq/L (ref 3–11)
AST: 10 U/L (ref 5–34)
BUN: 7.4 mg/dL (ref 7.0–26.0)
CALCIUM: 8.9 mg/dL (ref 8.4–10.4)
CO2: 25 mEq/L (ref 22–29)
CREATININE: 0.7 mg/dL (ref 0.6–1.1)
Chloride: 102 mEq/L (ref 98–109)
Glucose: 95 mg/dl (ref 70–140)
Potassium: 3.7 mEq/L (ref 3.5–5.1)
Sodium: 138 mEq/L (ref 136–145)
TOTAL PROTEIN: 7.1 g/dL (ref 6.4–8.3)

## 2017-07-10 NOTE — Telephone Encounter (Signed)
No 12/26 los.  

## 2017-07-10 NOTE — Telephone Encounter (Signed)
This RN was informed by lab employee at 1700 that pt was sitting in lobby " waiting to be seen ". Noted appointment for today was scheduled at 11 am for lab and 1130 with NP.  Noted appointment was rescheduled on 06/07/2017 with out a note stating patient contacted.  This RN noted labs at [facility] for day 8 cycle 3 for adria/CTX.  Per inquiry with pt she stated she has on her appointment calender visit for today at 3pm.  This RN gave apology due to schedule change and inquired if when she checked in if anyone informed her she was checking in 4 hours past her scheduled appointment.  She stated " no ".  Per discussion pt denies any fevers or chills, she was seen in the ER on 12/24 due to severe constipation with elevation post enema.  She states she feels " blah " but not anything specific.  She denies any need for the office to call in refills for her medications.  Pt left at this time with understanding that this RN would call her post having labs reviewed by a provider.  Labs were reviewed by on call MD - with no additional recommendations.  This RN entered a SZP per above incident.

## 2017-07-12 ENCOUNTER — Telehealth (HOSPITAL_COMMUNITY): Payer: Self-pay | Admitting: Vascular Surgery

## 2017-07-12 NOTE — Telephone Encounter (Signed)
Left pt message to make NP echo/brst appt

## 2017-07-13 ENCOUNTER — Other Ambulatory Visit: Payer: Self-pay | Admitting: Oncology

## 2017-07-13 DIAGNOSIS — Z171 Estrogen receptor negative status [ER-]: Principal | ICD-10-CM

## 2017-07-13 DIAGNOSIS — C50412 Malignant neoplasm of upper-outer quadrant of left female breast: Secondary | ICD-10-CM

## 2017-07-16 DIAGNOSIS — N189 Chronic kidney disease, unspecified: Secondary | ICD-10-CM

## 2017-07-16 HISTORY — DX: Chronic kidney disease, unspecified: N18.9

## 2017-07-17 ENCOUNTER — Telehealth: Payer: Self-pay | Admitting: Oncology

## 2017-07-17 ENCOUNTER — Ambulatory Visit (HOSPITAL_BASED_OUTPATIENT_CLINIC_OR_DEPARTMENT_OTHER): Payer: 59

## 2017-07-17 ENCOUNTER — Ambulatory Visit: Payer: 59

## 2017-07-17 ENCOUNTER — Other Ambulatory Visit (HOSPITAL_BASED_OUTPATIENT_CLINIC_OR_DEPARTMENT_OTHER): Payer: 59

## 2017-07-17 ENCOUNTER — Ambulatory Visit (HOSPITAL_BASED_OUTPATIENT_CLINIC_OR_DEPARTMENT_OTHER): Payer: 59 | Admitting: Oncology

## 2017-07-17 VITALS — BP 112/66 | HR 86 | Temp 98.7°F | Resp 18 | Ht 68.75 in | Wt 175.9 lb

## 2017-07-17 DIAGNOSIS — Z171 Estrogen receptor negative status [ER-]: Secondary | ICD-10-CM | POA: Diagnosis not present

## 2017-07-17 DIAGNOSIS — C50412 Malignant neoplasm of upper-outer quadrant of left female breast: Secondary | ICD-10-CM

## 2017-07-17 DIAGNOSIS — Z5189 Encounter for other specified aftercare: Secondary | ICD-10-CM

## 2017-07-17 DIAGNOSIS — Z95828 Presence of other vascular implants and grafts: Secondary | ICD-10-CM

## 2017-07-17 DIAGNOSIS — I619 Nontraumatic intracerebral hemorrhage, unspecified: Secondary | ICD-10-CM

## 2017-07-17 DIAGNOSIS — Z5111 Encounter for antineoplastic chemotherapy: Secondary | ICD-10-CM

## 2017-07-17 LAB — CBC WITH DIFFERENTIAL/PLATELET
BASO%: 0.6 % (ref 0.0–2.0)
Basophils Absolute: 0 10*3/uL (ref 0.0–0.1)
EOS%: 0.1 % (ref 0.0–7.0)
Eosinophils Absolute: 0 10*3/uL (ref 0.0–0.5)
HEMATOCRIT: 36 % (ref 34.8–46.6)
HEMOGLOBIN: 11.9 g/dL (ref 11.6–15.9)
LYMPH#: 1.1 10*3/uL (ref 0.9–3.3)
LYMPH%: 20.4 % (ref 14.0–49.7)
MCH: 30.7 pg (ref 25.1–34.0)
MCHC: 33.1 g/dL (ref 31.5–36.0)
MCV: 92.7 fL (ref 79.5–101.0)
MONO#: 0.5 10*3/uL (ref 0.1–0.9)
MONO%: 9.6 % (ref 0.0–14.0)
NEUT%: 69.3 % (ref 38.4–76.8)
NEUTROS ABS: 3.7 10*3/uL (ref 1.5–6.5)
PLATELETS: 216 10*3/uL (ref 145–400)
RBC: 3.89 10*6/uL (ref 3.70–5.45)
RDW: 14.8 % — AB (ref 11.2–14.5)
WBC: 5.3 10*3/uL (ref 3.9–10.3)

## 2017-07-17 MED ORDER — SODIUM CHLORIDE 0.9% FLUSH
10.0000 mL | INTRAVENOUS | Status: DC | PRN
Start: 2017-07-17 — End: 2017-07-17
  Administered 2017-07-17: 10 mL
  Filled 2017-07-17: qty 10

## 2017-07-17 MED ORDER — PALONOSETRON HCL INJECTION 0.25 MG/5ML
0.2500 mg | Freq: Once | INTRAVENOUS | Status: AC
  Administered 2017-07-17: 0.25 mg via INTRAVENOUS

## 2017-07-17 MED ORDER — SODIUM CHLORIDE 0.9% FLUSH
10.0000 mL | Freq: Once | INTRAVENOUS | Status: AC
  Administered 2017-07-17: 10 mL
  Filled 2017-07-17: qty 10

## 2017-07-17 MED ORDER — PEGFILGRASTIM INJECTION 6 MG/0.6ML ~~LOC~~: 6.0000 mg | PREFILLED_SYRINGE | Freq: Once | SUBCUTANEOUS | Status: DC

## 2017-07-17 MED ORDER — PALONOSETRON HCL INJECTION 0.25 MG/5ML
INTRAVENOUS | Status: AC
  Filled 2017-07-17: qty 5

## 2017-07-17 MED ORDER — HEPARIN SOD (PORK) LOCK FLUSH 100 UNIT/ML IV SOLN
500.0000 [IU] | Freq: Once | INTRAVENOUS | Status: AC | PRN
  Administered 2017-07-17: 500 [IU]
  Filled 2017-07-17: qty 5

## 2017-07-17 MED ORDER — SODIUM CHLORIDE 0.9 % IV SOLN
600.0000 mg/m2 | Freq: Once | INTRAVENOUS | Status: AC
  Administered 2017-07-17: 1180 mg via INTRAVENOUS
  Filled 2017-07-17: qty 59

## 2017-07-17 MED ORDER — SODIUM CHLORIDE 0.9 % IV SOLN
Freq: Once | INTRAVENOUS | Status: AC
  Administered 2017-07-17: 14:00:00 via INTRAVENOUS

## 2017-07-17 MED ORDER — DOXORUBICIN HCL CHEMO IV INJECTION 2 MG/ML
60.0000 mg/m2 | Freq: Once | INTRAVENOUS | Status: AC
  Administered 2017-07-17: 118 mg via INTRAVENOUS
  Filled 2017-07-17: qty 59

## 2017-07-17 MED ORDER — SODIUM CHLORIDE 0.9 % IV SOLN
Freq: Once | INTRAVENOUS | Status: AC
  Administered 2017-07-17: 15:00:00 via INTRAVENOUS
  Filled 2017-07-17: qty 5

## 2017-07-17 MED ORDER — PEGFILGRASTIM 6 MG/0.6ML ~~LOC~~ PSKT
PREFILLED_SYRINGE | SUBCUTANEOUS | Status: AC
  Filled 2017-07-17: qty 0.6

## 2017-07-17 MED ORDER — PEGFILGRASTIM 6 MG/0.6ML ~~LOC~~ PSKT
6.0000 mg | PREFILLED_SYRINGE | Freq: Once | SUBCUTANEOUS | Status: AC
  Administered 2017-07-17: 6 mg via SUBCUTANEOUS

## 2017-07-17 NOTE — Telephone Encounter (Signed)
Gave patient AVs and calendar of upcoming January through April appointments.

## 2017-07-17 NOTE — Progress Notes (Signed)
[place]  [phone] [phone]     ID: Amy Dawson DOB: Sep 12, 1965  MR#: [id number]  LTJ#:[id number]  Patient Care Team: [name], MD as PCP - General (Family Medicine) , [name], MD as Consulting Physician (Oncology) [name], MD as Consulting Physician (General Surgery) [name], MD as Consulting Physician (Radiation Oncology) [name], NP as Nurse Practitioner (Obstetrics and Gynecology) Bobetta Lime, MD OTHER MD:  CHIEF COMPLAINT: Triple negative breast cancer  CURRENT TREATMENT: Neoadjuvant chemotherapy   HISTORY OF CURRENT ILLNESS: From the original intake note:  Amy Dawson felt some discomfort in the left breast in June 2018, but did not think much of it.  However in October she palpated a change in the upper outer quadrant of her left breast and she brought this to medical attention.  On May 09, 2017 she had bilateral diagnostic mammography with tomography at the breast center, showing the breast density to be category capital C.  In the left breast that was not obscured mass in the upper outer quadrant, which by palpation was identified at the 1230 o'clock radiant 5 cm from the nipple.  By ultrasound this measured 2.9 cm, and it was irregular and hypoechoic.  The left axilla was sonographically benign.  Biopsy of the left breast mass in question May 14, 2017 showed [phone]) and invasive ductal carcinoma, grade 2 or more likely 3, estrogen and progesterone receptor negative, with no HER-2 amplification, the signals ratio being 1.18 and the number per cell 1.65.  The key 67 was 80%.  The patient's subsequent history is as detailed below.  INTERVAL HISTORY: Amy Dawson returns today for follow up and treatment of her triple negative breast cancer. Today she receives day 1 cycle 4 of 4 planned cycles of Doxorubicin/Cyclophosphamide given with Neulasta support. She notes that as long as she takes her  supportive medications on time, then she is fine. Otherwise, she notes that she gets nauseous if she forgets. She denies vomiting. She reports that she feels tired the 1st week after treatment, but she is better the following week. She denies having mouth sores.   REVIEW OF SYSTEMS: Amy Dawson reports that she's been having issues with constipation that caused her to have an enema in the hospital. As a result of this, she is drinking 100 oz of water per day and takes Miralax every couple of days. She reports that she had hot flashes prior to having chemo, but they have increased. She thinks this is due her decrease in exercise. She notes that she walked about 2 miles per day and completed weight lifting exercises, which seemed to help decrease the hot flashes. She denies unusual headaches, visual changes, nausea, vomiting, or dizziness. There has been no unusual cough, phlegm production, or pleurisy. This been no change in bowel or bladder habits. She denies unexplained fatigue or unexplained weight loss, bleeding, rash, or fever. A detailed review of systems was otherwise stable.    PAST MEDICAL HISTORY: Past Medical History:  Diagnosis Date  . Allergy   . Anxiety   . Breast cancer ([place])   . Chronic kidney disease    bladder issues  . Depression   . Family history of breast cancer   . Fibromyalgia   . Genetic testing 06/13/2017   Breast/GYN panel (23 genes) @ [facility] - No pathogenic mutations detected  . Headache    smokes marijuana for migraine pain  . Murmur, cardiac    as a child  . Stroke [facility])  2000   numbness on left side  . TMJ (dislocation of temporomandibular joint)     PAST SURGICAL HISTORY: Past Surgical History:  Procedure Laterality Date  . CESAREAN SECTION     2  . ENDOMETRIAL ABLATION  2011  . PORTACATH PLACEMENT Right 05/31/2017   Procedure: INSERTION PORT-A-CATH WITH [place];  Surgeon: [name], MD;  Location: [facility];  Service: General;   Laterality: Right;    FAMILY HISTORY Family History  Problem Relation Age of Onset  . Liver cancer Sister        "rare type"; deceased 11  . Breast cancer Maternal Aunt        Dx 46s; currently 80s  . Breast cancer Paternal Grandmother        Dx 58s; deceased 49s  . Breast cancer Other        mat grandfather's sister; dx 48s; deceased 18s  . Cancer Paternal Aunt        unk. type; deceased 55  . Colon cancer Neg Hx   . Esophageal cancer Neg Hx   . Rectal cancer Neg Hx   . Stomach cancer Neg Hx   The patient's parents are in their mid 64s as of November 2018.  The patient had 1 brother, 1 sister.  The sister died from a rare liver cancer at age 104.  A maternal aunt and a paternal grandmother were diagnosed with breast cancer in their 25s.  GYNECOLOGIC HISTORY:  No LMP recorded. Patient has had an ablation. Menarche age 70.  She underwent endometrial ablation 2011.  She is no longer having periods.  She never used hormone replacement.  First live birth was age 69.  She is GX P2.   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  Her husband [name] works as an Firefighter for [employer]  Daughter Madgie Dhaliwal attends [employer].  Son Latisha Lasch is doing on apprenticeship in the [employer] program.    ADVANCED DIRECTIVES: Not in place   HEALTH MAINTENANCE: Social History   Tobacco Use  . Smoking status: Former Research scientist (life sciences)  . Smokeless tobacco: Never Used  Substance Use Topics  . Alcohol use: Yes    Alcohol/week: 0.0 oz    Comment: sometimes  . Drug use: Yes    Types: Marijuana    Comment: last smoked 05-29-17     Colonoscopy: 2016/[facility]  PAP: October 2018  Bone density: Never   Allergies  Allergen Reactions  . Other Other (See Comments)    Red wine & white cheese: migraine Raw egg whites- hives    Current Outpatient Medications  Medication Sig Dispense Refill  . amitriptyline (ELAVIL) 50 MG tablet Take 25 mg by  mouth at bedtime as needed for sleep.    . Aspirin-Acetaminophen-Caffeine (EXCEDRIN PO) Take by mouth.    . Calcium Glycerophosphate (PRELIEF PO) Take 3 tablets by mouth.    . Cetirizine HCl (ZYRTEC ALLERGY PO) Take by mouth daily.    Marland Kitchen dexamethasone (DECADRON) 4 MG tablet Take 2 tablets by mouth once a day on the day after chemotherapy and then take 2 tablets two times a day for 2 days. Take with food. 30 tablet 1  . dimenhyDRINATE (DRAMAMINE) 50 MG tablet Take 50 mg every 8 (eight) hours as needed by mouth.    . fluconazole (DIFLUCAN) 150 MG tablet Take 1 tablet by mouth daily.    . fluticasone (FLONASE) 50 MCG/ACT nasal spray Place 2 sprays  into both nostrils daily.    . Homeopathic Products (AZO YEAST PLUS PO) Take by mouth.    . lidocaine-prilocaine (EMLA) cream Apply to affected area once 30 g 3  . LORazepam (ATIVAN) 0.5 MG tablet TAKE 1 TABLET BY MOUTH AT BEDTIME AS NEEDED (NAUSEA/VOMITING) 30 tablet 0  . nitrofurantoin, macrocrystal-monohydrate, (MACROBID) 100 MG capsule Take 1 capsule (100 mg total) 2 (two) times daily by mouth. 14 capsule 0  . phenazopyridine (PYRIDIUM) 200 MG tablet Take 200 mg by mouth every 8 (eight) hours as needed.    . Phenazopyridine HCl (AZO-STANDARD PO) Take by mouth.    . prochlorperazine (COMPAZINE) 10 MG tablet Take 1 tablet (10 mg total) every 6 (six) hours as needed by mouth (Nausea or vomiting). (Patient not taking: Reported on 07/02/2017) 30 tablet 1  . Pseudoephedrine HCl (SUDAFED PO) Take by mouth as needed.    . Pumpkin Seed-Soy Germ (AZO BLADDER CONTROL/GO-LESS) CAPS Take by mouth.    . traMADol (ULTRAM) 50 MG tablet Take 2 tablets (100 mg total) every 6 (six) hours as needed by mouth. (Patient not taking: Reported on 07/02/2017) 10 tablet 0   No current facility-administered medications for this visit.     OBJECTIVE: Middle-aged white woman who appears stated age  52:   07/17/17 1215  BP: 112/66  Pulse: 86  Resp: 18  Temp: 98.7 F (37.1  C)  SpO2: 99%     Body mass index is 26.17 kg/m.   Wt Readings from Last 3 Encounters:  07/17/17 175 lb 14.4 oz (79.8 kg)  07/02/17 175 lb 9.6 oz (79.7 kg)  06/18/17 178 lb 6.4 oz (80.9 kg)     ECOG FS:1 - Symptomatic but completely ambulatory  Sclerae unicteric, EOMs intact Oropharynx clear and moist No cervical or supraclavicular adenopathy Lungs no rales or rhonchi Heart regular rate and rhythm Abd soft, nontender, positive bowel sounds MSK no focal spinal tenderness, no upper extremity lymphedema Neuro: nonfocal, well oriented, appropriate affect Breasts: The right breast is unremarkable.  The mass in the upper portion of the left breast is still easily palpable, although it appears smaller.  It is not appreciably softer.  There is no associated erythema.  Both axillae are benign.      LAB RESULTS:  CMP     Component Value Date/Time   NA 138 07/10/2017 1458   K 3.7 07/10/2017 1458   CL 102 07/07/2017 1400   CO2 25 07/10/2017 1458   GLUCOSE 95 07/10/2017 1458   BUN 7.4 07/10/2017 1458   CREATININE 0.7 07/10/2017 1458   CALCIUM 8.9 07/10/2017 1458   PROT 7.1 07/10/2017 1458   ALBUMIN 3.8 07/10/2017 1458   AST 10 07/10/2017 1458   ALT 11 07/10/2017 1458   ALKPHOS 92 07/10/2017 1458   BILITOT <0.22 07/10/2017 1458   GFRNONAA >60 07/07/2017 1400   GFRAA >60 07/07/2017 1400    No results found for: TOTALPROTELP, ALBUMINELP, A1GS, A2GS, BETS, BETA2SER, GAMS, MSPIKE, SPEI  No results found for: KPAFRELGTCHN, LAMBDASER, [facility]  Lab Results  Component Value Date   WBC 5.3 07/17/2017   NEUTROABS 3.7 07/17/2017   HGB 11.9 07/17/2017   HCT 36.0 07/17/2017   MCV 92.7 07/17/2017   PLT 216 07/17/2017      Chemistry      Component Value Date/Time   NA 138 07/10/2017 1458   K 3.7 07/10/2017 1458   CL 102 07/07/2017 1400   CO2 25 07/10/2017 1458   BUN 7.4 07/10/2017 1458  CREATININE 0.7 07/10/2017 1458      Component Value Date/Time   CALCIUM 8.9  07/10/2017 1458   ALKPHOS 92 07/10/2017 1458   AST 10 07/10/2017 1458   ALT 11 07/10/2017 1458   BILITOT <0.22 07/10/2017 1458       No results found for: LABCA2  No components found for: [id number]  No results for input(s): INR in the last 168 hours.  No results found for: LABCA2  No results found for: XTK240  No results found for: XBD532  No results found for: DJM426  No results found for: CA2729  No components found for: HGQUANT  No results found for: CEA1 / No results found for: CEA1   No results found for: AFPTUMOR  No results found for: [place]  No results found for: PSA1  Appointment on 07/17/2017  Component Date Value Ref Range Status  . WBC 07/17/2017 5.3  3.9 - 10.3 10e3/uL Final  . NEUT# 07/17/2017 3.7  1.5 - 6.5 10e3/uL Final  . HGB 07/17/2017 11.9  11.6 - 15.9 g/dL Final  . HCT 07/17/2017 36.0  34.8 - 46.6 % Final  . Platelets 07/17/2017 216  145 - 400 10e3/uL Final  . MCV 07/17/2017 92.7  79.5 - 101.0 fL Final  . MCH 07/17/2017 30.7  25.1 - 34.0 pg Final  . MCHC 07/17/2017 33.1  31.5 - 36.0 g/dL Final  . RBC 07/17/2017 3.89  3.70 - 5.45 10e6/uL Final  . RDW 07/17/2017 14.8* 11.2 - 14.5 % Final  . lymph# 07/17/2017 1.1  0.9 - 3.3 10e3/uL Final  . MONO# 07/17/2017 0.5  0.1 - 0.9 10e3/uL Final  . Eosinophils Absolute 07/17/2017 0.0  0.0 - 0.5 10e3/uL Final  . Basophils Absolute 07/17/2017 0.0  0.0 - 0.1 10e3/uL Final  . NEUT% 07/17/2017 69.3  38.4 - 76.8 % Final  . LYMPH% 07/17/2017 20.4  14.0 - 49.7 % Final  . MONO% 07/17/2017 9.6  0.0 - 14.0 % Final  . EOS% 07/17/2017 0.1  0.0 - 7.0 % Final  . BASO% 07/17/2017 0.6  0.0 - 2.0 % Final    (this displays the last labs from the last 3 days)  No results found for: TOTALPROTELP, ALBUMINELP, A1GS, A2GS, BETS, BETA2SER, GAMS, MSPIKE, SPEI (this displays SPEP labs)  No results found for: KPAFRELGTCHN, LAMBDASER, KAPLAMBRATIO (kappa/lambda light chains)  No results found for: HGBA, HGBA2QUANT,  HGBFQUANT, HGBSQUAN (Hemoglobinopathy evaluation)   No results found for: LDH  No results found for: IRON, TIBC, IRONPCTSAT (Iron and TIBC)  No results found for: FERRITIN  Urinalysis    Component Value Date/Time   COLORURINE YELLOW 07/07/2017 1400   APPEARANCEUR CLEAR 07/07/2017 1400   LABSPEC 1.003 (L) 07/07/2017 1400   LABSPEC 1.010 05/30/2017 1315   PHURINE 7.0 07/07/2017 1400   GLUCOSEU NEGATIVE 07/07/2017 1400   GLUCOSEU Negative 05/30/2017 1315   HGBUR NEGATIVE 07/07/2017 1400   BILIRUBINUR NEGATIVE 07/07/2017 1400   BILIRUBINUR Color Interference 05/30/2017 1315   KETONESUR NEGATIVE 07/07/2017 1400   PROTEINUR NEGATIVE 07/07/2017 1400   UROBILINOGEN Color Interference 05/30/2017 1315   NITRITE NEGATIVE 07/07/2017 1400   LEUKOCYTESUR NEGATIVE 07/07/2017 1400   LEUKOCYTESUR Color Interference 05/30/2017 1315     STUDIES: [place] Axilla Left  Result Date: 06/26/2017 CLINICAL DATA:  Second-look ultrasound for abnormal lymph node seen in the left axilla on MRI. Patient has already had 2 rounds of neoadjuvant chemotherapy treatment for treatment of left breast cancer. EXAM: ULTRASOUND OF THE LEFT AXILLA COMPARISON:  MRI of the  breast May 29, 2017, mammogram and ultrasound May 09, 2017. FINDINGS: Ultrasound is performed, showing no abnormal lymph node identified. IMPRESSION: Known cancer. RECOMMENDATION: Management on clinical basis. I have discussed the findings and recommendations with the patient. Results were also provided in writing at the conclusion of the visit. If applicable, a reminder letter will be sent to the patient regarding the next appointment. BI-RADS CATEGORY  6: Known biopsy-proven malignancy. Electronically Signed   By: Abelardo Diesel M.D.   On: 06/26/2017 13:12    ELIGIBLE FOR AVAILABLE RESEARCH PROTOCOL: SWOG 1418 depending on response to neoadjuvant treatment, UPBEAT  ASSESSMENT: 51 y.o. [place] status post left breast upper outer quadrant biopsy  May 14, 2017 for a clinical T2 N0, stage IIB invasive ductal carcinoma, grade 3, triple negative, with an MIB-1 of 80%  (1) neoadjuvant chemotherapy will consist of doxorubicin and cyclophosphamide in dose dense fashion x4 starting June 04, 2017, to be followed by weekly carboplatin and paclitaxel x12  (a) echo on 05/30/2017: LVEF 55-60%  (2) breast conserving surgery with sentinel lymph node sampling to follow  (3) adjuvant radiation to follow surgery  (4) genetics testing pending  PLAN: Quynh will complete the first portion of her chemotherapy today.  She has generally tolerated it well.  We frequently have a much more marked response to the chemo than we have had so far, since her mass is still easily palpable and is still deforms the breast contour.  It is however somewhat smaller.  In any case we will be switching to carboplatin and paclitaxel beginning 07/30/2017.  Today we began to discuss the possible toxicity side effects and complications of these agents, but we will do that in much more detail when she returns to see me a week from now.  She knows to call for any other issues that may develop before the next visit.   Jameca Chumley, [name], MD  07/17/17 12:28 PM Medical Oncology and Hematology [facility] [address] [place], [place] [address] Tel. [phone]    Fax. [phone]  This document serves as a record of services personally performed by Lurline Del, MD. It was created on his behalf by Sheron Nightingale, a trained medical scribe. The creation of this record is based on the scribe's personal observations and the provider's statements to them.   I have reviewed the above documentation for accuracy and completeness, and I agree with the above.

## 2017-07-17 NOTE — Patient Instructions (Signed)
Cancer Center Discharge Instructions for Patients Receiving Chemotherapy  Today you received the following chemotherapy agents:  Cytoxan and Adriamycin.  To help prevent nausea and vomiting after your treatment, we encourage you to take your nausea medication as directed.   If you develop nausea and vomiting that is not controlled by your nausea medication, call the clinic.   BELOW ARE SYMPTOMS THAT SHOULD BE REPORTED IMMEDIATELY:  *FEVER GREATER THAN 100.5 F  *CHILLS WITH OR WITHOUT FEVER  NAUSEA AND VOMITING THAT IS NOT CONTROLLED WITH YOUR NAUSEA MEDICATION  *UNUSUAL SHORTNESS OF BREATH  *UNUSUAL BRUISING OR BLEEDING  TENDERNESS IN MOUTH AND THROAT WITH OR WITHOUT PRESENCE OF ULCERS  *URINARY PROBLEMS  *BOWEL PROBLEMS  UNUSUAL RASH Items with * indicate a potential emergency and should be followed up as soon as possible.  Feel free to call the clinic should you have any questions or concerns. The clinic phone number is [phone].  Please show the CHEMO ALERT CARD at check-in to the Emergency Department and triage nurse.      

## 2017-07-18 ENCOUNTER — Telehealth (HOSPITAL_COMMUNITY): Payer: Self-pay | Admitting: Vascular Surgery

## 2017-07-18 NOTE — Telephone Encounter (Signed)
Left pt second message to make NEW BRST appt w/ echo in Feb

## 2017-07-23 NOTE — Progress Notes (Signed)
[facility]  [phone] [phone]     ID: Amy Dawson DOB: [birth year]  MR#: [id number]  KDX#:[id number]  Patient Care Team: [name], MD as PCP - General (Family Medicine) [name], [name], MD as Consulting Physician (Oncology) [name], MD as Consulting Physician (General Surgery) [name], MD as Consulting Physician (Radiation Oncology) [name], NP as Nurse Practitioner (Obstetrics and Gynecology) OTHER MD:  CHIEF COMPLAINT: Triple negative breast cancer  CURRENT TREATMENT: Neoadjuvant chemotherapy   HISTORY OF CURRENT ILLNESS: From the original intake note:  Amy Dawson felt some discomfort in the left breast in June 2018, but did not think much of it.  However in October she palpated a change in the upper outer quadrant of her left breast and she brought this to medical attention.  On May 09, 2017 she had bilateral diagnostic mammography with tomography at the breast center, showing the breast density to be category capital C.  In the left breast that was not obscured mass in the upper outer quadrant, which by palpation was identified at the 1230 o'clock radiant 5 cm from the nipple.  By ultrasound this measured 2.9 cm, and it was irregular and hypoechoic.  The left axilla was sonographically benign.  Biopsy of the left breast mass in question May 14, 2017 showed [phone]) and invasive ductal carcinoma, grade 2 or more likely 3, estrogen and progesterone receptor negative, with no HER-2 amplification, the signals ratio being 1.18 and the number per cell 1.65.  The key 67 was 80%.  The patient's subsequent history is as detailed below.  INTERVAL HISTORY: Amy Dawson returns today for follow-up and treatment of her triple negative breast cancer.  Today is day 8 cycle 4 of 4 planned cycles of cyclophosphamide and doxorubicin, given in dose dense fashion, to be followed by weekly paclitaxel and carboplatin x12, after  which she will proceed to surgery. She notes that she has been nauseous. She notes that she needs to drink more water, but water doesn't taste good.   REVIEW OF SYSTEMS: Amy Dawson reports that she is doing okay. She notes that as long as she drinks at least 120 oz of water and takes Miralax, she wont be constipated. She notes that she doesn't want to have another enema. She reports that she has a rash. She notes that she has some blurriness in her vision. For exercise, she walks about 2 miles per day. She denies unusual headaches, visual changes, nausea, vomiting, or dizziness. There has been no unusual cough, phlegm production, or pleurisy. This been no change in bowel or bladder habits. She denies unexplained fatigue or unexplained weight loss, bleeding, rash, or fever. A detailed review of systems was otherwise stable.   PAST MEDICAL HISTORY: Past Medical History:  Diagnosis Date  . Allergy   . Anxiety   . Breast cancer ([place])   . Chronic kidney disease    bladder issues  . Depression   . Family history of breast cancer   . Fibromyalgia   . Genetic testing 06/13/2017   Breast/GYN panel (23 genes) @ [facility] - No pathogenic mutations detected  . Headache    smokes marijuana for migraine pain  . Murmur, cardiac    as a child  . Stroke ([place]) 2000   numbness on left side  . TMJ (dislocation of temporomandibular joint)     PAST SURGICAL HISTORY: Past Surgical History:  Procedure Laterality Date  . CESAREAN SECTION     2  . ENDOMETRIAL ABLATION  2011  . PORTACATH PLACEMENT Right 05/31/2017   Procedure: INSERTION PORT-A-CATH WITH [place];  Surgeon: [name], MD;  Location: [facility];  Service: General;  Laterality: Right;    FAMILY HISTORY Family History  Problem Relation Age of Onset  . Liver cancer Sister        "rare type"; deceased 32  . Breast cancer Maternal Aunt        Dx 23s; currently 34s  . Breast cancer Paternal Grandmother        Dx 68s; deceased  56s  . Breast cancer Other        mat grandfather's sister; dx 46s; deceased 34s  . Cancer Paternal Aunt        unk. type; deceased 53  . Colon cancer Neg Hx   . Esophageal cancer Neg Hx   . Rectal cancer Neg Hx   . Stomach cancer Neg Hx   The patient's parents are in their mid 29s as of November 2018.  The patient had 1 brother, 1 sister.  The sister died from a rare liver cancer at age 7.  A maternal aunt and a paternal grandmother were diagnosed with breast cancer in their 91s.  GYNECOLOGIC HISTORY:  No LMP recorded. Patient has had an ablation. Menarche age 23.  She underwent endometrial ablation 2011.  She is no longer having periods.  She never used hormone replacement.  First live birth was age 7.  She is GX P2.   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  Her husband [name] works as an Firefighter for [employer]  Daughter Denee Boeder attends [employer].  Son Marti Mclane is doing on apprenticeship in the [place] program.    ADVANCED DIRECTIVES: Not in place   HEALTH MAINTENANCE: Social History   Tobacco Use  . Smoking status: Former Research scientist (life sciences)  . Smokeless tobacco: Never Used  Substance Use Topics  . Alcohol use: Yes    Alcohol/week: 0.0 oz    Comment: sometimes  . Drug use: Yes    Types: Marijuana    Comment: last smoked 05-29-17     Colonoscopy: 2016/[facility]  PAP: October 2018  Bone density: Never   Allergies  Allergen Reactions  . Other Other (See Comments)    Red wine & white cheese: migraine Raw egg whites- hives    Current Outpatient Medications  Medication Sig Dispense Refill  . amitriptyline (ELAVIL) 50 MG tablet Take 25 mg by mouth at bedtime as needed for sleep.    . Aspirin-Acetaminophen-Caffeine (EXCEDRIN PO) Take by mouth.    . Calcium Glycerophosphate (PRELIEF PO) Take 3 tablets by mouth.    . Cetirizine HCl (ZYRTEC ALLERGY PO) Take by mouth daily.    Marland Kitchen dexamethasone (DECADRON) 4 MG  tablet Take 2 tablets by mouth once a day on the day after chemotherapy and then take 2 tablets two times a day for 2 days. Take with food. 30 tablet 1  . dimenhyDRINATE (DRAMAMINE) 50 MG tablet Take 50 mg every 8 (eight) hours as needed by mouth.    . fluconazole (DIFLUCAN) 150 MG tablet Take 1 tablet by mouth daily.    . fluticasone (FLONASE) 50 MCG/ACT nasal spray Place 2 sprays into both nostrils daily.    . Homeopathic Products (AZO YEAST PLUS PO) Take by mouth.    . lidocaine-prilocaine (EMLA) cream Apply to affected area once 30 g 3  . LORazepam (ATIVAN) 0.5 MG tablet TAKE 1 TABLET  BY MOUTH AT BEDTIME AS NEEDED (NAUSEA/VOMITING) 30 tablet 0  . nitrofurantoin, macrocrystal-monohydrate, (MACROBID) 100 MG capsule Take 1 capsule (100 mg total) 2 (two) times daily by mouth. 14 capsule 0  . phenazopyridine (PYRIDIUM) 200 MG tablet Take 200 mg by mouth every 8 (eight) hours as needed.    . Phenazopyridine HCl (AZO-STANDARD PO) Take by mouth.    . prochlorperazine (COMPAZINE) 10 MG tablet Take 1 tablet (10 mg total) every 6 (six) hours as needed by mouth (Nausea or vomiting). (Patient not taking: Reported on 07/02/2017) 30 tablet 1  . Pseudoephedrine HCl (SUDAFED PO) Take by mouth as needed.    . Pumpkin Seed-Soy Germ (AZO BLADDER CONTROL/GO-LESS) CAPS Take by mouth.    . traMADol (ULTRAM) 50 MG tablet Take 2 tablets (100 mg total) every 6 (six) hours as needed by mouth. (Patient not taking: Reported on 07/02/2017) 10 tablet 0   No current facility-administered medications for this visit.     OBJECTIVE: Middle-aged white woman in no acute distress  Vitals:   07/24/17 1504  BP: (!) 131/117  Pulse: 83  Resp: 16  Temp: 97.9 F (36.6 C)  SpO2: 100%     Body mass index is 26.31 kg/m.   Wt Readings from Last 3 Encounters:  07/24/17 176 lb 14.4 oz (80.2 kg)  07/17/17 175 lb 14.4 oz (79.8 kg)  07/02/17 175 lb 9.6 oz (79.7 kg)     ECOG FS:0 - Asymptomatic  Sclerae unicteric, pupils round  and equal Oropharynx clear and moist No cervical or supraclavicular adenopathy Lungs no rales or rhonchi Heart regular rate and rhythm Abd soft, nontender, positive bowel sounds MSK no focal spinal tenderness, no upper extremity lymphedema Neuro: nonfocal, well oriented, appropriate affect Breasts: The right breast is benign.  The mass in the left breast is still present, still changes the contour slightly, and seems similar to the last time.  It is still quite firm.  Both axillae are benign.  LAB RESULTS:  CMP     Component Value Date/Time   NA 138 07/24/2017 1447   NA 138 07/10/2017 1458   K 4.4 07/24/2017 1447   K 3.7 07/10/2017 1458   CL 103 07/24/2017 1447   CO2 28 07/24/2017 1447   CO2 25 07/10/2017 1458   GLUCOSE 156 (H) 07/24/2017 1447   GLUCOSE 95 07/10/2017 1458   BUN 8 07/24/2017 1447   BUN 7.4 07/10/2017 1458   CREATININE 0.82 07/24/2017 1447   CREATININE 0.7 07/10/2017 1458   CALCIUM 8.8 07/24/2017 1447   CALCIUM 8.9 07/10/2017 1458   PROT 6.4 07/24/2017 1447   PROT 7.1 07/10/2017 1458   ALBUMIN 3.5 07/24/2017 1447   ALBUMIN 3.8 07/10/2017 1458   AST 10 07/24/2017 1447   AST 10 07/10/2017 1458   ALT 9 07/24/2017 1447   ALT 11 07/10/2017 1458   ALKPHOS 82 07/24/2017 1447   ALKPHOS 92 07/10/2017 1458   BILITOT 0.4 07/24/2017 1447   BILITOT <0.22 07/10/2017 1458   GFRNONAA >60 07/24/2017 1447   GFRAA >60 07/24/2017 1447    No results found for: TOTALPROTELP, ALBUMINELP, A1GS, A2GS, BETS, BETA2SER, GAMS, MSPIKE, SPEI  No results found for: KPAFRELGTCHN, LAMBDASER, KAPLAMBRATIO  Lab Results  Component Value Date   WBC 2.2 (L) 07/24/2017   NEUTROABS 1.4 (L) 07/24/2017   HGB 10.9 (L) 07/24/2017   HCT 32.7 (L) 07/24/2017   MCV 93.4 07/24/2017   PLT 172 07/24/2017      Chemistry  Component Value Date/Time   NA 138 07/24/2017 1447   NA 138 07/10/2017 1458   K 4.4 07/24/2017 1447   K 3.7 07/10/2017 1458   CL 103 07/24/2017 1447   CO2 28  07/24/2017 1447   CO2 25 07/10/2017 1458   BUN 8 07/24/2017 1447   BUN 7.4 07/10/2017 1458   CREATININE 0.82 07/24/2017 1447   CREATININE 0.7 07/10/2017 1458      Component Value Date/Time   CALCIUM 8.8 07/24/2017 1447   CALCIUM 8.9 07/10/2017 1458   ALKPHOS 82 07/24/2017 1447   ALKPHOS 92 07/10/2017 1458   AST 10 07/24/2017 1447   AST 10 07/10/2017 1458   ALT 9 07/24/2017 1447   ALT 11 07/10/2017 1458   BILITOT 0.4 07/24/2017 1447   BILITOT <0.22 07/10/2017 1458       No results found for: LABCA2  No components found for: [id number]  No results for input(s): INR in the last 168 hours.  No results found for: LABCA2  No results found for: BJS283  No results found for: TDV761  No results found for: YWV371  No results found for: CA2729  No components found for: HGQUANT  No results found for: CEA1 / No results found for: CEA1   No results found for: AFPTUMOR  No results found for: CHROMOGRNA  No results found for: PSA1  Appointment on 07/24/2017  Component Date Value Ref Range Status  . WBC 07/24/2017 2.2* 3.9 - 10.3 K/uL Final  . RBC 07/24/2017 3.50* 3.70 - 5.45 MIL/uL Final  . Hemoglobin 07/24/2017 10.9* 11.6 - 15.9 g/dL Final  . HCT 07/24/2017 32.7* 34.8 - 46.6 % Final  . MCV 07/24/2017 93.4  79.5 - 101.0 fL Final  . MCH 07/24/2017 31.1  25.1 - 34.0 pg Final  . MCHC 07/24/2017 33.3  31.5 - 36.0 g/dL Final  . RDW 07/24/2017 15.5  11.2 - 16.1 % Final  . Platelets 07/24/2017 172  145 - 400 K/uL Final  . Neutrophils Relative % 07/24/2017 65  % Final  . Neutro Abs 07/24/2017 1.4* 1.5 - 6.5 K/uL Final  . Abs Granulocyte 07/24/2017 1.4* 1.5 - 6.5 K/uL Final  . Lymphocytes Relative 07/24/2017 29  % Final  . Lymphs Abs 07/24/2017 0.7* 0.9 - 3.3 K/uL Final  . Monocytes Relative 07/24/2017 5  % Final  . Monocytes Absolute 07/24/2017 0.1  0.1 - 0.9 K/uL Final  . Eosinophils Relative 07/24/2017 0  % Final  . Eosinophils Absolute 07/24/2017 0.0  0.0 - 0.5 K/uL Final   . Basophils Relative 07/24/2017 1  % Final  . Basophils Absolute 07/24/2017 0.0  0.0 - 0.1 K/uL Final   Performed at [facility], [place] [address], [place],  [address]  . Sodium 07/24/2017 138  136 - 145 mmol/L Final  . Potassium 07/24/2017 4.4  3.3 - 4.7 mmol/L Final  . Chloride 07/24/2017 103  98 - 109 mmol/L Final  . CO2 07/24/2017 28  22 - 29 mmol/L Final  . Glucose, Bld 07/24/2017 156* 70 - 140 mg/dL Final  . BUN 07/24/2017 8  7 - 26 mg/dL Final  . Creatinine, Ser 07/24/2017 0.82  0.60 - 1.10 mg/dL Final  . Calcium 07/24/2017 8.8  8.4 - 10.4 mg/dL Final  . Total Protein 07/24/2017 6.4  6.4 - 8.3 g/dL Final  . Albumin 07/24/2017 3.5  3.5 - 5.0 g/dL Final  . AST 07/24/2017 10  5 - 34 U/L Final  . ALT 07/24/2017 9  0 - 55 U/L Final  . Alkaline Phosphatase 07/24/2017 82  40 - 150 U/L Final  . Total Bilirubin 07/24/2017 0.4  0.2 - 1.2 mg/dL Final  . GFR calc non Af Amer 07/24/2017 >60  >60 mL/min Final  . GFR calc Af Amer 07/24/2017 >60  >60 mL/min Final   Comment: (NOTE) The eGFR has been calculated using the CKD EPI equation. This calculation has not been validated in all clinical situations. eGFR's persistently <60 mL/min signify possible Chronic Kidney Disease.   Georgiann Hahn gap 07/24/2017 7  3 - 11 Final   Performed at [facility], [address] [place], [place] [address]    (this displays the last labs from the last 3 days)  No results found for: TOTALPROTELP, ALBUMINELP, A1GS, A2GS, BETS, BETA2SER, GAMS, MSPIKE, SPEI (this displays SPEP labs)  No results found for: KPAFRELGTCHN, LAMBDASER, KAPLAMBRATIO (kappa/lambda light chains)  No results found for: HGBA, HGBA2QUANT, HGBFQUANT, HGBSQUAN (Hemoglobinopathy evaluation)   No results found for: LDH  No results found for: IRON, TIBC, IRONPCTSAT (Iron and TIBC)  No results found for: FERRITIN  Urinalysis    Component Value Date/Time   COLORURINE YELLOW  07/07/2017 1400   APPEARANCEUR CLEAR 07/07/2017 1400   LABSPEC 1.003 (L) 07/07/2017 1400   LABSPEC 1.010 05/30/2017 1315   PHURINE 7.0 07/07/2017 1400   GLUCOSEU NEGATIVE 07/07/2017 1400   GLUCOSEU Negative 05/30/2017 1315   HGBUR NEGATIVE 07/07/2017 1400   BILIRUBINUR NEGATIVE 07/07/2017 1400   BILIRUBINUR Color Interference 05/30/2017 1315   KETONESUR NEGATIVE 07/07/2017 1400   PROTEINUR NEGATIVE 07/07/2017 1400   UROBILINOGEN Color Interference 05/30/2017 1315   NITRITE NEGATIVE 07/07/2017 1400   LEUKOCYTESUR NEGATIVE 07/07/2017 1400   LEUKOCYTESUR Color Interference 05/30/2017 1315     STUDIES: [place] Axilla Left  Result Date: 06/26/2017 CLINICAL DATA:  Second-look ultrasound for abnormal lymph node seen in the left axilla on MRI. Patient has already had 2 rounds of neoadjuvant chemotherapy treatment for treatment of left breast cancer. EXAM: ULTRASOUND OF THE LEFT AXILLA COMPARISON:  MRI of the breast May 29, 2017, mammogram and ultrasound May 09, 2017. FINDINGS: Ultrasound is performed, showing no abnormal lymph node identified. IMPRESSION: Known cancer. RECOMMENDATION: Management on clinical basis. I have discussed the findings and recommendations with the patient. Results were also provided in writing at the conclusion of the visit. If applicable, a reminder letter will be sent to the patient regarding the next appointment. BI-RADS CATEGORY  6: Known biopsy-proven malignancy. Electronically Signed   By: Abelardo Diesel M.D.   On: 06/26/2017 13:12     ELIGIBLE FOR AVAILABLE RESEARCH PROTOCOL: SWOG 1418 depending on response to neoadjuvant treatment, UPBEAT  ASSESSMENT: 52 y.o. [place] status post left breast upper outer quadrant biopsy May 14, 2017 for a clinical T2 N0, stage IIB invasive ductal carcinoma, grade 3, triple negative, with an MIB-1 of 80%  (1) neoadjuvant chemotherapy will consist of doxorubicin and cyclophosphamide in dose dense fashion x4 starting  June 04, 2017, to be followed by weekly carboplatin and paclitaxel x12  (a) echo on 05/30/2017: LVEF 55-60%  (2) breast conserving surgery with sentinel lymph node sampling to follow  (3) adjuvant radiation to follow surgery  (4) genetics testing pending  PLAN: Ar[place]a has completed her first part of the chemotherapy, which is the more intense part.  I surprised we have not had more of a response so far, but of course she is only halfway there.  She will start the second part  of the chemotherapy 07/30/2017.  Today we discussed the possible toxicities side effects and complications of carboplatin and paclitaxel in detail.  She particularly was warned regarding neuropathy issues.  She tells me since she had her stroke she has had some numbness in her left hand.  Accordingly we will have to watch very carefully and if necessary we will switch her from Taxol to Gemzar.  I am going to see her with her second dose of carboplatin and paclitaxel to assess tolerance.  For the first dose she will continue the antinausea medicines as before.  Hopefully we will start paring these off with a second cycle  She knows to call for any other issues that may develop before her next visit.  [name], [name], MD  07/24/17 3:44 PM Medical Oncology and Hematology  [facility] [address] [place], [place] [address] Tel. [phone]    Fax. [phone]  This document serves as a record of services personally performed by Lurline Del, MD. It was created on his behalf by Sheron Nightingale, a trained medical scribe. The creation of this record is based on the scribe's personal observations and the provider's statements to them.   I have reviewed the above documentation for accuracy and completeness, and I agree with the above.

## 2017-07-24 ENCOUNTER — Inpatient Hospital Stay: Payer: 59

## 2017-07-24 ENCOUNTER — Inpatient Hospital Stay: Payer: 59 | Attending: Oncology | Admitting: Oncology

## 2017-07-24 ENCOUNTER — Other Ambulatory Visit: Payer: Self-pay | Admitting: Oncology

## 2017-07-24 ENCOUNTER — Other Ambulatory Visit: Payer: Self-pay | Admitting: *Deleted

## 2017-07-24 VITALS — BP 131/117 | HR 83 | Temp 97.9°F | Resp 16 | Ht 68.75 in | Wt 176.9 lb

## 2017-07-24 DIAGNOSIS — Z87898 Personal history of other specified conditions: Secondary | ICD-10-CM | POA: Diagnosis not present

## 2017-07-24 DIAGNOSIS — I69354 Hemiplegia and hemiparesis following cerebral infarction affecting left non-dominant side: Secondary | ICD-10-CM | POA: Diagnosis not present

## 2017-07-24 DIAGNOSIS — Z803 Family history of malignant neoplasm of breast: Secondary | ICD-10-CM | POA: Diagnosis not present

## 2017-07-24 DIAGNOSIS — C50412 Malignant neoplasm of upper-outer quadrant of left female breast: Secondary | ICD-10-CM

## 2017-07-24 DIAGNOSIS — C50919 Malignant neoplasm of unspecified site of unspecified female breast: Secondary | ICD-10-CM

## 2017-07-24 DIAGNOSIS — Z808 Family history of malignant neoplasm of other organs or systems: Secondary | ICD-10-CM | POA: Insufficient documentation

## 2017-07-24 DIAGNOSIS — Z171 Estrogen receptor negative status [ER-]: Secondary | ICD-10-CM | POA: Insufficient documentation

## 2017-07-24 DIAGNOSIS — R110 Nausea: Secondary | ICD-10-CM | POA: Insufficient documentation

## 2017-07-24 DIAGNOSIS — Z5111 Encounter for antineoplastic chemotherapy: Secondary | ICD-10-CM | POA: Insufficient documentation

## 2017-07-24 DIAGNOSIS — F1290 Cannabis use, unspecified, uncomplicated: Secondary | ICD-10-CM | POA: Diagnosis not present

## 2017-07-24 DIAGNOSIS — F1020 Alcohol dependence, uncomplicated: Secondary | ICD-10-CM | POA: Diagnosis not present

## 2017-07-24 DIAGNOSIS — I619 Nontraumatic intracerebral hemorrhage, unspecified: Secondary | ICD-10-CM

## 2017-07-24 LAB — CBC WITH DIFFERENTIAL/PLATELET
Abs Granulocyte: 1.4 10*3/uL — ABNORMAL LOW (ref 1.5–6.5)
BASOS ABS: 0 10*3/uL (ref 0.0–0.1)
BASOS PCT: 1 %
EOS ABS: 0 10*3/uL (ref 0.0–0.5)
EOS PCT: 0 %
HCT: 32.7 % — ABNORMAL LOW (ref 34.8–46.6)
HEMOGLOBIN: 10.9 g/dL — AB (ref 11.6–15.9)
Lymphocytes Relative: 29 %
Lymphs Abs: 0.7 10*3/uL — ABNORMAL LOW (ref 0.9–3.3)
MCH: 31.1 pg (ref 25.1–34.0)
MCHC: 33.3 g/dL (ref 31.5–36.0)
MCV: 93.4 fL (ref 79.5–101.0)
Monocytes Absolute: 0.1 10*3/uL (ref 0.1–0.9)
Monocytes Relative: 5 %
NEUTROS ABS: 1.4 10*3/uL — AB (ref 1.5–6.5)
NEUTROS PCT: 65 %
PLATELETS: 172 10*3/uL (ref 145–400)
RBC: 3.5 MIL/uL — AB (ref 3.70–5.45)
RDW: 15.5 % (ref 11.2–16.1)
WBC: 2.2 10*3/uL — AB (ref 3.9–10.3)

## 2017-07-24 LAB — COMPREHENSIVE METABOLIC PANEL
ALBUMIN: 3.5 g/dL (ref 3.5–5.0)
ALK PHOS: 82 U/L (ref 40–150)
ALT: 9 U/L (ref 0–55)
AST: 10 U/L (ref 5–34)
Anion gap: 7 (ref 3–11)
BUN: 8 mg/dL (ref 7–26)
CHLORIDE: 103 mmol/L (ref 98–109)
CO2: 28 mmol/L (ref 22–29)
CREATININE: 0.82 mg/dL (ref 0.60–1.10)
Calcium: 8.8 mg/dL (ref 8.4–10.4)
GFR calc non Af Amer: >60 mL/min (ref >60)
GLUCOSE: 156 mg/dL — AB (ref 70–140)
Potassium: 4.4 mmol/L (ref 3.3–4.7)
SODIUM: 138 mmol/L (ref 136–145)
Total Bilirubin: 0.4 mg/dL (ref 0.2–1.2)
Total Protein: 6.4 g/dL (ref 6.4–8.3)

## 2017-07-24 NOTE — Progress Notes (Signed)
DISCONTINUE ON PATHWAY REGIMEN - Breast     A cycle is every 21 days:     Doxorubicin      Cyclophosphamide      Paclitaxel      Carboplatin   **Always confirm dose/schedule in your pharmacy ordering system**    REASON: Other Reason PRIOR TREATMENT: [id number]: AC [Doxorubicin + Cyclophosphamide q21 Days x 4 Cycles], Followed by Paclitaxel 80 mg/m2 Weekly + Carboplatin AUC=6 q21 Days x 12 Weeks TREATMENT RESPONSE: Unable to Evaluate  START OFF PATHWAY REGIMEN - Breast   [id number]:Carboplatin + Paclitaxel (2/80) weekly:   Administer weekly:     Paclitaxel      Carboplatin   **Always confirm dose/schedule in your pharmacy ordering system**    Patient Characteristics: Preoperative or Nonsurgical Candidate (Clinical Staging), Neoadjuvant Therapy followed by Surgery, Invasive Disease, Chemotherapy, HER2 Negative/Unknown/Equivocal, ER Negative/Unknown, Platinum Therapy Indicated Therapeutic Status: Preoperative or Nonsurgical Candidate (Clinical Staging) AJCC M Category: cM0 AJCC Grade: G3 Breast Surgical Plan: Neoadjuvant Therapy followed by Surgery ER Status: Negative (-) AJCC 8 Stage Grouping: IIB HER2 Status: Negative (-) AJCC T Category: cT2 AJCC N Category: cN0 PR Status: Negative (-) Type of Therapy: Platinum Therapy Indicated Intent of Therapy: Curative Intent, Discussed with Patient

## 2017-07-30 ENCOUNTER — Inpatient Hospital Stay: Payer: 59

## 2017-07-30 ENCOUNTER — Other Ambulatory Visit: Payer: Self-pay | Admitting: Oncology

## 2017-07-30 VITALS — BP 125/86 | HR 94 | Temp 98.9°F | Resp 17 | Wt 177.8 lb

## 2017-07-30 DIAGNOSIS — Z171 Estrogen receptor negative status [ER-]: Secondary | ICD-10-CM

## 2017-07-30 DIAGNOSIS — R229 Localized swelling, mass and lump, unspecified: Secondary | ICD-10-CM

## 2017-07-30 DIAGNOSIS — IMO0002 Reserved for concepts with insufficient information to code with codable children: Secondary | ICD-10-CM

## 2017-07-30 DIAGNOSIS — Z5111 Encounter for antineoplastic chemotherapy: Secondary | ICD-10-CM | POA: Diagnosis not present

## 2017-07-30 DIAGNOSIS — C50412 Malignant neoplasm of upper-outer quadrant of left female breast: Secondary | ICD-10-CM

## 2017-07-30 DIAGNOSIS — Z95828 Presence of other vascular implants and grafts: Secondary | ICD-10-CM

## 2017-07-30 DIAGNOSIS — N6310 Unspecified lump in the right breast, unspecified quadrant: Secondary | ICD-10-CM | POA: Insufficient documentation

## 2017-07-30 LAB — CBC WITH DIFFERENTIAL/PLATELET
BASOS ABS: 0.1 10*3/uL (ref 0.0–0.1)
BASOS PCT: 0 %
Eosinophils Absolute: 0 10*3/uL (ref 0.0–0.5)
Eosinophils Relative: 0 %
HEMATOCRIT: 32.8 % — AB (ref 34.8–46.6)
HEMOGLOBIN: 10.8 g/dL — AB (ref 11.6–15.9)
Lymphocytes Relative: 6 %
Lymphs Abs: 1 10*3/uL (ref 0.9–3.3)
MCH: 31.1 pg (ref 25.1–34.0)
MCHC: 33 g/dL (ref 31.5–36.0)
MCV: 94 fL (ref 79.5–101.0)
Monocytes Absolute: 1.6 10*3/uL — ABNORMAL HIGH (ref 0.1–0.9)
Monocytes Relative: 10 %
NEUTROS ABS: 13.7 10*3/uL — AB (ref 1.5–6.5)
NEUTROS PCT: 84 %
Platelets: 175 10*3/uL (ref 145–400)
RBC: 3.48 MIL/uL — ABNORMAL LOW (ref 3.70–5.45)
RDW: 17.3 % — AB (ref 11.2–16.1)
WBC: 16.4 10*3/uL — ABNORMAL HIGH (ref 3.9–10.3)

## 2017-07-30 LAB — COMPREHENSIVE METABOLIC PANEL
ALK PHOS: 91 U/L (ref 40–150)
ALT: 13 U/L (ref 0–55)
ANION GAP: 12 — AB (ref 3–11)
AST: 16 U/L (ref 5–34)
Albumin: 3.6 g/dL (ref 3.5–5.0)
BUN: 10 mg/dL (ref 7–26)
CALCIUM: 9 mg/dL (ref 8.4–10.4)
CO2: 25 mmol/L (ref 22–29)
Chloride: 101 mmol/L (ref 98–109)
Creatinine, Ser: 0.7 mg/dL (ref 0.60–1.10)
Glucose, Bld: 95 mg/dL (ref 70–140)
Potassium: 3.7 mmol/L (ref 3.3–4.7)
SODIUM: 138 mmol/L (ref 136–145)
TOTAL PROTEIN: 6.8 g/dL (ref 6.4–8.3)
Total Bilirubin: <0.2 mg/dL — ABNORMAL LOW (ref 0.2–1.2)

## 2017-07-30 MED ORDER — SODIUM CHLORIDE 0.9 % IV SOLN
80.0000 mg/m2 | Freq: Once | INTRAVENOUS | Status: AC
  Administered 2017-07-30: 156 mg via INTRAVENOUS
  Filled 2017-07-30: qty 26

## 2017-07-30 MED ORDER — HEPARIN SOD (PORK) LOCK FLUSH 100 UNIT/ML IV SOLN
500.0000 [IU] | Freq: Once | INTRAVENOUS | Status: AC | PRN
  Administered 2017-07-30: 500 [IU]
  Filled 2017-07-30: qty 5

## 2017-07-30 MED ORDER — SODIUM CHLORIDE 0.9% FLUSH
10.0000 mL | INTRAVENOUS | Status: DC | PRN
  Administered 2017-07-30: 10 mL
  Filled 2017-07-30: qty 10

## 2017-07-30 MED ORDER — SODIUM CHLORIDE 0.9% FLUSH
10.0000 mL | Freq: Once | INTRAVENOUS | Status: AC
  Administered 2017-07-30: 10 mL
  Filled 2017-07-30: qty 10

## 2017-07-30 MED ORDER — SODIUM CHLORIDE 0.9 % IV SOLN
Freq: Once | INTRAVENOUS | Status: AC
  Administered 2017-07-30: 12:00:00 via INTRAVENOUS

## 2017-07-30 MED ORDER — FAMOTIDINE IN NACL 20-0.9 MG/50ML-% IV SOLN
INTRAVENOUS | Status: AC
  Filled 2017-07-30: qty 50

## 2017-07-30 MED ORDER — DEXAMETHASONE SODIUM PHOSPHATE 100 MG/10ML IJ SOLN
20.0000 mg | Freq: Once | INTRAMUSCULAR | Status: AC
  Administered 2017-07-30: 20 mg via INTRAVENOUS
  Filled 2017-07-30: qty 2

## 2017-07-30 MED ORDER — DIPHENHYDRAMINE HCL 50 MG/ML IJ SOLN
INTRAMUSCULAR | Status: AC
  Filled 2017-07-30: qty 1

## 2017-07-30 MED ORDER — DIPHENHYDRAMINE HCL 50 MG/ML IJ SOLN
25.0000 mg | Freq: Once | INTRAMUSCULAR | Status: AC
  Administered 2017-07-30: 25 mg via INTRAVENOUS

## 2017-07-30 MED ORDER — PALONOSETRON HCL INJECTION 0.25 MG/5ML
INTRAVENOUS | Status: AC
  Filled 2017-07-30: qty 5

## 2017-07-30 MED ORDER — CARBOPLATIN CHEMO INJECTION 450 MG/45ML
258.2000 mg | Freq: Once | INTRAVENOUS | Status: AC
  Administered 2017-07-30: 260 mg via INTRAVENOUS
  Filled 2017-07-30: qty 26

## 2017-07-30 MED ORDER — FAMOTIDINE IN NACL 20-0.9 MG/50ML-% IV SOLN
20.0000 mg | Freq: Once | INTRAVENOUS | Status: AC
  Administered 2017-07-30: 20 mg via INTRAVENOUS

## 2017-07-30 MED ORDER — PALONOSETRON HCL INJECTION 0.25 MG/5ML
0.2500 mg | Freq: Once | INTRAVENOUS | Status: AC
  Administered 2017-07-30: 0.25 mg via INTRAVENOUS

## 2017-07-30 NOTE — Progress Notes (Signed)
[facility]  [phone] [phone]     ID: Amy Dawson DOB: Nov 19, 1965  MR#: [id number]  KKX#:[id number]  Patient Care Team: [name], MD as PCP - General (Family Medicine) Elyjah Hazan, [name], MD as Consulting Physician (Oncology) [name], MD as Consulting Physician (General Surgery) [name], MD as Consulting Physician (Radiation Oncology) [name], NP as Nurse Practitioner (Obstetrics and Gynecology) OTHER MD:  CHIEF COMPLAINT: Triple negative breast cancer  CURRENT TREATMENT: Neoadjuvant chemotherapy   HISTORY OF CURRENT ILLNESS: From the original intake note:  Amy Dawson felt some discomfort in the left breast in June 2018, but did not think much of it.  However in October she palpated a change in the upper outer quadrant of her left breast and she brought this to medical attention.  On May 09, 2017 she had bilateral diagnostic mammography with tomography at the breast center, showing the breast density to be category capital C.  In the left breast that was not obscured mass in the upper outer quadrant, which by palpation was identified at the 1230 o'clock radiant 5 cm from the nipple.  By ultrasound this measured 2.9 cm, and it was irregular and hypoechoic.  The left axilla was sonographically benign.  Biopsy of the left breast mass in question May 14, 2017 showed [phone]) and invasive ductal carcinoma, grade 2 or more likely 3, estrogen and progesterone receptor negative, with no HER-2 amplification, the signals ratio being 1.18 and the number per cell 1.65.  The key 67 was 80%.  The patient's subsequent history is as detailed below.  INTERVAL HISTORY: Amy Dawson returns today for follow-up and treatment of her triple negative breast cancer.  Today is day 8 cycle 4 of 4 planned cycles of cyclophosphamide and doxorubicin, given in dose dense fashion, to be followed by weekly paclitaxel and carboplatin x12, after  which she will proceed to surgery. She notes that she has been nauseous. She notes that she needs to drink more water, but water doesn't taste good.   REVIEW OF SYSTEMS: Amy Dawson reports that she is doing okay. She notes that as long as she drinks at least 120 oz of water and takes Miralax, she wont be constipated. She notes that she doesn't want to have another enema. She reports that she has a rash. She notes that she has some blurriness in her vision. For exercise, she walks about 2 miles per day. She denies unusual headaches, visual changes, nausea, vomiting, or dizziness. There has been no unusual cough, phlegm production, or pleurisy. This been no change in bowel or bladder habits. She denies unexplained fatigue or unexplained weight loss, bleeding, rash, or fever. A detailed review of systems was otherwise stable.   PAST MEDICAL HISTORY: Past Medical History:  Diagnosis Date  . Allergy   . Anxiety   . Breast cancer ([place])   . Chronic kidney disease    bladder issues  . Depression   . Family history of breast cancer   . Fibromyalgia   . Genetic testing 06/13/2017   Breast/GYN panel (23 genes) @ [facility] - No pathogenic mutations detected  . Headache    smokes marijuana for migraine pain  . Murmur, cardiac    as a child  . Stroke ([place]) 2000   numbness on left side  . TMJ (dislocation of temporomandibular joint)     PAST SURGICAL HISTORY: Past Surgical History:  Procedure Laterality Date  . CESAREAN SECTION     2  . ENDOMETRIAL ABLATION  2011  . PORTACATH PLACEMENT Right 05/31/2017   Procedure: INSERTION PORT-A-CATH WITH [place];  Surgeon: [name], MD;  Location: [facility];  Service: General;  Laterality: Right;    FAMILY HISTORY Family History  Problem Relation Age of Onset  . Liver cancer Sister        "rare type"; deceased 56  . Breast cancer Maternal Aunt        Dx 85s; currently 44s  . Breast cancer Paternal Grandmother        Dx 63s; deceased  12s  . Breast cancer Other        mat grandfather's sister; dx 3s; deceased 92s  . Cancer Paternal Aunt        unk. type; deceased 36  . Colon cancer Neg Hx   . Esophageal cancer Neg Hx   . Rectal cancer Neg Hx   . Stomach cancer Neg Hx   The patient's parents are in their mid 24s as of November 2018.  The patient had 1 brother, 1 sister.  The sister died from a rare liver cancer at age 19.  A maternal aunt and a paternal grandmother were diagnosed with breast cancer in their 36s.  GYNECOLOGIC HISTORY:  No LMP recorded. Patient has had an ablation. Menarche age 65.  She underwent endometrial ablation 2011.  She is no longer having periods.  She never used hormone replacement.  First live birth was age 64.  She is GX P2.   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  Her husband [name] works as an Firefighter for [employer]  Daughter Jashira Cotugno attends [employer].  Son Timiya Howells is doing on apprenticeship in the [place] program.    ADVANCED DIRECTIVES: Not in place   HEALTH MAINTENANCE: Social History   Tobacco Use  . Smoking status: Former Research scientist (life sciences)  . Smokeless tobacco: Never Used  Substance Use Topics  . Alcohol use: Yes    Alcohol/week: 0.0 oz    Comment: sometimes  . Drug use: Yes    Types: Marijuana    Comment: last smoked 05-29-17     Colonoscopy: 2016/[facility]  PAP: October 2018  Bone density: Never   Allergies  Allergen Reactions  . Other Other (See Comments)    Red wine & white cheese: migraine Raw egg whites- hives    Current Outpatient Medications  Medication Sig Dispense Refill  . amitriptyline (ELAVIL) 50 MG tablet Take 25 mg by mouth at bedtime as needed for sleep.    . Aspirin-Acetaminophen-Caffeine (EXCEDRIN PO) Take by mouth.    . Calcium Glycerophosphate (PRELIEF PO) Take 3 tablets by mouth.    . Cetirizine HCl (ZYRTEC ALLERGY PO) Take by mouth daily.    Marland Kitchen dimenhyDRINATE (DRAMAMINE) 50 MG  tablet Take 50 mg every 8 (eight) hours as needed by mouth.    . fluconazole (DIFLUCAN) 150 MG tablet Take 1 tablet by mouth daily.    . fluticasone (FLONASE) 50 MCG/ACT nasal spray Place 2 sprays into both nostrils daily.    . Homeopathic Products (AZO YEAST PLUS PO) Take by mouth.    . nitrofurantoin, macrocrystal-monohydrate, (MACROBID) 100 MG capsule Take 1 capsule (100 mg total) 2 (two) times daily by mouth. 14 capsule 0  . phenazopyridine (PYRIDIUM) 200 MG tablet Take 200 mg by mouth every 8 (eight) hours as needed.    . Phenazopyridine HCl (AZO-STANDARD PO) Take by mouth.    . Pseudoephedrine HCl (SUDAFED PO) Take  by mouth as needed.    . Pumpkin Seed-Soy Germ (AZO BLADDER CONTROL/GO-LESS) CAPS Take by mouth.    . traMADol (ULTRAM) 50 MG tablet Take 2 tablets (100 mg total) every 6 (six) hours as needed by mouth. (Patient not taking: Reported on 07/02/2017) 10 tablet 0   No current facility-administered medications for this visit.     OBJECTIVE: Middle-aged white woman in no acute distress  There were no vitals filed for this visit.   There is no height or weight on file to calculate BMI.   Wt Readings from Last 3 Encounters:  07/24/17 176 lb 14.4 oz (80.2 kg)  07/17/17 175 lb 14.4 oz (79.8 kg)  07/02/17 175 lb 9.6 oz (79.7 kg)     ECOG FS:0 - Asymptomatic  Sclerae unicteric, pupils round and equal Oropharynx clear and moist No cervical or supraclavicular adenopathy Lungs no rales or rhonchi Heart regular rate and rhythm Abd soft, nontender, positive bowel sounds MSK no focal spinal tenderness, no upper extremity lymphedema Neuro: nonfocal, well oriented, appropriate affect Breasts: The right breast is benign.  The mass in the left breast is still present, still changes the contour slightly, and seems similar to the last time.  It is still quite firm.  Both axillae are benign.  LAB RESULTS:  CMP     Component Value Date/Time   NA 138 07/24/2017 1447   NA 138 07/10/2017  1458   K 4.4 07/24/2017 1447   K 3.7 07/10/2017 1458   CL 103 07/24/2017 1447   CO2 28 07/24/2017 1447   CO2 25 07/10/2017 1458   GLUCOSE 156 (H) 07/24/2017 1447   GLUCOSE 95 07/10/2017 1458   BUN 8 07/24/2017 1447   BUN 7.4 07/10/2017 1458   CREATININE 0.82 07/24/2017 1447   CREATININE 0.7 07/10/2017 1458   CALCIUM 8.8 07/24/2017 1447   CALCIUM 8.9 07/10/2017 1458   PROT 6.4 07/24/2017 1447   PROT 7.1 07/10/2017 1458   ALBUMIN 3.5 07/24/2017 1447   ALBUMIN 3.8 07/10/2017 1458   AST 10 07/24/2017 1447   AST 10 07/10/2017 1458   ALT 9 07/24/2017 1447   ALT 11 07/10/2017 1458   ALKPHOS 82 07/24/2017 1447   ALKPHOS 92 07/10/2017 1458   BILITOT 0.4 07/24/2017 1447   BILITOT <0.22 07/10/2017 1458   GFRNONAA >60 07/24/2017 1447   GFRAA >60 07/24/2017 1447    No results found for: TOTALPROTELP, ALBUMINELP, A1GS, A2GS, BETS, BETA2SER, GAMS, MSPIKE, SPEI  No results found for: Nils Pyle, KAPLAMBRATIO  Lab Results  Component Value Date   WBC 16.4 (H) 07/30/2017   NEUTROABS 13.7 (H) 07/30/2017   HGB 10.8 (L) 07/30/2017   HCT 32.8 (L) 07/30/2017   MCV 94.0 07/30/2017   PLT 175 07/30/2017      Chemistry      Component Value Date/Time   NA 138 07/24/2017 1447   NA 138 07/10/2017 1458   K 4.4 07/24/2017 1447   K 3.7 07/10/2017 1458   CL 103 07/24/2017 1447   CO2 28 07/24/2017 1447   CO2 25 07/10/2017 1458   BUN 8 07/24/2017 1447   BUN 7.4 07/10/2017 1458   CREATININE 0.82 07/24/2017 1447   CREATININE 0.7 07/10/2017 1458      Component Value Date/Time   CALCIUM 8.8 07/24/2017 1447   CALCIUM 8.9 07/10/2017 1458   ALKPHOS 82 07/24/2017 1447   ALKPHOS 92 07/10/2017 1458   AST 10 07/24/2017 1447   AST 10 07/10/2017 1458   ALT 9  07/24/2017 1447   ALT 11 07/10/2017 1458   BILITOT 0.4 07/24/2017 1447   BILITOT <0.22 07/10/2017 1458       No results found for: LABCA2  No components found for: EPPIRJ188  No results for input(s): INR in the last 168  hours.  No results found for: LABCA2  No results found for: CZY606  No results found for: TKZ601  No results found for: UXN235  No results found for: CA2729  No components found for: HGQUANT  No results found for: CEA1 / No results found for: CEA1   No results found for: AFPTUMOR  No results found for: CHROMOGRNA  No results found for: PSA1  Appointment on 07/30/2017  Component Date Value Ref Range Status  . WBC 07/30/2017 16.4* 3.9 - 10.3 K/uL Final  . RBC 07/30/2017 3.48* 3.70 - 5.45 MIL/uL Final  . Hemoglobin 07/30/2017 10.8* 11.6 - 15.9 g/dL Final  . HCT 07/30/2017 32.8* 34.8 - 46.6 % Final  . MCV 07/30/2017 94.0  79.5 - 101.0 fL Final  . MCH 07/30/2017 31.1  25.1 - 34.0 pg Final  . MCHC 07/30/2017 33.0  31.5 - 36.0 g/dL Final  . RDW 07/30/2017 17.3* 11.2 - 16.1 % Final  . Platelets 07/30/2017 175  145 - 400 K/uL Final  . Neutrophils Relative % 07/30/2017 84  % Final  . Neutro Abs 07/30/2017 13.7* 1.5 - 6.5 K/uL Final  . Lymphocytes Relative 07/30/2017 6  % Final  . Lymphs Abs 07/30/2017 1.0  0.9 - 3.3 K/uL Final  . Monocytes Relative 07/30/2017 10  % Final  . Monocytes Absolute 07/30/2017 1.6* 0.1 - 0.9 K/uL Final  . Eosinophils Relative 07/30/2017 0  % Final  . Eosinophils Absolute 07/30/2017 0.0  0.0 - 0.5 K/uL Final  . Basophils Relative 07/30/2017 0  % Final  . Basophils Absolute 07/30/2017 0.1  0.0 - 0.1 K/uL Final   Performed at [facility], [address] [place], [place] [address]    (this displays the last labs from the last 3 days)  No results found for: TOTALPROTELP, ALBUMINELP, A1GS, A2GS, BETS, BETA2SER, GAMS, MSPIKE, SPEI (this displays SPEP labs)  No results found for: KPAFRELGTCHN, LAMBDASER, KAPLAMBRATIO (kappa/lambda light chains)  No results found for: HGBA, HGBA2QUANT, HGBFQUANT, HGBSQUAN (Hemoglobinopathy evaluation)   No results found for: LDH  No results found for: IRON, TIBC, IRONPCTSAT (Iron and  TIBC)  No results found for: FERRITIN  Urinalysis    Component Value Date/Time   COLORURINE YELLOW 07/07/2017 1400   APPEARANCEUR CLEAR 07/07/2017 1400   LABSPEC 1.003 (L) 07/07/2017 1400   LABSPEC 1.010 05/30/2017 1315   PHURINE 7.0 07/07/2017 1400   GLUCOSEU NEGATIVE 07/07/2017 1400   GLUCOSEU Negative 05/30/2017 1315   HGBUR NEGATIVE 07/07/2017 1400   BILIRUBINUR NEGATIVE 07/07/2017 1400   BILIRUBINUR Color Interference 05/30/2017 1315   KETONESUR NEGATIVE 07/07/2017 1400   PROTEINUR NEGATIVE 07/07/2017 1400   UROBILINOGEN Color Interference 05/30/2017 1315   NITRITE NEGATIVE 07/07/2017 1400   LEUKOCYTESUR NEGATIVE 07/07/2017 1400   LEUKOCYTESUR Color Interference 05/30/2017 1315     STUDIES: No results found.   ELIGIBLE FOR AVAILABLE RESEARCH PROTOCOL: SWOG 0254 depending on response to neoadjuvant treatment, UPBEAT  ASSESSMENT: 52 y.o. [place] status post left breast upper outer quadrant biopsy May 14, 2017 for a clinical T2 N0, stage IIB invasive ductal carcinoma, grade 3, triple negative, with an MIB-1 of 80%  (1) neoadjuvant chemotherapy will consist of doxorubicin and cyclophosphamide in dose dense fashion  x4 starting June 04, 2017, to be followed by weekly carboplatin and paclitaxel x12  (a) echo on 05/30/2017: LVEF 55-60%  (2) breast conserving surgery with sentinel lymph node sampling to follow  (3) adjuvant radiation to follow surgery  (4) genetics testing pending  PLAN: Amy Dawson has completed her first part of the chemotherapy, which is the more intense part.  I surprised we have not had more of a response so far, but of course she is only halfway there.  She will start the second part of the chemotherapy 07/30/2017.  Today we discussed the possible toxicities side effects and complications of carboplatin and paclitaxel in detail.  She particularly was warned regarding neuropathy issues.  She tells me since she had her stroke she has had some  numbness in her left hand.  Accordingly we will have to watch very carefully and if necessary we will switch her from Taxol to Gemzar.  I am going to see her with her second dose of carboplatin and paclitaxel to assess tolerance.  For the first dose she will continue the antinausea medicines as before.  Hopefully we will start paring these off with a second cycle  She knows to call for any other issues that may develop before her next visit.  Promise Bushong, [name], MD  07/30/17 12:28 PM Medical Oncology and Hematology  [facility] [address] [place], [place] [address] Tel. [phone]    Fax. [phone]  This document serves as a record of services personally performed by Lurline Del, MD. It was created on his behalf by Sheron Nightingale, a trained medical scribe. The creation of this record is based on the scribe's personal observations and the provider's statements to them.   I have reviewed the above documentation for accuracy and completeness, and I agree with the above.

## 2017-07-30 NOTE — Patient Instructions (Signed)
[facility] Discharge Instructions for Patients Receiving Chemotherapy  Today you received the following chemotherapy agents Paclitaxel; Carboplatin  To help prevent nausea and vomiting after your treatment, we encourage you to take your nausea medication as directed    If you develop nausea and vomiting that is not controlled by your nausea medication, call the clinic.   BELOW ARE SYMPTOMS THAT SHOULD BE REPORTED IMMEDIATELY:  *FEVER GREATER THAN 100.5 F  *CHILLS WITH OR WITHOUT FEVER  NAUSEA AND VOMITING THAT IS NOT CONTROLLED WITH YOUR NAUSEA MEDICATION  *UNUSUAL SHORTNESS OF BREATH  *UNUSUAL BRUISING OR BLEEDING  TENDERNESS IN MOUTH AND THROAT WITH OR WITHOUT PRESENCE OF ULCERS  *URINARY PROBLEMS  *BOWEL PROBLEMS  UNUSUAL RASH Items with * indicate a potential emergency and should be followed up as soon as possible.  Feel free to call the clinic should you have any questions or concerns. The clinic phone number is [phone].  Please show the CHEMO ALERT CARD at check-in to the Emergency Department and triage nurse.  Paclitaxel injection What is this medicine? PACLITAXEL (PAK li TAX el) is a chemotherapy drug. It targets fast dividing cells, like cancer cells, and causes these cells to die. This medicine is used to treat ovarian cancer, breast cancer, and other cancers. This medicine may be used for other purposes; ask your health care provider or pharmacist if you have questions. COMMON BRAND NAME(S): Onxol, Taxol What should I tell my health care provider before I take this medicine? They need to know if you have any of these conditions: -blood disorders -irregular heartbeat -infection (especially a virus infection such as chickenpox, cold sores, or herpes) -liver disease -previous or ongoing radiation therapy -an unusual or allergic reaction to paclitaxel, alcohol, polyoxyethylated castor oil, other chemotherapy agents, other medicines, foods,  dyes, or preservatives -pregnant or trying to get pregnant -breast-feeding How should I use this medicine? This drug is given as an infusion into a vein. It is administered in a hospital or clinic by a specially trained health care professional. Talk to your pediatrician regarding the use of this medicine in children. Special care may be needed. Overdosage: If you think you have taken too much of this medicine contact a poison control center or emergency room at once. NOTE: This medicine is only for you. Do not share this medicine with others. What if I miss a dose? It is important not to miss your dose. Call your doctor or health care professional if you are unable to keep an appointment. What may interact with this medicine? Do not take this medicine with any of the following medications: -disulfiram -metronidazole This medicine may also interact with the following medications: -cyclosporine -diazepam -ketoconazole -medicines to increase blood counts like filgrastim, pegfilgrastim, sargramostim -other chemotherapy drugs like cisplatin, doxorubicin, epirubicin, etoposide, teniposide, vincristine -quinidine -testosterone -vaccines -verapamil Talk to your doctor or health care professional before taking any of these medicines: -acetaminophen -aspirin -ibuprofen -ketoprofen -naproxen This list may not describe all possible interactions. Give your health care provider a list of all the medicines, herbs, non-prescription drugs, or dietary supplements you use. Also tell them if you smoke, drink alcohol, or use illegal drugs. Some items may interact with your medicine. What should I watch for while using this medicine? Your condition will be monitored carefully while you are receiving this medicine. You will need important blood work done while you are taking this medicine. This medicine can cause serious allergic reactions. To reduce your risk you will need   to take other medicine(s)  before treatment with this medicine. If you experience allergic reactions like skin rash, itching or hives, swelling of the face, lips, or tongue, tell your doctor or health care professional right away. In some cases, you may be given additional medicines to help with side effects. Follow all directions for their use. This drug may make you feel generally unwell. This is not uncommon, as chemotherapy can affect healthy cells as well as cancer cells. Report any side effects. Continue your course of treatment even though you feel ill unless your doctor tells you to stop. Call your doctor or health care professional for advice if you get a fever, chills or sore throat, or other symptoms of a cold or flu. Do not treat yourself. This drug decreases your body's ability to fight infections. Try to avoid being around people who are sick. This medicine may increase your risk to bruise or bleed. Call your doctor or health care professional if you notice any unusual bleeding. Be careful brushing and flossing your teeth or using a toothpick because you may get an infection or bleed more easily. If you have any dental work done, tell your dentist you are receiving this medicine. Avoid taking products that contain aspirin, acetaminophen, ibuprofen, naproxen, or ketoprofen unless instructed by your doctor. These medicines may hide a fever. Do not become pregnant while taking this medicine. Women should inform their doctor if they wish to become pregnant or think they might be pregnant. There is a potential for serious side effects to an unborn child. Talk to your health care professional or pharmacist for more information. Do not breast-feed an infant while taking this medicine. Men are advised not to father a child while receiving this medicine. This product may contain alcohol. Ask your pharmacist or healthcare provider if this medicine contains alcohol. Be sure to tell all healthcare providers you are taking this  medicine. Certain medicines, like metronidazole and disulfiram, can cause an unpleasant reaction when taken with alcohol. The reaction includes flushing, headache, nausea, vomiting, sweating, and increased thirst. The reaction can last from 30 minutes to several hours. What side effects may I notice from receiving this medicine? Side effects that you should report to your doctor or health care professional as soon as possible: -allergic reactions like skin rash, itching or hives, swelling of the face, lips, or tongue -low blood counts - This drug may decrease the number of white blood cells, red blood cells and platelets. You may be at increased risk for infections and bleeding. -signs of infection - fever or chills, cough, sore throat, pain or difficulty passing urine -signs of decreased platelets or bleeding - bruising, pinpoint red spots on the skin, black, tarry stools, nosebleeds -signs of decreased red blood cells - unusually weak or tired, fainting spells, lightheadedness -breathing problems -chest pain -high or low blood pressure -mouth sores -nausea and vomiting -pain, swelling, redness or irritation at the injection site -pain, tingling, numbness in the hands or feet -slow or irregular heartbeat -swelling of the ankle, feet, hands Side effects that usually do not require medical attention (report to your doctor or health care professional if they continue or are bothersome): -bone pain -complete hair loss including hair on your head, underarms, pubic hair, eyebrows, and eyelashes -changes in the color of fingernails -diarrhea -loosening of the fingernails -loss of appetite -muscle or joint pain -red flush to skin -sweating This list may not describe all possible side effects. Call your doctor for medical advice   about side effects. You may report side effects to FDA at 1-800-FDA-1088. Where should I keep my medicine? This drug is given in a hospital or clinic and will not be  stored at home. NOTE: This sheet is a summary. It may not cover all possible information. If you have questions about this medicine, talk to your doctor, pharmacist, or health care provider.  2018 [employer] Standard (2015-05-03 19:58:00)  Carboplatin injection What is this medicine? CARBOPLATIN (KAR boe pla tin) is a chemotherapy drug. It targets fast dividing cells, like cancer cells, and causes these cells to die. This medicine is used to treat ovarian cancer and many other cancers. This medicine may be used for other purposes; ask your health care provider or pharmacist if you have questions. COMMON BRAND NAME(S): Paraplatin What should I tell my health care provider before I take this medicine? They need to know if you have any of these conditions: -blood disorders -hearing problems -kidney disease -recent or ongoing radiation therapy -an unusual or allergic reaction to carboplatin, cisplatin, other chemotherapy, other medicines, foods, dyes, or preservatives -pregnant or trying to get pregnant -breast-feeding How should I use this medicine? This drug is usually given as an infusion into a vein. It is administered in a hospital or clinic by a specially trained health care professional. Talk to your pediatrician regarding the use of this medicine in children. Special care may be needed. Overdosage: If you think you have taken too much of this medicine contact a poison control center or emergency room at once. NOTE: This medicine is only for you. Do not share this medicine with others. What if I miss a dose? It is important not to miss a dose. Call your doctor or health care professional if you are unable to keep an appointment. What may interact with this medicine? -medicines for seizures -medicines to increase blood counts like filgrastim, pegfilgrastim, sargramostim -some antibiotics like amikacin, gentamicin, neomycin, streptomycin, tobramycin -vaccines Talk to your doctor or  health care professional before taking any of these medicines: -acetaminophen -aspirin -ibuprofen -ketoprofen -naproxen This list may not describe all possible interactions. Give your health care provider a list of all the medicines, herbs, non-prescription drugs, or dietary supplements you use. Also tell them if you smoke, drink alcohol, or use illegal drugs. Some items may interact with your medicine. What should I watch for while using this medicine? Your condition will be monitored carefully while you are receiving this medicine. You will need important blood work done while you are taking this medicine. This drug may make you feel generally unwell. This is not uncommon, as chemotherapy can affect healthy cells as well as cancer cells. Report any side effects. Continue your course of treatment even though you feel ill unless your doctor tells you to stop. In some cases, you may be given additional medicines to help with side effects. Follow all directions for their use. Call your doctor or health care professional for advice if you get a fever, chills or sore throat, or other symptoms of a cold or flu. Do not treat yourself. This drug decreases your body's ability to fight infections. Try to avoid being around people who are sick. This medicine may increase your risk to bruise or bleed. Call your doctor or health care professional if you notice any unusual bleeding. Be careful brushing and flossing your teeth or using a toothpick because you may get an infection or bleed more easily. If you have any dental work done, tell your dentist   you are receiving this medicine. Avoid taking products that contain aspirin, acetaminophen, ibuprofen, naproxen, or ketoprofen unless instructed by your doctor. These medicines may hide a fever. Do not become pregnant while taking this medicine. Women should inform their doctor if they wish to become pregnant or think they might be pregnant. There is a potential for  serious side effects to an unborn child. Talk to your health care professional or pharmacist for more information. Do not breast-feed an infant while taking this medicine. What side effects may I notice from receiving this medicine? Side effects that you should report to your doctor or health care professional as soon as possible: -allergic reactions like skin rash, itching or hives, swelling of the face, lips, or tongue -signs of infection - fever or chills, cough, sore throat, pain or difficulty passing urine -signs of decreased platelets or bleeding - bruising, pinpoint red spots on the skin, black, tarry stools, nosebleeds -signs of decreased red blood cells - unusually weak or tired, fainting spells, lightheadedness -breathing problems -changes in hearing -changes in vision -chest pain -high blood pressure -low blood counts - This drug may decrease the number of white blood cells, red blood cells and platelets. You may be at increased risk for infections and bleeding. -nausea and vomiting -pain, swelling, redness or irritation at the injection site -pain, tingling, numbness in the hands or feet -problems with balance, talking, walking -trouble passing urine or change in the amount of urine Side effects that usually do not require medical attention (report to your doctor or health care professional if they continue or are bothersome): -hair loss -loss of appetite -metallic taste in the mouth or changes in taste This list may not describe all possible side effects. Call your doctor for medical advice about side effects. You may report side effects to FDA at 1-800-FDA-1088. Where should I keep my medicine? This drug is given in a hospital or clinic and will not be stored at home. NOTE: This sheet is a summary. It may not cover all possible information. If you have questions about this medicine, talk to your doctor, pharmacist, or health care provider.  2018 [employer]/Gold Standard  (2007-10-07 14:38:05)   

## 2017-07-30 NOTE — Progress Notes (Signed)
I saw Amy Dawson briefly in the treatment area.  She tells me she has a new mass in the right breast.  With difficulty I was able to palpate something that feels like a small been in the upper outer quadrant in the right breast rather deep.  I think this warrants imaging as above set her up for mammography and ultrasonography.  Hopefully this can be done before she sees me next week with her second cycle of chemo

## 2017-08-06 ENCOUNTER — Telehealth: Payer: Self-pay | Admitting: Oncology

## 2017-08-06 ENCOUNTER — Inpatient Hospital Stay: Payer: 59

## 2017-08-06 ENCOUNTER — Encounter: Payer: Self-pay | Admitting: *Deleted

## 2017-08-06 ENCOUNTER — Inpatient Hospital Stay (HOSPITAL_BASED_OUTPATIENT_CLINIC_OR_DEPARTMENT_OTHER): Payer: 59 | Admitting: Oncology

## 2017-08-06 VITALS — BP 131/88 | HR 78 | Temp 97.6°F | Resp 18 | Ht 68.75 in | Wt 175.5 lb

## 2017-08-06 DIAGNOSIS — Z171 Estrogen receptor negative status [ER-]: Principal | ICD-10-CM

## 2017-08-06 DIAGNOSIS — C50412 Malignant neoplasm of upper-outer quadrant of left female breast: Secondary | ICD-10-CM

## 2017-08-06 DIAGNOSIS — Z95828 Presence of other vascular implants and grafts: Secondary | ICD-10-CM

## 2017-08-06 DIAGNOSIS — Z5111 Encounter for antineoplastic chemotherapy: Secondary | ICD-10-CM | POA: Diagnosis not present

## 2017-08-06 LAB — COMPREHENSIVE METABOLIC PANEL
ALT: 19 U/L (ref 0–55)
ANION GAP: 7 (ref 3–11)
AST: 17 U/L (ref 5–34)
Albumin: 3.5 g/dL (ref 3.5–5.0)
Alkaline Phosphatase: 58 U/L (ref 40–150)
BUN: 10 mg/dL (ref 7–26)
CHLORIDE: 102 mmol/L (ref 98–109)
CO2: 27 mmol/L (ref 22–29)
CREATININE: 0.7 mg/dL (ref 0.60–1.10)
Calcium: 8.5 mg/dL (ref 8.4–10.4)
GFR calc Af Amer: >60 mL/min (ref >60)
GFR calc non Af Amer: >60 mL/min (ref >60)
Glucose, Bld: 96 mg/dL (ref 70–140)
Potassium: 3.7 mmol/L (ref 3.3–4.7)
SODIUM: 136 mmol/L (ref 136–145)
Total Bilirubin: 0.3 mg/dL (ref 0.2–1.2)
Total Protein: 6.5 g/dL (ref 6.4–8.3)

## 2017-08-06 LAB — CBC WITH DIFFERENTIAL/PLATELET
BASOS ABS: 0 10*3/uL (ref 0.0–0.1)
Basophils Relative: 1 %
EOS ABS: 0 10*3/uL (ref 0.0–0.5)
EOS PCT: 0 %
HCT: 32.3 % — ABNORMAL LOW (ref 34.8–46.6)
Hemoglobin: 10.8 g/dL — ABNORMAL LOW (ref 11.6–15.9)
LYMPHS ABS: 1.2 10*3/uL (ref 0.9–3.3)
Lymphocytes Relative: 28 %
MCH: 31.3 pg (ref 25.1–34.0)
MCHC: 33.4 g/dL (ref 31.5–36.0)
MCV: 93.6 fL (ref 79.5–101.0)
Monocytes Absolute: 0.4 10*3/uL (ref 0.1–0.9)
Monocytes Relative: 10 %
Neutro Abs: 2.6 10*3/uL (ref 1.5–6.5)
Neutrophils Relative %: 61 %
PLATELETS: 411 10*3/uL — AB (ref 145–400)
RBC: 3.45 MIL/uL — AB (ref 3.70–5.45)
RDW: 16.5 % — ABNORMAL HIGH (ref 11.2–16.1)
WBC: 4.1 10*3/uL (ref 3.9–10.3)

## 2017-08-06 MED ORDER — FAMOTIDINE IN NACL 20-0.9 MG/50ML-% IV SOLN
20.0000 mg | Freq: Once | INTRAVENOUS | Status: AC
  Administered 2017-08-06: 20 mg via INTRAVENOUS

## 2017-08-06 MED ORDER — HEPARIN SOD (PORK) LOCK FLUSH 100 UNIT/ML IV SOLN
500.0000 [IU] | Freq: Once | INTRAVENOUS | Status: AC | PRN
  Administered 2017-08-06: 500 [IU]
  Filled 2017-08-06: qty 5

## 2017-08-06 MED ORDER — SODIUM CHLORIDE 0.9% FLUSH
10.0000 mL | Freq: Once | INTRAVENOUS | Status: AC
  Administered 2017-08-06: 10 mL
  Filled 2017-08-06: qty 10

## 2017-08-06 MED ORDER — CARBOPLATIN CHEMO INJECTION 450 MG/45ML
258.2000 mg | Freq: Once | INTRAVENOUS | Status: AC
  Administered 2017-08-06: 260 mg via INTRAVENOUS
  Filled 2017-08-06: qty 26

## 2017-08-06 MED ORDER — PALONOSETRON HCL INJECTION 0.25 MG/5ML
0.2500 mg | Freq: Once | INTRAVENOUS | Status: AC
  Administered 2017-08-06: 0.25 mg via INTRAVENOUS

## 2017-08-06 MED ORDER — SODIUM CHLORIDE 0.9 % IV SOLN: 10.0000 mg | Freq: Once | INTRAVENOUS | Status: DC

## 2017-08-06 MED ORDER — SODIUM CHLORIDE 0.9 % IV SOLN
80.0000 mg/m2 | Freq: Once | INTRAVENOUS | Status: AC
  Administered 2017-08-06: 156 mg via INTRAVENOUS
  Filled 2017-08-06: qty 26

## 2017-08-06 MED ORDER — SODIUM CHLORIDE 0.9 % IV SOLN
Freq: Once | INTRAVENOUS | Status: AC
  Administered 2017-08-06: 15:00:00 via INTRAVENOUS

## 2017-08-06 MED ORDER — SODIUM CHLORIDE 0.9% FLUSH
10.0000 mL | INTRAVENOUS | Status: DC | PRN
  Administered 2017-08-06: 10 mL
  Filled 2017-08-06: qty 10

## 2017-08-06 MED ORDER — DIPHENHYDRAMINE HCL 50 MG/ML IJ SOLN
25.0000 mg | Freq: Once | INTRAMUSCULAR | Status: AC
  Administered 2017-08-06: 25 mg via INTRAVENOUS

## 2017-08-06 MED ORDER — DEXAMETHASONE SODIUM PHOSPHATE 10 MG/ML IJ SOLN
INTRAMUSCULAR | Status: AC
  Filled 2017-08-06: qty 1

## 2017-08-06 MED ORDER — FAMOTIDINE IN NACL 20-0.9 MG/50ML-% IV SOLN
INTRAVENOUS | Status: AC
  Filled 2017-08-06: qty 50

## 2017-08-06 MED ORDER — PALONOSETRON HCL INJECTION 0.25 MG/5ML
INTRAVENOUS | Status: AC
  Filled 2017-08-06: qty 5

## 2017-08-06 MED ORDER — DEXAMETHASONE SODIUM PHOSPHATE 10 MG/ML IJ SOLN
10.0000 mg | Freq: Once | INTRAMUSCULAR | Status: AC
  Administered 2017-08-06: 10 mg via INTRAVENOUS

## 2017-08-06 MED ORDER — DIPHENHYDRAMINE HCL 50 MG/ML IJ SOLN
INTRAMUSCULAR | Status: AC
  Filled 2017-08-06: qty 1

## 2017-08-06 NOTE — Telephone Encounter (Signed)
Gave patient AVs and calendar of upcoming February appointments.  °

## 2017-08-06 NOTE — Patient Instructions (Signed)
Implanted Port Home Guide An implanted port is a type of central line that is placed under the skin. Central lines are used to provide IV access when treatment or nutrition needs to be given through a person's veins. Implanted ports are used for long-term IV access. An implanted port may be placed because:  You need IV medicine that would be irritating to the small veins in your hands or arms.  You need long-term IV medicines, such as antibiotics.  You need IV nutrition for a long period.  You need frequent blood draws for lab tests.  You need dialysis.  Implanted ports are usually placed in the chest area, but they can also be placed in the upper arm, the abdomen, or the leg. An implanted port has two main parts:  Reservoir. The reservoir is round and will appear as a small, raised area under your skin. The reservoir is the part where a needle is inserted to give medicines or draw blood.  Catheter. The catheter is a thin, flexible tube that extends from the reservoir. The catheter is placed into a large vein. Medicine that is inserted into the reservoir goes into the catheter and then into the vein.  How will I care for my incision site? Do not get the incision site wet. Bathe or shower as directed by your health care provider. How is my port accessed? Special steps must be taken to access the port:  Before the port is accessed, a numbing cream can be placed on the skin. This helps numb the skin over the port site.  Your health care provider uses a sterile technique to access the port. ? Your health care provider must put on a mask and sterile gloves. ? The skin over your port is cleaned carefully with an antiseptic and allowed to dry. ? The port is gently pinched between sterile gloves, and a needle is inserted into the port.  Only "non-coring" port needles should be used to access the port. Once the port is accessed, a blood return should be checked. This helps ensure that the port  is in the vein and is not clogged.  If your port needs to remain accessed for a constant infusion, a clear (transparent) bandage will be placed over the needle site. The bandage and needle will need to be changed every week, or as directed by your health care provider.  Keep the bandage covering the needle clean and dry. Do not get it wet. Follow your health care provider's instructions on how to take a shower or bath while the port is accessed.  If your port does not need to stay accessed, no bandage is needed over the port.  What is flushing? Flushing helps keep the port from getting clogged. Follow your health care provider's instructions on how and when to flush the port. Ports are usually flushed with saline solution or a medicine called heparin. The need for flushing will depend on how the port is used.  If the port is used for intermittent medicines or blood draws, the port will need to be flushed: ? After medicines have been given. ? After blood has been drawn. ? As part of routine maintenance.  If a constant infusion is running, the port may not need to be flushed.  How long will my port stay implanted? The port can stay in for as long as your health care provider thinks it is needed. When it is time for the port to come out, surgery will be   done to remove it. The procedure is similar to the one performed when the port was put in. When should I seek immediate medical care? When you have an implanted port, you should seek immediate medical care if:  You notice a bad smell coming from the incision site.  You have swelling, redness, or drainage at the incision site.  You have more swelling or pain at the port site or the surrounding area.  You have a fever that is not controlled with medicine.  This information is not intended to replace advice given to you by your health care provider. Make sure you discuss any questions you have with your health care provider. Document  Released: 07/02/2005 Document Revised: 12/08/2015 Document Reviewed: 03/09/2013 Elsevier Interactive Patient Education  2017 Elsevier Inc.  

## 2017-08-06 NOTE — Patient Instructions (Signed)
[facility] Discharge Instructions for Patients Receiving Chemotherapy  Today you received the following chemotherapy agents:  Taxol, Carboplatin  To help prevent nausea and vomiting after your treatment, we encourage you to take your nausea medication as prescribed.   If you develop nausea and vomiting that is not controlled by your nausea medication, call the clinic.   BELOW ARE SYMPTOMS THAT SHOULD BE REPORTED IMMEDIATELY:  *FEVER GREATER THAN 100.5 F  *CHILLS WITH OR WITHOUT FEVER  NAUSEA AND VOMITING THAT IS NOT CONTROLLED WITH YOUR NAUSEA MEDICATION  *UNUSUAL SHORTNESS OF BREATH  *UNUSUAL BRUISING OR BLEEDING  TENDERNESS IN MOUTH AND THROAT WITH OR WITHOUT PRESENCE OF ULCERS  *URINARY PROBLEMS  *BOWEL PROBLEMS  UNUSUAL RASH Items with * indicate a potential emergency and should be followed up as soon as possible.  Feel free to call the clinic should you have any questions or concerns. The clinic phone number is [phone].  Please show the CHEMO ALERT CARD at check-in to the Emergency Department and triage nurse.   

## 2017-08-06 NOTE — Progress Notes (Signed)
[place]  [phone] [phone]     ID: Amy Dawson DOB: 30-Jan-1966  MR#: [id number]  EQA#:[id number]  Patient Care Team: [name], MD as PCP - General (Family Medicine) Pauletta Pickney, [name], MD as Consulting Physician (Oncology) [name], MD as Consulting Physician (General Surgery) [name], MD as Consulting Physician (Radiation Oncology) [name], NP as Nurse Practitioner (Obstetrics and Gynecology) OTHER MD:  CHIEF COMPLAINT: Triple negative breast cancer  CURRENT TREATMENT: Neoadjuvant chemotherapy   HISTORY OF CURRENT ILLNESS: From the original intake note:  Amy Dawson felt some discomfort in the left breast in June 2018, but did not think much of it.  However in October she palpated a change in the upper outer quadrant of her left breast and she brought this to medical attention.  On May 09, 2017 she had bilateral diagnostic mammography with tomography at the breast center, showing the breast density to be category capital C.  In the left breast that was not obscured mass in the upper outer quadrant, which by palpation was identified at the 1230 o'clock radiant 5 cm from the nipple.  By ultrasound this measured 2.9 cm, and it was irregular and hypoechoic.  The left axilla was sonographically benign.  Biopsy of the left breast mass in question May 14, 2017 showed [phone]) and invasive ductal carcinoma, grade 2 or more likely 3, estrogen and progesterone receptor negative, with no HER-2 amplification, the signals ratio being 1.18 and the number per cell 1.65.  The key 67 was 80%.  The patient's subsequent history is as detailed below.  INTERVAL HISTORY: Amy Dawson returns today for a follow-up and treatment of her triple negative breast cancer.  After completing 4 cycles of cyclophosphamide and doxorubicin, she was started on carboplatin and paclitaxel to be given weekly x12.  Today is day 1 cycle 2, day 8 of cycle  1.  REVIEW OF SYSTEMS: Amy Dawson is going well. She recently started the keto diet last week and reports that she has been seeing great results. She states having more energy and notes that she is back to walking four miles a day. The patient also adds that her overall demenor has improved. She has increased the amount of water she drinks and has stopped taking MiraLAX and stool softeners. Amy Dawson notes that she had three normal bowl movements last night. Stating that prior to starting the keto diet she struggled to pass her bowels. She denies unusual headaches, visual changes, nausea, vomiting, or dizziness. There has been no unusual cough, phlegm production, or pleurisy. This been no change in bowel or bladder habits. She denies unexplained fatigue or unexplained weight loss, bleeding, rash, or fever. A detailed review of systems was otherwise noncontributory.    PAST MEDICAL HISTORY: Past Medical History:  Diagnosis Date  . Allergy   . Anxiety   . Breast cancer ([place])   . Chronic kidney disease    bladder issues  . Depression   . Family history of breast cancer   . Fibromyalgia   . Genetic testing 06/13/2017   Breast/GYN panel (23 genes) @ [facility] - No pathogenic mutations detected  . Headache    smokes marijuana for migraine pain  . Murmur, cardiac    as a child  . Stroke ([place]) 2000   numbness on left side  . TMJ (dislocation of temporomandibular joint)     PAST SURGICAL HISTORY: Past Surgical History:  Procedure Laterality Date  . CESAREAN SECTION     2  .  ENDOMETRIAL ABLATION  2011  . PORTACATH PLACEMENT Right 05/31/2017   Procedure: INSERTION PORT-A-CATH WITH [place];  Surgeon: [name], MD;  Location: [facility];  Service: General;  Laterality: Right;    FAMILY HISTORY Family History  Problem Relation Age of Onset  . Liver cancer Sister        "rare type"; deceased 66  . Breast cancer Maternal Aunt        Dx 35s; currently 75s  . Breast cancer  Paternal Grandmother        Dx 68s; deceased 31s  . Breast cancer Other        mat grandfather's sister; dx 34s; deceased 70s  . Cancer Paternal Aunt        unk. type; deceased 65  . Colon cancer Neg Hx   . Esophageal cancer Neg Hx   . Rectal cancer Neg Hx   . Stomach cancer Neg Hx   The patient's parents are in their mid 52s as of November 2018.  The patient had 1 brother, 1 sister.  The sister died from a rare liver cancer at age 53.  A maternal aunt and a paternal grandmother were diagnosed with breast cancer in their 62s.  GYNECOLOGIC HISTORY:  No LMP recorded. Patient has had an ablation. Menarche age 80.  She underwent endometrial ablation 2011.  She is no longer having periods.  She never used hormone replacement.  First live birth was age 52.  She is GX P2.   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  Her husband [name] works as an Firefighter for [employer]  Daughter Sequoya Hogsett attends [employer].  Son Mallarie Voorhies is doing on apprenticeship in the [place] program.    ADVANCED DIRECTIVES: Not in place   HEALTH MAINTENANCE: Social History   Tobacco Use  . Smoking status: Former Research scientist (life sciences)  . Smokeless tobacco: Never Used  Substance Use Topics  . Alcohol use: Yes    Alcohol/week: 0.0 oz    Comment: sometimes  . Drug use: Yes    Types: Marijuana    Comment: last smoked 05-29-17     Colonoscopy: 2016/[facility]  PAP: October 2018  Bone density: Never   Allergies  Allergen Reactions  . Other Other (See Comments)    Red wine & white cheese: migraine Raw egg whites- hives    Current Outpatient Medications  Medication Sig Dispense Refill  . amitriptyline (ELAVIL) 50 MG tablet Take 25 mg by mouth at bedtime as needed for sleep.    . Aspirin-Acetaminophen-Caffeine (EXCEDRIN PO) Take by mouth.    . Calcium Glycerophosphate (PRELIEF PO) Take 3 tablets by mouth.    . Cetirizine HCl (ZYRTEC ALLERGY PO) Take by mouth  daily.    Marland Kitchen dimenhyDRINATE (DRAMAMINE) 50 MG tablet Take 50 mg every 8 (eight) hours as needed by mouth.    . fluconazole (DIFLUCAN) 150 MG tablet Take 1 tablet by mouth daily.    . fluticasone (FLONASE) 50 MCG/ACT nasal spray Place 2 sprays into both nostrils daily.    . Homeopathic Products (AZO YEAST PLUS PO) Take by mouth.    . nitrofurantoin, macrocrystal-monohydrate, (MACROBID) 100 MG capsule Take 1 capsule (100 mg total) 2 (two) times daily by mouth. 14 capsule 0  . phenazopyridine (PYRIDIUM) 200 MG tablet Take 200 mg by mouth every 8 (eight) hours as needed.    . Phenazopyridine HCl (AZO-STANDARD PO) Take by mouth.    . Pseudoephedrine HCl (  SUDAFED PO) Take by mouth as needed.    . Pumpkin Seed-Soy Germ (AZO BLADDER CONTROL/GO-LESS) CAPS Take by mouth.    . traMADol (ULTRAM) 50 MG tablet Take 2 tablets (100 mg total) every 6 (six) hours as needed by mouth. (Patient not taking: Reported on 07/02/2017) 10 tablet 0   No current facility-administered medications for this visit.     OBJECTIVE: Middle-aged white woman who appears well  Vitals:   08/06/17 1323  BP: 131/88  Pulse: 78  Resp: 18  Temp: 97.6 F (36.4 C)  SpO2: 99%     Body mass index is 26.11 kg/m.   Wt Readings from Last 3 Encounters:  08/06/17 175 lb 8 oz (79.6 kg)  07/30/17 177 lb 12 oz (80.6 kg)  07/24/17 176 lb 14.4 oz (80.2 kg)     ECOG FS:0 - Asymptomatic  Sclerae unicteric, pupils round and equal Oropharynx clear and moist No cervical or supraclavicular adenopathy Lungs no rales or rhonchi Heart regular rate and rhythm Abd soft, nontender, positive bowel sounds MSK no focal spinal tenderness, no upper extremity lymphedema Neuro: nonfocal, well oriented, appropriate affect Breasts: The right breast is unremarkable.  There is a cyst there which we had previously managed by ultrasound and MRI.  The left breast still shows a mass in the upper slightly outer quadrant.  It is minimally decreased.  It is  movable but still fairly firm.  There is no overlying erythema.  Both axillae are benign.  LAB RESULTS:  CMP     Component Value Date/Time   NA 136 08/06/2017 1216   NA 138 07/10/2017 1458   K 3.7 08/06/2017 1216   K 3.7 07/10/2017 1458   CL 102 08/06/2017 1216   CO2 27 08/06/2017 1216   CO2 25 07/10/2017 1458   GLUCOSE 96 08/06/2017 1216   GLUCOSE 95 07/10/2017 1458   BUN 10 08/06/2017 1216   BUN 7.4 07/10/2017 1458   CREATININE 0.70 08/06/2017 1216   CREATININE 0.7 07/10/2017 1458   CALCIUM 8.5 08/06/2017 1216   CALCIUM 8.9 07/10/2017 1458   PROT 6.5 08/06/2017 1216   PROT 7.1 07/10/2017 1458   ALBUMIN 3.5 08/06/2017 1216   ALBUMIN 3.8 07/10/2017 1458   AST 17 08/06/2017 1216   AST 10 07/10/2017 1458   ALT 19 08/06/2017 1216   ALT 11 07/10/2017 1458   ALKPHOS 58 08/06/2017 1216   ALKPHOS 92 07/10/2017 1458   BILITOT 0.3 08/06/2017 1216   BILITOT <0.22 07/10/2017 1458   GFRNONAA >60 08/06/2017 1216   GFRAA >60 08/06/2017 1216    No results found for: TOTALPROTELP, ALBUMINELP, A1GS, A2GS, BETS, BETA2SER, GAMS, MSPIKE, SPEI  No results found for: KPAFRELGTCHN, LAMBDASER, [facility]  Lab Results  Component Value Date   WBC 4.1 08/06/2017   NEUTROABS 2.6 08/06/2017   HGB 10.8 (L) 08/06/2017   HCT 32.3 (L) 08/06/2017   MCV 93.6 08/06/2017   PLT 411 (H) 08/06/2017      Chemistry      Component Value Date/Time   NA 136 08/06/2017 1216   NA 138 07/10/2017 1458   K 3.7 08/06/2017 1216   K 3.7 07/10/2017 1458   CL 102 08/06/2017 1216   CO2 27 08/06/2017 1216   CO2 25 07/10/2017 1458   BUN 10 08/06/2017 1216   BUN 7.4 07/10/2017 1458   CREATININE 0.70 08/06/2017 1216   CREATININE 0.7 07/10/2017 1458      Component Value Date/Time   CALCIUM 8.5 08/06/2017 1216  CALCIUM 8.9 07/10/2017 1458   ALKPHOS 58 08/06/2017 1216   ALKPHOS 92 07/10/2017 1458   AST 17 08/06/2017 1216   AST 10 07/10/2017 1458   ALT 19 08/06/2017 1216   ALT 11 07/10/2017 1458    BILITOT 0.3 08/06/2017 1216   BILITOT <0.22 07/10/2017 1458       No results found for: LABCA2  No components found for: HBZJIR678  No results for input(s): INR in the last 168 hours.  No results found for: LABCA2  No results found for: LFY101  No results found for: BPZ025  No results found for: ENI778  No results found for: CA2729  No components found for: HGQUANT  No results found for: CEA1 / No results found for: CEA1   No results found for: AFPTUMOR  No results found for: [place]  No results found for: PSA1  Appointment on 08/06/2017  Component Date Value Ref Range Status  . WBC 08/06/2017 4.1  3.9 - 10.3 K/uL Final  . RBC 08/06/2017 3.45* 3.70 - 5.45 MIL/uL Final  . Hemoglobin 08/06/2017 10.8* 11.6 - 15.9 g/dL Final  . HCT 08/06/2017 32.3* 34.8 - 46.6 % Final  . MCV 08/06/2017 93.6  79.5 - 101.0 fL Final  . MCH 08/06/2017 31.3  25.1 - 34.0 pg Final  . MCHC 08/06/2017 33.4  31.5 - 36.0 g/dL Final  . RDW 08/06/2017 16.5* 11.2 - 16.1 % Final  . Platelets 08/06/2017 411* 145 - 400 K/uL Final  . Neutrophils Relative % 08/06/2017 61  % Final  . Neutro Abs 08/06/2017 2.6  1.5 - 6.5 K/uL Final  . Lymphocytes Relative 08/06/2017 28  % Final  . Lymphs Abs 08/06/2017 1.2  0.9 - 3.3 K/uL Final  . Monocytes Relative 08/06/2017 10  % Final  . Monocytes Absolute 08/06/2017 0.4  0.1 - 0.9 K/uL Final  . Eosinophils Relative 08/06/2017 0  % Final  . Eosinophils Absolute 08/06/2017 0.0  0.0 - 0.5 K/uL Final  . Basophils Relative 08/06/2017 1  % Final  . Basophils Absolute 08/06/2017 0.0  0.0 - 0.1 K/uL Final   Performed at [facility], [place] [address], [place], [place] [address]  . Sodium 08/06/2017 136  136 - 145 mmol/L Final  . Potassium 08/06/2017 3.7  3.3 - 4.7 mmol/L Final  . Chloride 08/06/2017 102  98 - 109 mmol/L Final  . CO2 08/06/2017 27  22 - 29 mmol/L Final  . Glucose, Bld 08/06/2017 96  70 - 140 mg/dL Final  . BUN 08/06/2017 10  7  - 26 mg/dL Final  . Creatinine, Ser 08/06/2017 0.70  0.60 - 1.10 mg/dL Final  . Calcium 08/06/2017 8.5  8.4 - 10.4 mg/dL Final  . Total Protein 08/06/2017 6.5  6.4 - 8.3 g/dL Final  . Albumin 08/06/2017 3.5  3.5 - 5.0 g/dL Final  . AST 08/06/2017 17  5 - 34 U/L Final  . ALT 08/06/2017 19  0 - 55 U/L Final  . Alkaline Phosphatase 08/06/2017 58  40 - 150 U/L Final  . Total Bilirubin 08/06/2017 0.3  0.2 - 1.2 mg/dL Final  . GFR calc non Af Amer 08/06/2017 >60  >60 mL/min Final  . GFR calc Af Amer 08/06/2017 >60  >60 mL/min Final   Comment: (NOTE) The eGFR has been calculated using the CKD EPI equation. This calculation has not been validated in all clinical situations. eGFR's persistently <60 mL/min signify possible Chronic Kidney Disease.   . Anion gap 08/06/2017 7  3 - 11 Final   Performed at [facility], [address] [place], [place] [address]    (this displays the last labs from the last 3 days)  No results found for: TOTALPROTELP, ALBUMINELP, A1GS, A2GS, BETS, BETA2SER, GAMS, MSPIKE, SPEI (this displays SPEP labs)  No results found for: KPAFRELGTCHN, LAMBDASER, KAPLAMBRATIO (kappa/lambda light chains)  No results found for: HGBA, HGBA2QUANT, HGBFQUANT, HGBSQUAN (Hemoglobinopathy evaluation)   No results found for: LDH  No results found for: IRON, TIBC, IRONPCTSAT (Iron and TIBC)  No results found for: FERRITIN  Urinalysis    Component Value Date/Time   COLORURINE YELLOW 07/07/2017 1400   APPEARANCEUR CLEAR 07/07/2017 1400   LABSPEC 1.003 (L) 07/07/2017 1400   LABSPEC 1.010 05/30/2017 1315   PHURINE 7.0 07/07/2017 1400   GLUCOSEU NEGATIVE 07/07/2017 1400   GLUCOSEU Negative 05/30/2017 1315   HGBUR NEGATIVE 07/07/2017 1400   BILIRUBINUR NEGATIVE 07/07/2017 1400   BILIRUBINUR Color Interference 05/30/2017 1315   KETONESUR NEGATIVE 07/07/2017 1400   PROTEINUR NEGATIVE 07/07/2017 1400   UROBILINOGEN Color Interference 05/30/2017 1315    NITRITE NEGATIVE 07/07/2017 1400   LEUKOCYTESUR NEGATIVE 07/07/2017 1400   LEUKOCYTESUR Color Interference 05/30/2017 1315     STUDIES: No results found.   ELIGIBLE FOR AVAILABLE RESEARCH PROTOCOL: SWOG [id number] depending on response to neoadjuvant treatment, UPBEAT  ASSESSMENT: 52 y.o. [place] status post left breast upper outer quadrant biopsy May 14, 2017 for a clinical T2 N0, stage IIB invasive ductal carcinoma, grade 3, triple negative, with an MIB-1 of 80%  (1) neoadjuvant chemotherapy will consist of doxorubicin and cyclophosphamide in dose dense fashion x4 starting 06/04/2017, completed 07/17/2017; followed by weekly carboplatin and paclitaxel x12 started 07/30/2017  (a) echo on 05/30/2017: LVEF 55-60%  (2) breast conserving surgery with sentinel lymph node sampling to follow  (3) adjuvant radiation to follow surgery  (4) genetics testing pending  PLAN: Verlia tolerated her first cycle of carboplatin/paclitaxel remarkably well.  She had very minimal side effects.  I do not think she needs to take the full anti-nausea set of medications that she did for CA and we are going to be dropping that today.  I will give her a separate "roadmap" with the details.  I cautioned her again regarding the problem of peripheral neuropathy.  We reviewed the symptoms and she knows if she does develop any of those symptoms she will let us know immediately, before she gets treated, so even if she does not have an appointment with me that day I can see her in the treatment area.  The plan is to continue to a total of 12 cycles.  I will see her every other cycle until she reaches the 6 cycle and after that we will decide whether I need to see her every single treatment.  Also at this point we have not built in any breaks for her.  Were going to let her bone marrow tell us if we need to do that  She is very excited about her new diet and it certainly seems to be working for her at least from a  psychological point of view.  She knows to call for any problems that may develop before the next visit.  Verlena Marlette, [name], MD  08/06/17 1:43 PM Medical Oncology and Hematology  [facility] [address] [place], [place] [address] Tel. [phone]    Fax. [phone]  This document serves as a record of services personally performed by [name], Ellard Nan,  MD. It was created on his behalf by Margit Banda, a trained medical scribe. The creation of this record is based on the scribe's personal observations and the provider's statements to them.   I have reviewed the above documentation for accuracy and completeness, and I agree with the above.

## 2017-08-13 ENCOUNTER — Encounter: Payer: Self-pay | Admitting: *Deleted

## 2017-08-13 ENCOUNTER — Inpatient Hospital Stay: Payer: 59

## 2017-08-13 VITALS — BP 130/81 | HR 71 | Temp 98.3°F | Resp 18

## 2017-08-13 DIAGNOSIS — Z171 Estrogen receptor negative status [ER-]: Principal | ICD-10-CM

## 2017-08-13 DIAGNOSIS — Z5111 Encounter for antineoplastic chemotherapy: Secondary | ICD-10-CM | POA: Diagnosis not present

## 2017-08-13 DIAGNOSIS — C50412 Malignant neoplasm of upper-outer quadrant of left female breast: Secondary | ICD-10-CM

## 2017-08-13 LAB — CBC WITH DIFFERENTIAL/PLATELET
BASOS ABS: 0.1 10*3/uL (ref 0.0–0.1)
Basophils Relative: 3 %
EOS ABS: 0 10*3/uL (ref 0.0–0.5)
EOS PCT: 0 %
HCT: 33.3 % — ABNORMAL LOW (ref 34.8–46.6)
Hemoglobin: 11.2 g/dL — ABNORMAL LOW (ref 11.6–15.9)
LYMPHS ABS: 0.8 10*3/uL — AB (ref 0.9–3.3)
Lymphocytes Relative: 27 %
MCH: 32.1 pg (ref 25.1–34.0)
MCHC: 33.5 g/dL (ref 31.5–36.0)
MCV: 95.8 fL (ref 79.5–101.0)
Monocytes Absolute: 0.5 10*3/uL (ref 0.1–0.9)
Monocytes Relative: 16 %
Neutro Abs: 1.5 10*3/uL (ref 1.5–6.5)
Neutrophils Relative %: 54 %
PLATELETS: 301 10*3/uL (ref 145–400)
RBC: 3.48 MIL/uL — AB (ref 3.70–5.45)
RDW: 18.6 % — ABNORMAL HIGH (ref 11.2–16.1)
WBC: 2.8 10*3/uL — AB (ref 3.9–10.3)

## 2017-08-13 MED ORDER — DIPHENHYDRAMINE HCL 50 MG/ML IJ SOLN
INTRAMUSCULAR | Status: AC
  Filled 2017-08-13: qty 1

## 2017-08-13 MED ORDER — DEXAMETHASONE SODIUM PHOSPHATE 10 MG/ML IJ SOLN
10.0000 mg | Freq: Once | INTRAMUSCULAR | Status: AC
  Administered 2017-08-13: 10 mg via INTRAVENOUS

## 2017-08-13 MED ORDER — PALONOSETRON HCL INJECTION 0.25 MG/5ML
INTRAVENOUS | Status: AC
Start: 2017-08-13 — End: 2017-08-13
  Filled 2017-08-13: qty 5

## 2017-08-13 MED ORDER — DEXAMETHASONE SODIUM PHOSPHATE 100 MG/10ML IJ SOLN: 10.0000 mg | Freq: Once | INTRAMUSCULAR | Status: DC

## 2017-08-13 MED ORDER — CARBOPLATIN CHEMO INJECTION 450 MG/45ML
258.2000 mg | Freq: Once | INTRAVENOUS | Status: AC
  Administered 2017-08-13: 260 mg via INTRAVENOUS
  Filled 2017-08-13: qty 26

## 2017-08-13 MED ORDER — HEPARIN SOD (PORK) LOCK FLUSH 100 UNIT/ML IV SOLN
500.0000 [IU] | Freq: Once | INTRAVENOUS | Status: AC | PRN
  Administered 2017-08-13: 500 [IU]
  Filled 2017-08-13: qty 5

## 2017-08-13 MED ORDER — SODIUM CHLORIDE 0.9 % IV SOLN
80.0000 mg/m2 | Freq: Once | INTRAVENOUS | Status: AC
  Administered 2017-08-13: 156 mg via INTRAVENOUS
  Filled 2017-08-13: qty 26

## 2017-08-13 MED ORDER — DIPHENHYDRAMINE HCL 50 MG/ML IJ SOLN
25.0000 mg | Freq: Once | INTRAMUSCULAR | Status: AC
  Administered 2017-08-13: 25 mg via INTRAVENOUS

## 2017-08-13 MED ORDER — SODIUM CHLORIDE 0.9 % IV SOLN
Freq: Once | INTRAVENOUS | Status: AC
  Administered 2017-08-13: 11:00:00 via INTRAVENOUS

## 2017-08-13 MED ORDER — DEXAMETHASONE SODIUM PHOSPHATE 10 MG/ML IJ SOLN
INTRAMUSCULAR | Status: AC
  Filled 2017-08-13: qty 1

## 2017-08-13 MED ORDER — SODIUM CHLORIDE 0.9% FLUSH
10.0000 mL | INTRAVENOUS | Status: DC | PRN
  Administered 2017-08-13: 10 mL
  Filled 2017-08-13: qty 10

## 2017-08-13 MED ORDER — FAMOTIDINE IN NACL 20-0.9 MG/50ML-% IV SOLN
INTRAVENOUS | Status: AC
  Filled 2017-08-13: qty 50

## 2017-08-13 MED ORDER — SODIUM CHLORIDE 0.9 % IV SOLN: 10.0000 mg | Freq: Once | INTRAVENOUS | Status: DC

## 2017-08-13 MED ORDER — FAMOTIDINE IN NACL 20-0.9 MG/50ML-% IV SOLN
20.0000 mg | Freq: Once | INTRAVENOUS | Status: AC
  Administered 2017-08-13: 20 mg via INTRAVENOUS

## 2017-08-13 MED ORDER — PALONOSETRON HCL INJECTION 0.25 MG/5ML
0.2500 mg | Freq: Once | INTRAVENOUS | Status: AC
  Administered 2017-08-13: 0.25 mg via INTRAVENOUS

## 2017-08-13 NOTE — Patient Instructions (Signed)
[facility] Discharge Instructions for Patients Receiving Chemotherapy  Today you received the following chemotherapy agents Taxol and Carboplatin.   To help prevent nausea and vomiting after your treatment, we encourage you to take your nausea medication as prescribed.    If you develop nausea and vomiting that is not controlled by your nausea medication, call the clinic.   BELOW ARE SYMPTOMS THAT SHOULD BE REPORTED IMMEDIATELY:  *FEVER GREATER THAN 100.5 F  *CHILLS WITH OR WITHOUT FEVER  NAUSEA AND VOMITING THAT IS NOT CONTROLLED WITH YOUR NAUSEA MEDICATION  *UNUSUAL SHORTNESS OF BREATH  *UNUSUAL BRUISING OR BLEEDING  TENDERNESS IN MOUTH AND THROAT WITH OR WITHOUT PRESENCE OF ULCERS  *URINARY PROBLEMS  *BOWEL PROBLEMS  UNUSUAL RASH Items with * indicate a potential emergency and should be followed up as soon as possible.  Feel free to call the clinic should you have any questions or concerns. The clinic phone number is [phone].  Please show the CHEMO ALERT CARD at check-in to the Emergency Department and triage nurse.   

## 2017-08-13 NOTE — Progress Notes (Signed)
Okay to treat with CMP from 1/22 per Val, per Dr. [name].

## 2017-08-20 ENCOUNTER — Other Ambulatory Visit: Payer: 59

## 2017-08-20 ENCOUNTER — Inpatient Hospital Stay: Payer: 59

## 2017-08-20 ENCOUNTER — Inpatient Hospital Stay (HOSPITAL_BASED_OUTPATIENT_CLINIC_OR_DEPARTMENT_OTHER): Payer: 59 | Admitting: Oncology

## 2017-08-20 ENCOUNTER — Inpatient Hospital Stay: Payer: 59 | Attending: Oncology

## 2017-08-20 VITALS — BP 112/72 | HR 107 | Temp 98.4°F | Resp 18 | Ht 68.75 in | Wt 173.5 lb

## 2017-08-20 VITALS — HR 93

## 2017-08-20 DIAGNOSIS — Z923 Personal history of irradiation: Secondary | ICD-10-CM | POA: Insufficient documentation

## 2017-08-20 DIAGNOSIS — I619 Nontraumatic intracerebral hemorrhage, unspecified: Secondary | ICD-10-CM

## 2017-08-20 DIAGNOSIS — Z87891 Personal history of nicotine dependence: Secondary | ICD-10-CM

## 2017-08-20 DIAGNOSIS — Z808 Family history of malignant neoplasm of other organs or systems: Secondary | ICD-10-CM

## 2017-08-20 DIAGNOSIS — C50412 Malignant neoplasm of upper-outer quadrant of left female breast: Secondary | ICD-10-CM | POA: Diagnosis not present

## 2017-08-20 DIAGNOSIS — Z171 Estrogen receptor negative status [ER-]: Secondary | ICD-10-CM | POA: Diagnosis not present

## 2017-08-20 DIAGNOSIS — Z5111 Encounter for antineoplastic chemotherapy: Secondary | ICD-10-CM | POA: Diagnosis present

## 2017-08-20 DIAGNOSIS — Z803 Family history of malignant neoplasm of breast: Secondary | ICD-10-CM | POA: Insufficient documentation

## 2017-08-20 DIAGNOSIS — Z95828 Presence of other vascular implants and grafts: Secondary | ICD-10-CM

## 2017-08-20 DIAGNOSIS — Z5189 Encounter for other specified aftercare: Secondary | ICD-10-CM | POA: Insufficient documentation

## 2017-08-20 LAB — CBC WITH DIFFERENTIAL/PLATELET
BASOS PCT: 3 %
Basophils Absolute: 0.1 10*3/uL (ref 0.0–0.1)
EOS ABS: 0 10*3/uL (ref 0.0–0.5)
EOS PCT: 1 %
HEMATOCRIT: 35.8 % (ref 34.8–46.6)
Hemoglobin: 11.9 g/dL (ref 11.6–15.9)
Lymphocytes Relative: 26 %
Lymphs Abs: 0.7 10*3/uL — ABNORMAL LOW (ref 0.9–3.3)
MCH: 32.2 pg (ref 25.1–34.0)
MCHC: 33.1 g/dL (ref 31.5–36.0)
MCV: 97.2 fL (ref 79.5–101.0)
MONO ABS: 0.3 10*3/uL (ref 0.1–0.9)
MONOS PCT: 13 %
NEUTROS ABS: 1.5 10*3/uL (ref 1.5–6.5)
Neutrophils Relative %: 57 %
PLATELETS: 214 10*3/uL (ref 145–400)
RBC: 3.69 MIL/uL — ABNORMAL LOW (ref 3.70–5.45)
RDW: 18.8 % — AB (ref 11.2–14.5)
WBC: 2.7 10*3/uL — ABNORMAL LOW (ref 3.9–10.3)

## 2017-08-20 LAB — COMPREHENSIVE METABOLIC PANEL
ALBUMIN: 3.8 g/dL (ref 3.5–5.0)
ALK PHOS: 56 U/L (ref 40–150)
ALT: 54 U/L (ref 0–55)
ANION GAP: 9 (ref 3–11)
AST: 30 U/L (ref 5–34)
BILIRUBIN TOTAL: 0.3 mg/dL (ref 0.2–1.2)
BUN: 8 mg/dL (ref 7–26)
CALCIUM: 8.8 mg/dL (ref 8.4–10.4)
CO2: 24 mmol/L (ref 22–29)
CREATININE: 0.7 mg/dL (ref 0.60–1.10)
Chloride: 103 mmol/L (ref 98–109)
GFR calc Af Amer: >60 mL/min (ref >60)
GFR calc non Af Amer: >60 mL/min (ref >60)
GLUCOSE: 103 mg/dL (ref 70–140)
Potassium: 3.7 mmol/L (ref 3.5–5.1)
Sodium: 136 mmol/L (ref 136–145)
TOTAL PROTEIN: 6.7 g/dL (ref 6.4–8.3)

## 2017-08-20 MED ORDER — SODIUM CHLORIDE 0.9 % IV SOLN
258.2000 mg | Freq: Once | INTRAVENOUS | Status: AC
  Administered 2017-08-20: 260 mg via INTRAVENOUS
  Filled 2017-08-20: qty 26

## 2017-08-20 MED ORDER — SODIUM CHLORIDE 0.9 % IV SOLN: 10.0000 mg | Freq: Once | INTRAVENOUS | Status: DC

## 2017-08-20 MED ORDER — FAMOTIDINE IN NACL 20-0.9 MG/50ML-% IV SOLN
INTRAVENOUS | Status: AC
  Filled 2017-08-20: qty 50

## 2017-08-20 MED ORDER — HEPARIN SOD (PORK) LOCK FLUSH 100 UNIT/ML IV SOLN
500.0000 [IU] | Freq: Once | INTRAVENOUS | Status: AC | PRN
  Administered 2017-08-20: 500 [IU]
  Filled 2017-08-20: qty 5

## 2017-08-20 MED ORDER — SODIUM CHLORIDE 0.9 % IV SOLN
Freq: Once | INTRAVENOUS | Status: AC
Start: 2017-08-20 — End: 2017-08-20
  Administered 2017-08-20: 14:00:00 via INTRAVENOUS

## 2017-08-20 MED ORDER — PALONOSETRON HCL INJECTION 0.25 MG/5ML
INTRAVENOUS | Status: AC
  Filled 2017-08-20: qty 5

## 2017-08-20 MED ORDER — LORAZEPAM 0.5 MG PO TABS: 0.5000 mg | ORAL_TABLET | Freq: Every evening | ORAL | 0 refills | Status: DC | PRN

## 2017-08-20 MED ORDER — SODIUM CHLORIDE 0.9% FLUSH
10.0000 mL | INTRAVENOUS | Status: DC | PRN
  Administered 2017-08-20: 10 mL
  Filled 2017-08-20: qty 10

## 2017-08-20 MED ORDER — SODIUM CHLORIDE 0.9% FLUSH
10.0000 mL | Freq: Once | INTRAVENOUS | Status: AC
  Administered 2017-08-20: 10 mL
  Filled 2017-08-20: qty 10

## 2017-08-20 MED ORDER — PACLITAXEL CHEMO INJECTION 300 MG/50ML
80.0000 mg/m2 | Freq: Once | INTRAVENOUS | Status: AC
  Administered 2017-08-20: 156 mg via INTRAVENOUS
  Filled 2017-08-20: qty 26

## 2017-08-20 MED ORDER — FAMOTIDINE IN NACL 20-0.9 MG/50ML-% IV SOLN
20.0000 mg | Freq: Once | INTRAVENOUS | Status: AC
  Administered 2017-08-20: 20 mg via INTRAVENOUS

## 2017-08-20 MED ORDER — DIPHENHYDRAMINE HCL 50 MG/ML IJ SOLN
INTRAMUSCULAR | Status: AC
  Filled 2017-08-20: qty 1

## 2017-08-20 MED ORDER — DEXAMETHASONE SODIUM PHOSPHATE 10 MG/ML IJ SOLN
INTRAMUSCULAR | Status: AC
  Filled 2017-08-20: qty 1

## 2017-08-20 MED ORDER — DEXAMETHASONE SODIUM PHOSPHATE 10 MG/ML IJ SOLN
10.0000 mg | Freq: Once | INTRAMUSCULAR | Status: AC
  Administered 2017-08-20: 10 mg via INTRAVENOUS

## 2017-08-20 MED ORDER — DIPHENHYDRAMINE HCL 50 MG/ML IJ SOLN
25.0000 mg | Freq: Once | INTRAMUSCULAR | Status: AC
  Administered 2017-08-20: 25 mg via INTRAVENOUS

## 2017-08-20 MED ORDER — PALONOSETRON HCL INJECTION 0.25 MG/5ML
0.2500 mg | Freq: Once | INTRAVENOUS | Status: AC
  Administered 2017-08-20: 0.25 mg via INTRAVENOUS

## 2017-08-20 NOTE — Progress Notes (Signed)
[place]  [phone] [phone]     ID: ARANTXA PIERCEY DOB: 1965/07/27  MR#: [id number]  GQQ#:[id number]  Patient Care Team: [name], MD as PCP - General (Family Medicine) Susie Ehresman, [name], MD as Consulting Physician (Oncology) [name], MD as Consulting Physician (General Surgery) [name], MD as Consulting Physician (Radiation Oncology) [name], NP as Nurse Practitioner (Obstetrics and Gynecology) OTHER MD:  CHIEF COMPLAINT: Triple negative breast cancer  CURRENT TREATMENT: Neoadjuvant chemotherapy   HISTORY OF CURRENT ILLNESS: From the original intake note:  [name] felt some discomfort in the left breast in June 2018, but did not think much of it.  However in October she palpated a change in the upper outer quadrant of her left breast and she brought this to medical attention.  On May 09, 2017 she had bilateral diagnostic mammography with tomography at the breast center, showing the breast density to be category capital C.  In the left breast that was not obscured mass in the upper outer quadrant, which by palpation was identified at the 1230 o'clock radiant 5 cm from the nipple.  By ultrasound this measured 2.9 cm, and it was irregular and hypoechoic.  The left axilla was sonographically benign.  Biopsy of the left breast mass in question May 14, 2017 showed [phone]) and invasive ductal carcinoma, grade 2 or more likely 3, estrogen and progesterone receptor negative, with no HER-2 amplification, the signals ratio being 1.18 and the number per cell 1.65.  The key 67 was 80%.  The patient's subsequent history is as detailed below.  INTERVAL HISTORY: Merian returns today for follow-up and treatment of her triple negative breast cancer.  She completed 4 cycles of cyclophosphamide and doxorubicin and is currently receiving carboplatin and paclitaxel weekly, with 12 doses planned.  Today is day 1 cycle 4. For  cycle 2, she didn't tolerate it as well, so she changed some medications around for her next cycle. She notes that for cycle 3, she was taking decadron 4 MG BID x 2 days and compazine PRN. She denied vomiting and had some mild nausea that is tolerable.     REVIEW OF SYSTEMS: Murray reports that for exercise, she is walking at minimum 2 miles a day. She denies mouth sores. She has the same change in taste. She denies numbness or tingling in her extremities. She reports that she had has some "funny sensation" to her fingernails. She denies unusual headaches, visual changes, nausea, vomiting, or dizziness. There has been no unusual cough, phlegm production, or pleurisy. This been no change in bowel or bladder habits. She denies unexplained fatigue or unexplained weight loss, bleeding, rash, or fever. A detailed review of systems was otherwise stable.     PAST MEDICAL HISTORY: Past Medical History:  Diagnosis Date  . Allergy   . Anxiety   . Breast cancer ([place])   . Chronic kidney disease    bladder issues  . Depression   . Family history of breast cancer   . Fibromyalgia   . Genetic testing 06/13/2017   Breast/GYN panel (23 genes) @ [facility] - No pathogenic mutations detected  . Headache    smokes marijuana for migraine pain  . Murmur, cardiac    as a child  . Stroke ([place]) 2000   numbness on left side  . TMJ (dislocation of temporomandibular joint)     PAST SURGICAL HISTORY: Past Surgical History:  Procedure Laterality Date  . CESAREAN SECTION     2  .  ENDOMETRIAL ABLATION  2011  . PORTACATH PLACEMENT Right 05/31/2017   Procedure: INSERTION PORT-A-CATH WITH [place];  Surgeon: [name], MD;  Location: [facility];  Service: General;  Laterality: Right;    FAMILY HISTORY Family History  Problem Relation Age of Onset  . Liver cancer Sister        "rare type"; deceased 74  . Breast cancer Maternal Aunt        Dx 35s; currently 2s  . Breast cancer Paternal  Grandmother        Dx 42s; deceased 4s  . Breast cancer Other        mat grandfather's sister; dx 33s; deceased 59s  . Cancer Paternal Aunt        unk. type; deceased 53  . Colon cancer Neg Hx   . Esophageal cancer Neg Hx   . Rectal cancer Neg Hx   . Stomach cancer Neg Hx   The patient's parents are in their mid 61s as of November 2018.  The patient had 1 brother, 1 sister.  The sister died from a rare liver cancer at age 66.  A maternal aunt and a paternal grandmother were diagnosed with breast cancer in their 58s.  GYNECOLOGIC HISTORY:  No LMP recorded. Patient has had an ablation. Menarche age 73.  She underwent endometrial ablation 2011.  She is no longer having periods.  She never used hormone replacement.  First live birth was age 53.  She is GX P2.   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  Her husband [name] works as an Firefighter for [employer]  Daughter Tamantha Saline attends [employer].  Son Idonna Heeren is doing on apprenticeship in the [employer] program.    ADVANCED DIRECTIVES: Not in place   HEALTH MAINTENANCE: Social History   Tobacco Use  . Smoking status: Former Research scientist (life sciences)  . Smokeless tobacco: Never Used  Substance Use Topics  . Alcohol use: Yes    Alcohol/week: 0.0 oz    Comment: sometimes  . Drug use: Yes    Types: Marijuana    Comment: last smoked 05-29-17     Colonoscopy: 2016/[facility]  PAP: October 2018  Bone density: Never   Allergies  Allergen Reactions  . Other Other (See Comments)    Red wine & white cheese: migraine Raw egg whites- hives    Current Outpatient Medications  Medication Sig Dispense Refill  . amitriptyline (ELAVIL) 50 MG tablet Take 25 mg by mouth at bedtime as needed for sleep.    . Aspirin-Acetaminophen-Caffeine (EXCEDRIN PO) Take by mouth.    . Calcium Glycerophosphate (PRELIEF PO) Take 3 tablets by mouth.    . Cetirizine HCl (ZYRTEC ALLERGY PO) Take by mouth daily.      Marland Kitchen dimenhyDRINATE (DRAMAMINE) 50 MG tablet Take 50 mg every 8 (eight) hours as needed by mouth.    . fluconazole (DIFLUCAN) 150 MG tablet Take 1 tablet by mouth daily.    . fluticasone (FLONASE) 50 MCG/ACT nasal spray Place 2 sprays into both nostrils daily.    . Homeopathic Products (AZO YEAST PLUS PO) Take by mouth.    . nitrofurantoin, macrocrystal-monohydrate, (MACROBID) 100 MG capsule Take 1 capsule (100 mg total) 2 (two) times daily by mouth. 14 capsule 0  . phenazopyridine (PYRIDIUM) 200 MG tablet Take 200 mg by mouth every 8 (eight) hours as needed.    . Phenazopyridine HCl (AZO-STANDARD PO) Take by mouth.    . Pseudoephedrine  HCl (SUDAFED PO) Take by mouth as needed.    . Pumpkin Seed-Soy Germ (AZO BLADDER CONTROL/GO-LESS) CAPS Take by mouth.    . traMADol (ULTRAM) 50 MG tablet Take 2 tablets (100 mg total) every 6 (six) hours as needed by mouth. (Patient not taking: Reported on 07/02/2017) 10 tablet 0   No current facility-administered medications for this visit.     OBJECTIVE: Middle-aged white woman in no acute distress  Vitals:   08/20/17 1207  BP: 112/72  Pulse: (!) 107  Resp: 18  Temp: 98.4 F (36.9 C)  SpO2: 98%     Body mass index is 25.81 kg/m.   Wt Readings from Last 3 Encounters:  08/20/17 173 lb 8 oz (78.7 kg)  08/06/17 175 lb 8 oz (79.6 kg)  07/30/17 177 lb 12 oz (80.6 kg)     ECOG FS:0 - Asymptomatic  Sclerae unicteric, EOMs intact Oropharynx clear and moist No cervical or supraclavicular adenopathy Lungs no rales or rhonchi Heart regular rate and rhythm Abd soft, nontender, positive bowel sounds MSK no focal spinal tenderness, no upper extremity lymphedema Neuro: nonfocal, well oriented, appropriate affect Breasts: The mass in the left breast appears essentially unchanged to me, which of course is a concern.  Both axillae are benign  LAB RESULTS:  CMP     Component Value Date/Time   NA 136 08/20/2017 1137   NA 138 07/10/2017 1458   K 3.7  08/20/2017 1137   K 3.7 07/10/2017 1458   CL 103 08/20/2017 1137   CO2 24 08/20/2017 1137   CO2 25 07/10/2017 1458   GLUCOSE 103 08/20/2017 1137   GLUCOSE 95 07/10/2017 1458   BUN 8 08/20/2017 1137   BUN 7.4 07/10/2017 1458   CREATININE 0.70 08/20/2017 1137   CREATININE 0.7 07/10/2017 1458   CALCIUM 8.8 08/20/2017 1137   CALCIUM 8.9 07/10/2017 1458   PROT 6.7 08/20/2017 1137   PROT 7.1 07/10/2017 1458   ALBUMIN 3.8 08/20/2017 1137   ALBUMIN 3.8 07/10/2017 1458   AST 30 08/20/2017 1137   AST 10 07/10/2017 1458   ALT 54 08/20/2017 1137   ALT 11 07/10/2017 1458   ALKPHOS 56 08/20/2017 1137   ALKPHOS 92 07/10/2017 1458   BILITOT 0.3 08/20/2017 1137   BILITOT <0.22 07/10/2017 1458   GFRNONAA >60 08/20/2017 1137   GFRAA >60 08/20/2017 1137    No results found for: TOTALPROTELP, ALBUMINELP, A1GS, A2GS, BETS, BETA2SER, GAMS, MSPIKE, SPEI  No results found for: Nils Pyle, [facility]  Lab Results  Component Value Date   WBC 2.7 (L) 08/20/2017   NEUTROABS 1.5 08/20/2017   HGB 11.9 08/20/2017   HCT 35.8 08/20/2017   MCV 97.2 08/20/2017   PLT 214 08/20/2017      Chemistry      Component Value Date/Time   NA 136 08/20/2017 1137   NA 138 07/10/2017 1458   K 3.7 08/20/2017 1137   K 3.7 07/10/2017 1458   CL 103 08/20/2017 1137   CO2 24 08/20/2017 1137   CO2 25 07/10/2017 1458   BUN 8 08/20/2017 1137   BUN 7.4 07/10/2017 1458   CREATININE 0.70 08/20/2017 1137   CREATININE 0.7 07/10/2017 1458      Component Value Date/Time   CALCIUM 8.8 08/20/2017 1137   CALCIUM 8.9 07/10/2017 1458   ALKPHOS 56 08/20/2017 1137   ALKPHOS 92 07/10/2017 1458   AST 30 08/20/2017 1137   AST 10 07/10/2017 1458   ALT 54 08/20/2017 1137   ALT  11 07/10/2017 1458   BILITOT 0.3 08/20/2017 1137   BILITOT <0.22 07/10/2017 1458       No results found for: LABCA2  No components found for: XKPVVZ482  No results for input(s): INR in the last 168 hours.  No results found for:  LABCA2  No results found for: LMB867  No results found for: JQG920  No results found for: FEO712  No results found for: CA2729  No components found for: HGQUANT  No results found for: CEA1 / No results found for: CEA1   No results found for: AFPTUMOR  No results found for: CHROMOGRNA  No results found for: PSA1  Appointment on 08/20/2017  Component Date Value Ref Range Status  . Sodium 08/20/2017 136  136 - 145 mmol/L Final  . Potassium 08/20/2017 3.7  3.5 - 5.1 mmol/L Final  . Chloride 08/20/2017 103  98 - 109 mmol/L Final  . CO2 08/20/2017 24  22 - 29 mmol/L Final  . Glucose, Bld 08/20/2017 103  70 - 140 mg/dL Final  . BUN 08/20/2017 8  7 - 26 mg/dL Final  . Creatinine, Ser 08/20/2017 0.70  0.60 - 1.10 mg/dL Final  . Calcium 08/20/2017 8.8  8.4 - 10.4 mg/dL Final  . Total Protein 08/20/2017 6.7  6.4 - 8.3 g/dL Final  . Albumin 08/20/2017 3.8  3.5 - 5.0 g/dL Final  . AST 08/20/2017 30  5 - 34 U/L Final  . ALT 08/20/2017 54  0 - 55 U/L Final  . Alkaline Phosphatase 08/20/2017 56  40 - 150 U/L Final  . Total Bilirubin 08/20/2017 0.3  0.2 - 1.2 mg/dL Final  . GFR calc non Af Amer 08/20/2017 >60  >60 mL/min Final  . GFR calc Af Amer 08/20/2017 >60  >60 mL/min Final   Comment: (NOTE) The eGFR has been calculated using the CKD EPI equation. This calculation has not been validated in all clinical situations. eGFR's persistently <60 mL/min signify possible Chronic Kidney Disease.   Georgiann Hahn gap 08/20/2017 9  3 - 11 Final   Performed at [facility], [place] [address] [place], [place] [address]  . WBC 08/20/2017 2.7* 3.9 - 10.3 K/uL Final  . RBC 08/20/2017 3.69* 3.70 - 5.45 MIL/uL Final  . Hemoglobin 08/20/2017 11.9  11.6 - 15.9 g/dL Final  . HCT 08/20/2017 35.8  34.8 - 46.6 % Final  . MCV 08/20/2017 97.2  79.5 - 101.0 fL Final  . MCH 08/20/2017 32.2  25.1 - 34.0 pg Final  . MCHC 08/20/2017 33.1  31.5 - 36.0 g/dL Final  . RDW 08/20/2017 18.8* 11.2 -  14.5 % Final  . Platelets 08/20/2017 214  145 - 400 K/uL Final  . Neutrophils Relative % 08/20/2017 57  % Final  . Neutro Abs 08/20/2017 1.5  1.5 - 6.5 K/uL Final  . Lymphocytes Relative 08/20/2017 26  % Final  . Lymphs Abs 08/20/2017 0.7* 0.9 - 3.3 K/uL Final  . Monocytes Relative 08/20/2017 13  % Final  . Monocytes Absolute 08/20/2017 0.3  0.1 - 0.9 K/uL Final  . Eosinophils Relative 08/20/2017 1  % Final  . Eosinophils Absolute 08/20/2017 0.0  0.0 - 0.5 K/uL Final  . Basophils Relative 08/20/2017 3  % Final  . Basophils Absolute 08/20/2017 0.1  0.0 - 0.1 K/uL Final   Performed at [facility], [place] [address] [place],  [address]    (this displays the last labs from the last 3 days)  No  results found for: TOTALPROTELP, ALBUMINELP, A1GS, A2GS, BETS, BETA2SER, GAMS, MSPIKE, SPEI (this displays SPEP labs)  No results found for: KPAFRELGTCHN, LAMBDASER, KAPLAMBRATIO (kappa/lambda light chains)  No results found for: HGBA, HGBA2QUANT, HGBFQUANT, HGBSQUAN (Hemoglobinopathy evaluation)   No results found for: LDH  No results found for: IRON, TIBC, IRONPCTSAT (Iron and TIBC)  No results found for: FERRITIN  Urinalysis    Component Value Date/Time   COLORURINE YELLOW 07/07/2017 1400   APPEARANCEUR CLEAR 07/07/2017 1400   LABSPEC 1.003 (L) 07/07/2017 1400   LABSPEC 1.010 05/30/2017 1315   PHURINE 7.0 07/07/2017 1400   GLUCOSEU NEGATIVE 07/07/2017 1400   GLUCOSEU Negative 05/30/2017 1315   HGBUR NEGATIVE 07/07/2017 1400   BILIRUBINUR NEGATIVE 07/07/2017 1400   BILIRUBINUR Color Interference 05/30/2017 1315   KETONESUR NEGATIVE 07/07/2017 1400   PROTEINUR NEGATIVE 07/07/2017 1400   UROBILINOGEN Color Interference 05/30/2017 1315   NITRITE NEGATIVE 07/07/2017 1400   LEUKOCYTESUR NEGATIVE 07/07/2017 1400   LEUKOCYTESUR Color Interference 05/30/2017 1315     STUDIES: She will need a restaging MRI prior to definitive surgery  ELIGIBLE FOR  AVAILABLE RESEARCH PROTOCOL: SWOG 3557 depending on response to neoadjuvant treatment, UPBEAT  ASSESSMENT: 52 y.o. [place] status post left breast upper outer quadrant biopsy May 14, 2017 for a clinical T2 N0, stage IIB invasive ductal carcinoma, grade 3, triple negative, with an MIB-1 of 80%  (1) neoadjuvant chemotherapy will consist of doxorubicin and cyclophosphamide in dose dense fashion x4 starting 06/04/2017, completed 07/17/2017; followed by weekly carboplatin and paclitaxel x12 started 07/30/2017  (a) echo on 05/30/2017: LVEF 55-60%  (2) breast conserving surgery with sentinel lymph node sampling to follow  (3) adjuvant radiation to follow surgery  (4) genetics testing pending  PLAN: Deshana is tolerating her chemo remarkably well especially now that she has adjusted her nausea medicines.  More specifically she is taking 4 mg of Decadron twice daily on days 2 and 3 only.  She is using Compazine as needed.  So far there is no peripheral neuropathy.  I am concerned by the lack of response in the left breast.  I think she may end up qualifying for the SWOG trial [id number].  I will start discussing that with her at the next visit.  Otherwise we are proceeding to treatment today.  She is seeing me with every other treatment until she gets to the eighth dose at which point we will start seeing her on a weekly basis  She knows to call for any other issues that may develop before the next visit.  Solveig Fangman, [name], MD  08/20/17 12:42 PM Medical Oncology and Hematology [facility] [address] [place], [place] [address] Tel. [phone]    Fax. [phone]    This document serves as a record of services personally performed by Lurline Del, MD. It was created on his behalf by Steva Colder, a trained medical scribe. The creation of this record is based on the scribe's personal observations and the provider's statements to them.   I have reviewed the above  documentation for accuracy and completeness, and I agree with the above.

## 2017-08-20 NOTE — Addendum Note (Signed)
Addended by: Chauncey Cruel on: 08/20/2017 02:16 PM   Modules accepted: Orders

## 2017-08-20 NOTE — Patient Instructions (Signed)
[facility] Discharge Instructions for Patients Receiving Chemotherapy  Today you received the following chemotherapy agents:  Taxol, Carboplatin  To help prevent nausea and vomiting after your treatment, we encourage you to take your nausea medication as prescribed.   If you develop nausea and vomiting that is not controlled by your nausea medication, call the clinic.   BELOW ARE SYMPTOMS THAT SHOULD BE REPORTED IMMEDIATELY:  *FEVER GREATER THAN 100.5 F  *CHILLS WITH OR WITHOUT FEVER  NAUSEA AND VOMITING THAT IS NOT CONTROLLED WITH YOUR NAUSEA MEDICATION  *UNUSUAL SHORTNESS OF BREATH  *UNUSUAL BRUISING OR BLEEDING  TENDERNESS IN MOUTH AND THROAT WITH OR WITHOUT PRESENCE OF ULCERS  *URINARY PROBLEMS  *BOWEL PROBLEMS  UNUSUAL RASH Items with * indicate a potential emergency and should be followed up as soon as possible.  Feel free to call the clinic should you have any questions or concerns. The clinic phone number is [phone].  Please show the CHEMO ALERT CARD at check-in to the Emergency Department and triage nurse.   

## 2017-08-21 ENCOUNTER — Other Ambulatory Visit: Payer: Self-pay | Admitting: Oncology

## 2017-08-21 DIAGNOSIS — C50412 Malignant neoplasm of upper-outer quadrant of left female breast: Secondary | ICD-10-CM

## 2017-08-21 DIAGNOSIS — Z171 Estrogen receptor negative status [ER-]: Principal | ICD-10-CM

## 2017-08-26 ENCOUNTER — Ambulatory Visit (HOSPITAL_COMMUNITY)
Admission: RE | Admit: 2017-08-26 | Discharge: 2017-08-26 | Disposition: A | Discharge status: Home / Self Care | Payer: 59 | Source: Ambulatory Visit | Attending: Family Medicine | Admitting: Family Medicine

## 2017-08-26 ENCOUNTER — Ambulatory Visit (HOSPITAL_BASED_OUTPATIENT_CLINIC_OR_DEPARTMENT_OTHER)
Admission: RE | Admit: 2017-08-26 | Discharge: 2017-08-26 | Disposition: A | Discharge status: Home / Self Care | Payer: 59 | Source: Ambulatory Visit | Attending: Internal Medicine | Admitting: Internal Medicine

## 2017-08-26 ENCOUNTER — Encounter (HOSPITAL_COMMUNITY): Payer: Self-pay | Admitting: Internal Medicine

## 2017-08-26 VITALS — BP 130/84 | HR 83 | Wt 174.5 lb

## 2017-08-26 DIAGNOSIS — C50412 Malignant neoplasm of upper-outer quadrant of left female breast: Secondary | ICD-10-CM | POA: Diagnosis not present

## 2017-08-26 DIAGNOSIS — Z171 Estrogen receptor negative status [ER-]: Secondary | ICD-10-CM | POA: Diagnosis present

## 2017-08-26 DIAGNOSIS — I340 Nonrheumatic mitral (valve) insufficiency: Secondary | ICD-10-CM | POA: Insufficient documentation

## 2017-08-26 NOTE — Patient Instructions (Signed)
Echocardiogram and Follow up in 6 Months

## 2017-08-26 NOTE — Progress Notes (Signed)
  Echocardiogram 2D Echocardiogram has been performed.  Amy Dawson 08/26/2017, 11:41 AM

## 2017-08-26 NOTE — Progress Notes (Signed)
Cardio-Oncology Clinic Consult Note   Referring Physician: Dr. [name] Primary Care: Dr. Cathi Roan Primary Cardiologist: New  HPI:  Amy Dawson is a 52 y.o. female with past medical history of left Breast Cancer who was referred today by Dr. [name] to establish in the cardio-oncology clinic for monitoring of cardio-toxicty while undergoing chemotherapy.  Cancer Profile  Left breast outer quadrant biopsy May 14, 2017 for a clinical T2 N0, stage IIB invasive ductal carcinoma, grade 3, triple negative, with an MIB-1 of 80%    05/14/2017 Initial Diagnosis       Treatment Plan  - neoadjuvant chemotherapy finished 07/17/2017 consisting of doxorubicin and cyclophosphamide in dose dense fashion x4.  - Now getting weekly carboplatin and paclitaxel x12 started 07/30/2017 - Breast conserving surgery with sentinel lymph node sampling to follow - Adjuvant radiation to follow surgery.   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 Denies any h/o known cardiac problems. Today, she is feeling well. She is starting to feel better now that she has finished chemotherapy. She is starting to have improved appetite and hair is growing back. Denies SOB with daily activities. She has not had any peripheral neuropathy thus far.  She remains on anti-nausea medication.   Echo today shows EF 65-70%, with grade 1 DD, GLS -22.8, Lateral S' 10.4 cm (Personally reviewed)  Review of Systems: [y] = yes, [ ]  = no   General: Weight gain [ ] ; Weight loss [y]; Anorexia [ ] ; Fatigue [y]; Fever [ ] ; Chills [ ] ; Weakness [ ]   Cardiac: Chest pain/pressure [ ] ; Resting SOB [ ] ; Exertional SOB [ ] ; Orthopnea [ ] ; Pedal Edema [ ] ; Palpitations [ ] ; Syncope [ ] ; Presyncope [ ] ; Paroxysmal nocturnal dyspnea[ ]   Pulmonary: Cough [ ] ; Wheezing[ ] ; Hemoptysis[ ] ; Sputum [ ] ; Snoring [ ]   GI: Nausea [y] Vomiting[ ] ; Dysphagia[ ] ; Melena[ ] ; Hematochezia [ ] ; Heartburn[ ] ; Abdominal pain [ ] ; Constipation [ ] ;  Diarrhea [ ] ; BRBPR [ ]   GU: Hematuria[ ] ; Dysuria [ ] ; Nocturia[ ]   Vascular: Pain in legs with walking [ ] ; Pain in feet with lying flat [ ] ; Non-healing sores [ ] ; Stroke [ ] ; TIA [ ] ; Slurred speech [ ] ;  Neuro: Headaches[ ] ; Vertigo[ ] ; Seizures[ ] ; Paresthesias[ ] ;Blurred vision [ ] ; Diplopia [ ] ; Vision changes [ ]   Ortho/Skin: Arthritis [y]; Joint pain [y]; Muscle pain [ ] ; Joint swelling [ ] ; Back Pain [ ] ; Rash [ ]   Psych: Depression[ ] ; Anxiety[ ]   Heme: Bleeding problems [ ] ; Clotting disorders [ ] ; Anemia [ ]   Endocrine: Diabetes [ ] ; Thyroid dysfunction[ ]    Past Medical History:  Diagnosis Date  . Allergy   . Anxiety   . Breast cancer ([place])   . Chronic kidney disease    bladder issues  . Depression   . Family history of breast cancer   . Fibromyalgia   . Genetic testing 06/13/2017   Breast/GYN panel (23 genes) @ [facility] - No pathogenic mutations detected  . Headache    smokes marijuana for migraine pain  . Murmur, cardiac    as a child  . Stroke ([place]) 2000   numbness on left side  . TMJ (dislocation of temporomandibular joint)     Current Outpatient Medications  Medication Sig Dispense Refill  . amitriptyline (ELAVIL) 50 MG tablet Take 25 mg by mouth at bedtime as needed for sleep.    . Calcium Glycerophosphate (PRELIEF PO) Take 3 tablets by mouth.    Marland Kitchen  Cetirizine HCl (ZYRTEC ALLERGY PO) Take by mouth daily.    Marland Kitchen dimenhyDRINATE (DRAMAMINE) 50 MG tablet Take 50 mg every 8 (eight) hours as needed by mouth.    . fluticasone (FLONASE) 50 MCG/ACT nasal spray Place 2 sprays into both nostrils daily.    Marland Kitchen LORazepam (ATIVAN) 0.5 MG tablet Take 1 tablet (0.5 mg total) by mouth at bedtime as needed for anxiety. 30 tablet 0  . Pseudoephedrine HCl (SUDAFED PO) Take by mouth as needed.    . traMADol (ULTRAM) 50 MG tablet Take 2 tablets (100 mg total) every 6 (six) hours as needed by mouth. 10 tablet 0   No current facility-administered medications for this encounter.      Allergies  Allergen Reactions  . Other Other (See Comments)    Red wine & white cheese: migraine Raw egg whites- hives      Social History   Socioeconomic History  . Marital status: Married    Spouse name: Not on file  . Number of children: Not on file  . Years of education: Not on file  . Highest education level: Not on file  Social Needs  . Financial resource strain: Not on file  . Food insecurity - worry: Not on file  . Food insecurity - inability: Not on file  . Transportation needs - medical: Not on file  . Transportation needs - non-medical: Not on file  Occupational History  . Not on file  Tobacco Use  . Smoking status: Former Research scientist (life sciences)  . Smokeless tobacco: Never Used  Substance and Sexual Activity  . Alcohol use: Yes    Alcohol/week: 0.0 oz    Comment: sometimes  . Drug use: Yes    Types: Marijuana    Comment: last smoked 05-29-17  . Sexual activity: Not on file  Other Topics Concern  . Not on file  Social History Narrative  . Not on file      Family History  Problem Relation Age of Onset  . Liver cancer Sister        "rare type"; deceased 31  . Breast cancer Maternal Aunt        Dx 92s; currently 27s  . Breast cancer Paternal Grandmother        Dx 59s; deceased 11s  . Breast cancer Other        mat grandfather's sister; dx 28s; deceased 83s  . Cancer Paternal Aunt        unk. type; deceased 38  . Colon cancer Neg Hx   . Esophageal cancer Neg Hx   . Rectal cancer Neg Hx   . Stomach cancer Neg Hx     Vitals:   08/26/17 1218  BP: 130/84  Pulse: 83  SpO2: 98%  Weight: 174 lb 8 oz (79.2 kg)   Wt Readings from Last 3 Encounters:  08/26/17 174 lb 8 oz (79.2 kg)  08/20/17 173 lb 8 oz (78.7 kg)  08/06/17 175 lb 8 oz (79.6 kg)    PHYSICAL EXAM: General:  Well appearing. No respiratory difficulty HEENT: normal x for alopecia Neck: supple. no JVD. Carotids 2+ bilat; no bruits. No lymphadenopathy or thyromegaly appreciated. Cor: PMI  nondisplaced. Regular rate & rhythm. No rubs, gallops or murmurs. + port-a-cath Lungs: clear Abdomen: soft, nontender, nondistended. No hepatosplenomegaly. No bruits or masses. Good bowel sounds. Extremities: no cyanosis, clubbing, rash, edema Neuro: alert & oriented x 3, cranial nerves grossly intact. moves all 4 extremities w/o difficulty. Affect pleasant.  ASSESSMENT & PLAN:  1. L breast Cancer, Upper-Outer Quadrant, Estrogen Receptor Negative - neoadjuvant chemotherapy finished 07/17/2017 consisting of doxorubicin and cyclophosphamide in dose dense fashion x4.  - Now getting weekly carboplatin and paclitaxel x12 started 07/30/2017 - Will repeat US in July (6 months post therapy)  Explained incidence of doxarubicin cardiotoxicity and role of Cardio-oncology clinic at length. Echo images reviewed personally. All parameters stable. Reviewed signs and symptoms of HF to look for. Follow-up with echo in 5-6 months.  Shirley Friar, PA-C 08/26/17   Patient seen and examined with the above-signed Advanced Practice Provider and/or Housestaff. I personally reviewed laboratory data, imaging studies and relevant notes. I independently examined the patient and formulated the important aspects of the plan. I have edited the note to reflect any of my changes or salient points. I have personally discussed the plan with the patient and/or family.  I reviewed echos personally. EF and Doppler parameters stable. No HF on exam. Will see back in 6 months for repeat echo to ensure stability. I explained incidence of Adriamycin cardiotoxicity in detail include small possibility of very delayed toxicity.   Glori Bickers, MD  10:03 PM

## 2017-08-27 ENCOUNTER — Inpatient Hospital Stay: Payer: 59

## 2017-08-27 VITALS — BP 124/84 | HR 78 | Temp 98.7°F | Resp 17

## 2017-08-27 DIAGNOSIS — C50412 Malignant neoplasm of upper-outer quadrant of left female breast: Secondary | ICD-10-CM

## 2017-08-27 DIAGNOSIS — Z171 Estrogen receptor negative status [ER-]: Principal | ICD-10-CM

## 2017-08-27 DIAGNOSIS — Z5189 Encounter for other specified aftercare: Secondary | ICD-10-CM | POA: Diagnosis not present

## 2017-08-27 LAB — CBC WITH DIFFERENTIAL/PLATELET
BASOS ABS: 0 10*3/uL (ref 0.0–0.1)
BASOS PCT: 1 %
Eosinophils Absolute: 0 10*3/uL (ref 0.0–0.5)
Eosinophils Relative: 1 %
HEMATOCRIT: 35.3 % (ref 34.8–46.6)
HEMOGLOBIN: 11.9 g/dL (ref 11.6–15.9)
LYMPHS PCT: 28 %
Lymphs Abs: 0.8 10*3/uL — ABNORMAL LOW (ref 0.9–3.3)
MCH: 33.4 pg (ref 25.1–34.0)
MCHC: 33.9 g/dL (ref 31.5–36.0)
MCV: 98.8 fL (ref 79.5–101.0)
Monocytes Absolute: 0.3 10*3/uL (ref 0.1–0.9)
Monocytes Relative: 9 %
NEUTROS ABS: 1.9 10*3/uL (ref 1.5–6.5)
NEUTROS PCT: 61 %
Platelets: 177 10*3/uL (ref 145–400)
RBC: 3.57 MIL/uL — AB (ref 3.70–5.45)
RDW: 18.8 % — AB (ref 11.2–14.5)
WBC: 3.1 10*3/uL — AB (ref 3.9–10.3)

## 2017-08-27 LAB — COMPREHENSIVE METABOLIC PANEL
ALBUMIN: 3.9 g/dL (ref 3.5–5.0)
ALT: 30 U/L (ref 0–55)
AST: 22 U/L (ref 5–34)
Alkaline Phosphatase: 67 U/L (ref 40–150)
Anion gap: 10 (ref 3–11)
BILIRUBIN TOTAL: 0.3 mg/dL (ref 0.2–1.2)
BUN: 11 mg/dL (ref 7–26)
CO2: 25 mmol/L (ref 22–29)
CREATININE: 0.75 mg/dL (ref 0.60–1.10)
Calcium: 9.3 mg/dL (ref 8.4–10.4)
Chloride: 105 mmol/L (ref 98–109)
GFR calc Af Amer: >60 mL/min (ref >60)
GLUCOSE: 94 mg/dL (ref 70–140)
POTASSIUM: 3.9 mmol/L (ref 3.5–5.1)
Sodium: 140 mmol/L (ref 136–145)
TOTAL PROTEIN: 6.9 g/dL (ref 6.4–8.3)

## 2017-08-27 MED ORDER — DEXAMETHASONE SODIUM PHOSPHATE 10 MG/ML IJ SOLN
INTRAMUSCULAR | Status: AC
  Filled 2017-08-27: qty 1

## 2017-08-27 MED ORDER — SODIUM CHLORIDE 0.9 % IV SOLN
80.0000 mg/m2 | Freq: Once | INTRAVENOUS | Status: AC
  Administered 2017-08-27: 156 mg via INTRAVENOUS
  Filled 2017-08-27: qty 26

## 2017-08-27 MED ORDER — SODIUM CHLORIDE 0.9 % IV SOLN
20.0000 mg | Freq: Once | INTRAVENOUS | Status: AC
  Administered 2017-08-27: 20 mg via INTRAVENOUS
  Filled 2017-08-27: qty 2

## 2017-08-27 MED ORDER — SODIUM CHLORIDE 0.9% FLUSH
10.0000 mL | INTRAVENOUS | Status: DC | PRN
  Administered 2017-08-27: 10 mL
  Filled 2017-08-27: qty 10

## 2017-08-27 MED ORDER — FAMOTIDINE IN NACL 20-0.9 MG/50ML-% IV SOLN: 20.0000 mg | Freq: Once | INTRAVENOUS | Status: DC

## 2017-08-27 MED ORDER — PALONOSETRON HCL INJECTION 0.25 MG/5ML
INTRAVENOUS | Status: AC
  Filled 2017-08-27: qty 5

## 2017-08-27 MED ORDER — DEXAMETHASONE SODIUM PHOSPHATE 10 MG/ML IJ SOLN
10.0000 mg | Freq: Once | INTRAMUSCULAR | Status: AC
  Administered 2017-08-27: 10 mg via INTRAVENOUS

## 2017-08-27 MED ORDER — DIPHENHYDRAMINE HCL 50 MG/ML IJ SOLN
INTRAMUSCULAR | Status: AC
  Filled 2017-08-27: qty 1

## 2017-08-27 MED ORDER — DIPHENHYDRAMINE HCL 50 MG/ML IJ SOLN
25.0000 mg | Freq: Once | INTRAMUSCULAR | Status: AC
  Administered 2017-08-27: 25 mg via INTRAVENOUS

## 2017-08-27 MED ORDER — PALONOSETRON HCL INJECTION 0.25 MG/5ML
0.2500 mg | Freq: Once | INTRAVENOUS | Status: AC
  Administered 2017-08-27: 0.25 mg via INTRAVENOUS

## 2017-08-27 MED ORDER — HEPARIN SOD (PORK) LOCK FLUSH 100 UNIT/ML IV SOLN
500.0000 [IU] | Freq: Once | INTRAVENOUS | Status: AC | PRN
  Administered 2017-08-27: 500 [IU]
  Filled 2017-08-27: qty 5

## 2017-08-27 MED ORDER — SODIUM CHLORIDE 0.9 % IV SOLN
Freq: Once | INTRAVENOUS | Status: AC
  Administered 2017-08-27: 11:00:00 via INTRAVENOUS

## 2017-08-27 MED ORDER — SODIUM CHLORIDE 0.9 % IV SOLN
258.2000 mg | Freq: Once | INTRAVENOUS | Status: AC
  Administered 2017-08-27: 260 mg via INTRAVENOUS
  Filled 2017-08-27: qty 26

## 2017-08-27 NOTE — Patient Instructions (Signed)
[facility] Discharge Instructions for Patients Receiving Chemotherapy  Today you received the following chemotherapy agents: Paclitaxel (Taxol) and Carboplatin (Paraplatin).  To help prevent nausea and vomiting after your treatment, we encourage you to take your nausea medication as prescribed. Received Aloxi during treatment-->take Compazine (not Zofran) for the next 3 days.  If you develop nausea and vomiting that is not controlled by your nausea medication, call the clinic.   BELOW ARE SYMPTOMS THAT SHOULD BE REPORTED IMMEDIATELY:  *FEVER GREATER THAN 100.5 F  *CHILLS WITH OR WITHOUT FEVER  NAUSEA AND VOMITING THAT IS NOT CONTROLLED WITH YOUR NAUSEA MEDICATION  *UNUSUAL SHORTNESS OF BREATH  *UNUSUAL BRUISING OR BLEEDING  TENDERNESS IN MOUTH AND THROAT WITH OR WITHOUT PRESENCE OF ULCERS  *URINARY PROBLEMS  *BOWEL PROBLEMS  UNUSUAL RASH Items with * indicate a potential emergency and should be followed up as soon as possible.  Feel free to call the clinic should you have any questions or concerns. The clinic phone number is [phone].  Please show the CHEMO ALERT CARD at check-in to the Emergency Department and triage nurse.   

## 2017-08-28 ENCOUNTER — Other Ambulatory Visit: Payer: Self-pay | Admitting: *Deleted

## 2017-08-28 DIAGNOSIS — Z171 Estrogen receptor negative status [ER-]: Principal | ICD-10-CM

## 2017-08-28 DIAGNOSIS — C50412 Malignant neoplasm of upper-outer quadrant of left female breast: Secondary | ICD-10-CM

## 2017-08-28 MED ORDER — DEXAMETHASONE 4 MG PO TABS: ORAL_TABLET | ORAL | 1 refills | Status: DC

## 2017-08-28 MED ORDER — PROCHLORPERAZINE MALEATE 10 MG PO TABS: 10.0000 mg | ORAL_TABLET | Freq: Four times a day (QID) | ORAL | 1 refills | Status: DC | PRN

## 2017-08-30 NOTE — Progress Notes (Signed)
Amy Dawson  [phone] [phone]     ID: Amy Dawson DOB: 03-20-1966  MR#: [id number]  GQQ#:[id number]  Patient Care Team: Amy Freeze, MD as PCP - General (Family Medicine) Amy Dawson, Amy Dad, MD as Consulting Physician (Oncology) Amy Bookbinder, MD as Consulting Physician (General Surgery) Amy Rudd, MD as Consulting Physician (Radiation Oncology) Amy Gully, NP as Nurse Practitioner (Obstetrics and Gynecology) OTHER MD:  CHIEF COMPLAINT: Triple negative breast cancer  CURRENT TREATMENT: Neoadjuvant chemotherapy   HISTORY OF CURRENT ILLNESS: From the original intake note:  Amy Dawson felt some discomfort in the left breast in June 2018, but did not think much of it.  However in October she palpated a change in the upper outer quadrant of her left breast and she brought this to medical attention.  On May 09, 2017 she had bilateral diagnostic mammography with tomography at the breast center, showing the breast density to be category capital C.  In the left breast that was not obscured mass in the upper outer quadrant, which by palpation was identified at the 1230 o'clock radiant 5 cm from the nipple.  By ultrasound this measured 2.9 cm, and it was irregular and hypoechoic.  The left axilla was sonographically benign.  Biopsy of the left breast mass in question May 14, 2017 showed [id number]) and invasive ductal carcinoma, grade 2 or more likely 3, estrogen and progesterone receptor negative, with no HER-2 amplification, the signals ratio being 1.18 and the number per cell 1.65.  The key 67 was 80%.  The patient's subsequent history is as detailed below.  INTERVAL HISTORY: Amy Dawson returns today for follow-up and treatment of her triple negative breast cancer. She continues on paclitaxel and carboplatin treatments every 7 days with dose #6 of 12 planned due today, with good tolerance. She notes that her nails feel uncomfortable. She  notes that her energy is lower than usual. She notes that her port is working well, but it is sore.   REVIEW OF SYSTEMS: Amy Dawson reports that she is doing fine. She denies oral sores and peripheral neuropathy. She denies unusual headaches, visual changes, nausea, vomiting, or dizziness. There has been no unusual cough, phlegm production, or pleurisy. This been no change in bowel or bladder habits. She denies unexplained fatigue or unexplained weight loss, bleeding, rash, or fever. A detailed review of systems was otherwise stable.    PAST MEDICAL HISTORY: Past Medical History:  Diagnosis Date  . Allergy   . Anxiety   . Breast cancer ([place])   . Chronic kidney disease    bladder issues  . Depression   . Family history of breast cancer   . Fibromyalgia   . Genetic testing 06/13/2017   Breast/GYN panel (23 genes) @ [facility] - No pathogenic mutations detected  . Headache    smokes marijuana for migraine pain  . Murmur, cardiac    as a child  . Stroke ([place]) 2000   numbness on left side  . TMJ (dislocation of temporomandibular joint)     PAST SURGICAL HISTORY: Past Surgical History:  Procedure Laterality Date  . CESAREAN SECTION     2  . ENDOMETRIAL ABLATION  2011  . PORTACATH PLACEMENT Right 05/31/2017   Procedure: INSERTION PORT-A-CATH WITH [place];  Surgeon: Amy Bookbinder, MD;  Location: [facility];  Service: General;  Laterality: Right;    FAMILY HISTORY Family History  Problem Relation Age of Onset  . Liver cancer Sister        "rare  type"; deceased 69  . Breast cancer Maternal Aunt        Dx 54s; currently 59s  . Breast cancer Paternal Grandmother        Dx 78s; deceased 68s  . Breast cancer Other        mat grandfather's sister; dx 21s; deceased 63s  . Cancer Paternal Aunt        unk. type; deceased 87  . Colon cancer Neg Hx   . Esophageal cancer Neg Hx   . Rectal cancer Neg Hx   . Stomach cancer Neg Hx   The patient's parents are in their mid 72s as  of November 2018.  The patient had 1 brother, 1 sister.  The sister died from a rare liver cancer at age 41.  A maternal aunt and a paternal grandmother were diagnosed with breast cancer in their 18s.  GYNECOLOGIC HISTORY:  No LMP recorded. Patient has had an ablation. Menarche age 47.  She underwent endometrial ablation 2011.  She is no longer having periods.  She never used hormone replacement.  First live birth was age 39.  She is GX P2.   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  Her husband Amy Dawson works as an Firefighter for [employer]  Amy Dawson Amy Dawson attends [employer].  Amy Dawson Amy Dawson is doing on apprenticeship in the [employer] program.    ADVANCED DIRECTIVES: Not in place   HEALTH MAINTENANCE: Social History   Tobacco Use  . Smoking status: Former Research scientist (life sciences)  . Smokeless tobacco: Never Used  Substance Use Topics  . Alcohol use: Yes    Alcohol/week: 0.0 oz    Comment: sometimes  . Drug use: Yes    Types: Marijuana    Comment: last smoked 05-29-17     Colonoscopy: 2016/[facility]  PAP: October 2018  Bone density: Never   Allergies  Allergen Reactions  . Other Other (See Comments)    Red wine & white cheese: migraine Raw egg whites- hives    Current Outpatient Medications  Medication Sig Dispense Refill  . amitriptyline (ELAVIL) 50 MG tablet Take 25 mg by mouth at bedtime as needed for sleep.    . Calcium Glycerophosphate (PRELIEF PO) Take 3 tablets by mouth.    . Cetirizine HCl (ZYRTEC ALLERGY PO) Take by mouth daily.    Marland Kitchen dexamethasone (DECADRON) 4 MG tablet Take 2 tablets by mouth once a day on the day after chemotherapy and then take 2 tablets two times a day for 2 days. Take with food. 30 tablet 1  . dimenhyDRINATE (DRAMAMINE) 50 MG tablet Take 50 mg every 8 (eight) hours as needed by mouth.    . fluticasone (FLONASE) 50 MCG/ACT nasal spray Place 2 sprays into both nostrils daily.    Marland Kitchen LORazepam  (ATIVAN) 0.5 MG tablet Take 1 tablet (0.5 mg total) by mouth at bedtime as needed for anxiety. 30 tablet 0  . prochlorperazine (COMPAZINE) 10 MG tablet Take 1 tablet (10 mg total) by mouth every 6 (six) hours as needed (Nausea or vomiting). 30 tablet 1  . Pseudoephedrine HCl (SUDAFED PO) Take by mouth as needed.    . traMADol (ULTRAM) 50 MG tablet Take 2 tablets (100 mg total) every 6 (six) hours as needed by mouth. 10 tablet 0   No current facility-administered medications for this visit.     OBJECTIVE: Middle-aged white woman who appears well  Vitals:   09/03/17 1131  BP: 117/88  Pulse: (!) 108  Resp: 20  Temp: 98.3 F (36.8 C)  SpO2: 100%     Body mass index is 25.3 kg/m.   Wt Readings from Last 3 Encounters:  09/03/17 170 lb 1.6 oz (77.2 kg)  08/26/17 174 lb 8 oz (79.2 kg)  08/20/17 173 lb 8 oz (78.7 kg)     ECOG FS:1 - Symptomatic but completely ambulatory  Sclerae unicteric, pupils round and equal Oropharynx clear and moist No cervical or supraclavicular adenopathy Lungs no rales or rhonchi Heart regular rate and rhythm Abd soft, nontender, positive bowel sounds MSK no focal spinal tenderness, no upper extremity lymphedema Neuro: nonfocal, well oriented, appropriate affect Breasts: The right breast is benign.  The mass in the left breast actually does seem smaller at this time.  It is still palpable and is still fairly firm.  It is movable.  There is no overlying erythema.  Both axillae are benign.  LAB RESULTS:  CMP     Component Value Date/Time   NA 140 08/27/2017 1054   NA 138 07/10/2017 1458   K 3.9 08/27/2017 1054   K 3.7 07/10/2017 1458   CL 105 08/27/2017 1054   CO2 25 08/27/2017 1054   CO2 25 07/10/2017 1458   GLUCOSE 94 08/27/2017 1054   GLUCOSE 95 07/10/2017 1458   BUN 11 08/27/2017 1054   BUN 7.4 07/10/2017 1458   CREATININE 0.75 08/27/2017 1054   CREATININE 0.7 07/10/2017 1458   CALCIUM 9.3 08/27/2017 1054   CALCIUM 8.9 07/10/2017 1458    PROT 6.9 08/27/2017 1054   PROT 7.1 07/10/2017 1458   ALBUMIN 3.9 08/27/2017 1054   ALBUMIN 3.8 07/10/2017 1458   AST 22 08/27/2017 1054   AST 10 07/10/2017 1458   ALT 30 08/27/2017 1054   ALT 11 07/10/2017 1458   ALKPHOS 67 08/27/2017 1054   ALKPHOS 92 07/10/2017 1458   BILITOT 0.3 08/27/2017 1054   BILITOT <0.22 07/10/2017 1458   GFRNONAA >60 08/27/2017 1054   GFRAA >60 08/27/2017 1054    No results found for: TOTALPROTELP, ALBUMINELP, A1GS, A2GS, BETS, BETA2SER, GAMS, MSPIKE, SPEI  No results found for: Nils Pyle, [facility]  Lab Results  Component Value Date   WBC 3.1 (L) 09/03/2017   NEUTROABS 2.0 09/03/2017   HGB 12.6 09/03/2017   HCT 36.9 09/03/2017   MCV 98.4 09/03/2017   PLT 208 09/03/2017      Chemistry      Component Value Date/Time   NA 140 08/27/2017 1054   NA 138 07/10/2017 1458   K 3.9 08/27/2017 1054   K 3.7 07/10/2017 1458   CL 105 08/27/2017 1054   CO2 25 08/27/2017 1054   CO2 25 07/10/2017 1458   BUN 11 08/27/2017 1054   BUN 7.4 07/10/2017 1458   CREATININE 0.75 08/27/2017 1054   CREATININE 0.7 07/10/2017 1458      Component Value Date/Time   CALCIUM 9.3 08/27/2017 1054   CALCIUM 8.9 07/10/2017 1458   ALKPHOS 67 08/27/2017 1054   ALKPHOS 92 07/10/2017 1458   AST 22 08/27/2017 1054   AST 10 07/10/2017 1458   ALT 30 08/27/2017 1054   ALT 11 07/10/2017 1458   BILITOT 0.3 08/27/2017 1054   BILITOT <0.22 07/10/2017 1458       No results found for: LABCA2  No components found for: ZOXWRU045  No results for input(s): INR in the last 168 hours.  No results found for: LABCA2  No results found for: WUJ811  No results  found for: BZM080  No results found for: EMV361  No results found for: CA2729  No components found for: HGQUANT  No results found for: CEA1 / No results found for: CEA1   No results found for: AFPTUMOR  No results found for: [place]  No results found for: PSA1  Appointment on 09/03/2017    Component Date Value Ref Range Status  . WBC 09/03/2017 3.1* 3.9 - 10.3 K/uL Final  . RBC 09/03/2017 3.75  3.70 - 5.45 MIL/uL Final  . Hemoglobin 09/03/2017 12.6  11.6 - 15.9 g/dL Final  . HCT 09/03/2017 36.9  34.8 - 46.6 % Final  . MCV 09/03/2017 98.4  79.5 - 101.0 fL Final  . MCH 09/03/2017 33.6  25.1 - 34.0 pg Final  . MCHC 09/03/2017 34.1  31.5 - 36.0 g/dL Final  . RDW 09/03/2017 16.7* 11.2 - 14.5 % Final  . Platelets 09/03/2017 208  145 - 400 K/uL Final  . Neutrophils Relative % 09/03/2017 65  % Final  . Neutro Abs 09/03/2017 2.0  1.5 - 6.5 K/uL Final  . Lymphocytes Relative 09/03/2017 25  % Final  . Lymphs Abs 09/03/2017 0.8* 0.9 - 3.3 K/uL Final  . Monocytes Relative 09/03/2017 10  % Final  . Monocytes Absolute 09/03/2017 0.3  0.1 - 0.9 K/uL Final  . Eosinophils Relative 09/03/2017 0  % Final  . Eosinophils Absolute 09/03/2017 0.0  0.0 - 0.5 K/uL Final  . Basophils Relative 09/03/2017 0  % Final  . Basophils Absolute 09/03/2017 0.0  0.0 - 0.1 K/uL Final   Performed at [facility], [address] [place], [place] [address]    (this displays the last labs from the last 3 days)  No results found for: TOTALPROTELP, ALBUMINELP, A1GS, A2GS, BETS, BETA2SER, GAMS, MSPIKE, SPEI (this displays SPEP labs)  No results found for: KPAFRELGTCHN, LAMBDASER, KAPLAMBRATIO (kappa/lambda light chains)  No results found for: HGBA, HGBA2QUANT, HGBFQUANT, HGBSQUAN (Hemoglobinopathy evaluation)   No results found for: LDH  No results found for: IRON, TIBC, IRONPCTSAT (Iron and TIBC)  No results found for: FERRITIN  Urinalysis    Component Value Date/Time   COLORURINE YELLOW 07/07/2017 1400   APPEARANCEUR CLEAR 07/07/2017 1400   LABSPEC 1.003 (L) 07/07/2017 1400   LABSPEC 1.010 05/30/2017 1315   PHURINE 7.0 07/07/2017 1400   GLUCOSEU NEGATIVE 07/07/2017 1400   GLUCOSEU Negative 05/30/2017 1315   HGBUR NEGATIVE 07/07/2017 1400   BILIRUBINUR NEGATIVE  07/07/2017 1400   BILIRUBINUR Color Interference 05/30/2017 1315   KETONESUR NEGATIVE 07/07/2017 1400   PROTEINUR NEGATIVE 07/07/2017 1400   UROBILINOGEN Color Interference 05/30/2017 1315   NITRITE NEGATIVE 07/07/2017 1400   LEUKOCYTESUR NEGATIVE 07/07/2017 1400   LEUKOCYTESUR Color Interference 05/30/2017 1315     STUDIES: No results found.   ELIGIBLE FOR AVAILABLE RESEARCH PROTOCOL: SWOG [id number] depending on response to neoadjuvant treatment, UPBEAT  ASSESSMENT: 52 y.o. [place] status post left breast upper outer quadrant biopsy May 14, 2017 for a clinical T2 N0, stage IIB invasive ductal carcinoma, grade 3, triple negative, with an MIB-1 of 80%  (1) neoadjuvant chemotherapy consisting of doxorubicin and cyclophosphamide in dose dense fashion x4 started 06/04/2017, completed 07/17/2017; followed by weekly carboplatin and paclitaxel x12 started 07/30/2017  (a) echo on 05/30/2017: LVEF 55-60%  (2) breast conserving surgery with sentinel lymph node sampling to follow  (3) adjuvant radiation to follow surgery  (4) genetics testing offered through [facility] completed on 06/13/2017 showed: No deleterious mutations identified. The following genes were  evaluated for sequence changes and exonic deletions/duplications: ATM, BARD1, BRCA1, BRCA2, BRIP1, CDH1, CHEK2, DICER1, EPCAM*, MLH1, MSH2, MSH6, NBN, NF1, PALB2, PMS2, PTEN, RAD50, RAD51C, RAD51D, SMARCA4, STK11, TP53 Results are negative unless otherwise indicated   PLAN: Latrease continues to tolerate treatment well, with no evidence of neuropathy, and we do seem to be finally making some progress.  The mass in the left breast does appear smaller although it is still palpable.  She may turn out to be a good candidate for this walk S1418 study and today I began to discuss it with her.  She understands this is a form of immunotherapy, and that we do not yet know if it is going to work and breast cancer which is the reason this study  randomizes patients to treatment versus observation  I am seeing her every other treatment, but she understands if she develops any sensation of neuropathy at all she needs to let me know before her next cycle  She will call with any other issues that may develop before then.   Kalii Chesmore, Amy Dad, MD  09/03/17 11:44 AM Medical Oncology and Hematology [facility] [address] [place], [place] [address] Tel. [phone]    Fax. [phone]  This document serves as a record of services personally performed by Lurline Del, MD. It was created on his behalf by Sheron Nightingale, a trained medical scribe. The creation of this record is based on the scribe's personal observations and the provider's statements to them.   I have reviewed the above documentation for accuracy and completeness, and I agree with the above.

## 2017-09-03 ENCOUNTER — Inpatient Hospital Stay: Payer: 59

## 2017-09-03 ENCOUNTER — Other Ambulatory Visit: Payer: 59

## 2017-09-03 ENCOUNTER — Inpatient Hospital Stay (HOSPITAL_BASED_OUTPATIENT_CLINIC_OR_DEPARTMENT_OTHER): Payer: 59 | Admitting: Oncology

## 2017-09-03 VITALS — BP 117/88 | HR 108 | Temp 98.3°F | Resp 20 | Ht 68.75 in | Wt 170.1 lb

## 2017-09-03 DIAGNOSIS — C50412 Malignant neoplasm of upper-outer quadrant of left female breast: Secondary | ICD-10-CM

## 2017-09-03 DIAGNOSIS — Z171 Estrogen receptor negative status [ER-]: Secondary | ICD-10-CM

## 2017-09-03 DIAGNOSIS — Z5189 Encounter for other specified aftercare: Secondary | ICD-10-CM | POA: Diagnosis not present

## 2017-09-03 DIAGNOSIS — Z95828 Presence of other vascular implants and grafts: Secondary | ICD-10-CM

## 2017-09-03 LAB — CBC WITH DIFFERENTIAL/PLATELET
Basophils Absolute: 0 10*3/uL (ref 0.0–0.1)
Basophils Relative: 0 %
Eosinophils Absolute: 0 10*3/uL (ref 0.0–0.5)
Eosinophils Relative: 0 %
HCT: 36.9 % (ref 34.8–46.6)
Hemoglobin: 12.6 g/dL (ref 11.6–15.9)
LYMPHS ABS: 0.8 10*3/uL — AB (ref 0.9–3.3)
LYMPHS PCT: 25 %
MCH: 33.6 pg (ref 25.1–34.0)
MCHC: 34.1 g/dL (ref 31.5–36.0)
MCV: 98.4 fL (ref 79.5–101.0)
Monocytes Absolute: 0.3 10*3/uL (ref 0.1–0.9)
Monocytes Relative: 10 %
NEUTROS PCT: 65 %
Neutro Abs: 2 10*3/uL (ref 1.5–6.5)
PLATELETS: 208 10*3/uL (ref 145–400)
RBC: 3.75 MIL/uL (ref 3.70–5.45)
RDW: 16.7 % — ABNORMAL HIGH (ref 11.2–14.5)
WBC: 3.1 10*3/uL — AB (ref 3.9–10.3)

## 2017-09-03 LAB — COMPREHENSIVE METABOLIC PANEL
ALT: 39 U/L (ref 0–55)
AST: 22 U/L (ref 5–34)
Albumin: 4 g/dL (ref 3.5–5.0)
Alkaline Phosphatase: 67 U/L (ref 40–150)
Anion gap: 11 (ref 3–11)
BUN: 10 mg/dL (ref 7–26)
CHLORIDE: 98 mmol/L (ref 98–109)
CO2: 24 mmol/L (ref 22–29)
Calcium: 9.3 mg/dL (ref 8.4–10.4)
Creatinine, Ser: 0.7 mg/dL (ref 0.60–1.10)
Glucose, Bld: 91 mg/dL (ref 70–140)
POTASSIUM: 3.6 mmol/L (ref 3.5–5.1)
Sodium: 133 mmol/L — ABNORMAL LOW (ref 136–145)
Total Bilirubin: 0.3 mg/dL (ref 0.2–1.2)
Total Protein: 7.1 g/dL (ref 6.4–8.3)

## 2017-09-03 MED ORDER — PALONOSETRON HCL INJECTION 0.25 MG/5ML
0.2500 mg | Freq: Once | INTRAVENOUS | Status: AC
  Administered 2017-09-03: 0.25 mg via INTRAVENOUS

## 2017-09-03 MED ORDER — DIPHENHYDRAMINE HCL 50 MG/ML IJ SOLN
INTRAMUSCULAR | Status: AC
  Filled 2017-09-03: qty 1

## 2017-09-03 MED ORDER — PALONOSETRON HCL INJECTION 0.25 MG/5ML
INTRAVENOUS | Status: AC
  Filled 2017-09-03: qty 5

## 2017-09-03 MED ORDER — SODIUM CHLORIDE 0.9 % IV SOLN
258.2000 mg | Freq: Once | INTRAVENOUS | Status: AC
  Administered 2017-09-03: 260 mg via INTRAVENOUS
  Filled 2017-09-03: qty 26

## 2017-09-03 MED ORDER — SODIUM CHLORIDE 0.9% FLUSH
10.0000 mL | INTRAVENOUS | Status: DC | PRN
  Administered 2017-09-03: 10 mL
  Filled 2017-09-03: qty 10

## 2017-09-03 MED ORDER — SODIUM CHLORIDE 0.9 % IV SOLN
Freq: Once | INTRAVENOUS | Status: AC
  Administered 2017-09-03: 14:00:00 via INTRAVENOUS

## 2017-09-03 MED ORDER — HEPARIN SOD (PORK) LOCK FLUSH 100 UNIT/ML IV SOLN
500.0000 [IU] | Freq: Once | INTRAVENOUS | Status: AC | PRN
  Administered 2017-09-03: 500 [IU]
  Filled 2017-09-03: qty 5

## 2017-09-03 MED ORDER — DEXAMETHASONE SODIUM PHOSPHATE 10 MG/ML IJ SOLN
10.0000 mg | Freq: Once | INTRAMUSCULAR | Status: AC
  Administered 2017-09-03: 10 mg via INTRAVENOUS

## 2017-09-03 MED ORDER — SODIUM CHLORIDE 0.9 % IV SOLN
20.0000 mg | Freq: Once | INTRAVENOUS | Status: AC
  Administered 2017-09-03: 20 mg via INTRAVENOUS
  Filled 2017-09-03: qty 2

## 2017-09-03 MED ORDER — DIPHENHYDRAMINE HCL 50 MG/ML IJ SOLN
25.0000 mg | Freq: Once | INTRAMUSCULAR | Status: AC
  Administered 2017-09-03: 25 mg via INTRAVENOUS

## 2017-09-03 MED ORDER — DEXAMETHASONE SODIUM PHOSPHATE 10 MG/ML IJ SOLN
INTRAMUSCULAR | Status: AC
  Filled 2017-09-03: qty 1

## 2017-09-03 MED ORDER — SODIUM CHLORIDE 0.9 % IV SOLN
80.0000 mg/m2 | Freq: Once | INTRAVENOUS | Status: AC
  Administered 2017-09-03: 156 mg via INTRAVENOUS
  Filled 2017-09-03: qty 26

## 2017-09-03 MED ORDER — FAMOTIDINE IN NACL 20-0.9 MG/50ML-% IV SOLN: 20.0000 mg | Freq: Once | INTRAVENOUS | Status: DC

## 2017-09-03 MED ORDER — SODIUM CHLORIDE 0.9% FLUSH
10.0000 mL | Freq: Once | INTRAVENOUS | Status: AC
  Administered 2017-09-03: 10 mL
  Filled 2017-09-03: qty 10

## 2017-09-03 NOTE — Patient Instructions (Signed)
Implanted Port Home Guide An implanted port is a type of central line that is placed under the skin. Central lines are used to provide IV access when treatment or nutrition needs to be given through a person's veins. Implanted ports are used for long-term IV access. An implanted port may be placed because:  You need IV medicine that would be irritating to the small veins in your hands or arms.  You need long-term IV medicines, such as antibiotics.  You need IV nutrition for a long period.  You need frequent blood draws for lab tests.  You need dialysis.  Implanted ports are usually placed in the chest area, but they can also be placed in the upper arm, the abdomen, or the leg. An implanted port has two main parts:  Reservoir. The reservoir is round and will appear as a small, raised area under your skin. The reservoir is the part where a needle is inserted to give medicines or draw blood.  Catheter. The catheter is a thin, flexible tube that extends from the reservoir. The catheter is placed into a large vein. Medicine that is inserted into the reservoir goes into the catheter and then into the vein.  How will I care for my incision site? Do not get the incision site wet. Bathe or shower as directed by your health care provider. How is my port accessed? Special steps must be taken to access the port:  Before the port is accessed, a numbing cream can be placed on the skin. This helps numb the skin over the port site.  Your health care provider uses a sterile technique to access the port. ? Your health care provider must put on a mask and sterile gloves. ? The skin over your port is cleaned carefully with an antiseptic and allowed to dry. ? The port is gently pinched between sterile gloves, and a needle is inserted into the port.  Only "non-coring" port needles should be used to access the port. Once the port is accessed, a blood return should be checked. This helps ensure that the port  is in the vein and is not clogged.  If your port needs to remain accessed for a constant infusion, a clear (transparent) bandage will be placed over the needle site. The bandage and needle will need to be changed every week, or as directed by your health care provider.  Keep the bandage covering the needle clean and dry. Do not get it wet. Follow your health care provider's instructions on how to take a shower or bath while the port is accessed.  If your port does not need to stay accessed, no bandage is needed over the port.  What is flushing? Flushing helps keep the port from getting clogged. Follow your health care provider's instructions on how and when to flush the port. Ports are usually flushed with saline solution or a medicine called heparin. The need for flushing will depend on how the port is used.  If the port is used for intermittent medicines or blood draws, the port will need to be flushed: ? After medicines have been given. ? After blood has been drawn. ? As part of routine maintenance.  If a constant infusion is running, the port may not need to be flushed.  How long will my port stay implanted? The port can stay in for as long as your health care provider thinks it is needed. When it is time for the port to come out, surgery will be   done to remove it. The procedure is similar to the one performed when the port was put in. When should I seek immediate medical care? When you have an implanted port, you should seek immediate medical care if:  You notice a bad smell coming from the incision site.  You have swelling, redness, or drainage at the incision site.  You have more swelling or pain at the port site or the surrounding area.  You have a fever that is not controlled with medicine.  This information is not intended to replace advice given to you by your health care provider. Make sure you discuss any questions you have with your health care provider. Document  Released: 07/02/2005 Document Revised: 12/08/2015 Document Reviewed: 03/09/2013 Elsevier Interactive Patient Education  2017 Elsevier Inc.  

## 2017-09-03 NOTE — Addendum Note (Signed)
Addended by: Chauncey Cruel on: 09/03/2017 01:42 PM   Modules accepted: Orders

## 2017-09-03 NOTE — Patient Instructions (Signed)
[facility] °Discharge Instructions for Patients Receiving Chemotherapy ° °Today you received the following chemotherapy agents Taxol; Carboplatin ° °To help prevent nausea and vomiting after your treatment, we encourage you to take your nausea medication as directed °  °If you develop nausea and vomiting that is not controlled by your nausea medication, call the clinic.  ° °BELOW ARE SYMPTOMS THAT SHOULD BE REPORTED IMMEDIATELY: °· *FEVER GREATER THAN 100.5 F °· *CHILLS WITH OR WITHOUT FEVER °· NAUSEA AND VOMITING THAT IS NOT CONTROLLED WITH YOUR NAUSEA MEDICATION °· *UNUSUAL SHORTNESS OF BREATH °· *UNUSUAL BRUISING OR BLEEDING °· TENDERNESS IN MOUTH AND THROAT WITH OR WITHOUT PRESENCE OF ULCERS °· *URINARY PROBLEMS °· *BOWEL PROBLEMS °· UNUSUAL RASH °Items with * indicate a potential emergency and should be followed up as soon as possible. ° °Feel free to call the clinic should you have any questions or concerns. The clinic phone number is [phone]. ° °Please show the CHEMO ALERT CARD at check-in to the Emergency Department and triage nurse. ° ° °

## 2017-09-04 ENCOUNTER — Telehealth: Payer: Self-pay | Admitting: Oncology

## 2017-09-04 NOTE — Telephone Encounter (Signed)
Per 2/19 no los °

## 2017-09-09 ENCOUNTER — Encounter: Payer: 59 | Admitting: *Deleted

## 2017-09-09 ENCOUNTER — Ambulatory Visit (HOSPITAL_COMMUNITY)
Admission: RE | Admit: 2017-09-09 | Discharge: 2017-09-09 | Disposition: A | Discharge status: Home / Self Care | Payer: 59 | Source: Ambulatory Visit | Attending: Oncology | Admitting: Oncology

## 2017-09-09 ENCOUNTER — Encounter: Payer: Self-pay | Admitting: *Deleted

## 2017-09-09 DIAGNOSIS — C50919 Malignant neoplasm of unspecified site of unspecified female breast: Secondary | ICD-10-CM

## 2017-09-09 DIAGNOSIS — C50412 Malignant neoplasm of upper-outer quadrant of left female breast: Secondary | ICD-10-CM

## 2017-09-09 DIAGNOSIS — Z171 Estrogen receptor negative status [ER-]: Principal | ICD-10-CM

## 2017-09-09 NOTE — Progress Notes (Signed)
09/09/17 at 3:56pm - [employer]- month 3 study visit- The pt had her month 3 cardiac MRI today.  The pt then arrived to the [facility] for her month 3 study assessments.  The pt was given her month 3 self-administeredquestionnaires to complete. The research nurse reviewed the questionnaires forcompleteness and accuracy. The pt scored a "16" on her depression screen. Dr. [name] was notified that the pt met criteria for further evalution of her depression.  Amy Dawson, research assitant, then met with thept and completed the pt's baseline neurocognitive testing. Next, the pt completed her 6 minute walk, disabilities measures, and the expanded short physical performance battery (SPPB). The pt completed all of her assessments in 90 minutes.  The pt was thanked for her cooperation with all of the testing today. The pt was told that she needs to be fasting for 3 hours prior to having her research labs drawn tomorrow.  The pt's month 3 research labs will bedrawn via her port prior to her chemo administration tomorrow. The pt's month 3 vitals will also be obtained for study purposes on Tuesday, 09/10/17.  Brion Aliment RN, BSN, CCRP Clinical Research Nurse 09/09/2017 4:04 PM   09/10/17 at 12:03pm - The pt was into the [facility] today for her treatment and her remaining month 3 assessments.  The pt confirmed that she had been fasting for 3 hours prior to her lab appointment today.  The pt's research labs was drawn today via her port prior to her chemo administration.  The pt's month 3 vitals and height/weight were obtained per the [id number] [id number] protocol.  The pt was seated for a full 5 minutes before her vitals were taken.  The pt and the research nurse reviewed the pt's current medication list together.  The pt reports stopping the following medications on 08/20/17:  homeopathetic products (AZO yeast plus and pumpkin seed/soy), nitrofurantoin, phenazopyridine, and  aspirin/acetaminophen/caffeine.  The pt said that she started  using Flonase (fluticasone) on 07/02/17.  The pt was thanked for her continued support of this trial.  The pt was given her gift card when all of her month 3 assessments were completed.  The pt was told that her next on-study visit will be at her month 12 visit in November 2019.  The research nurse attempted to enter the pt's completed MRI date in the [facility] system.  However, there was an issue entering the completion date of 09/09/17. The research nurse contacted Servando Snare at [facility] about the issue.  The research nurse will fax the MRI Encounter Form to the [facility] reading room and to [facility] [employer] today.   Brion Aliment RN, BSN, CCRP Clinical Research Nurse 09/10/2017 12:09 PM   09/12/17 at 2:20pm - The research nurse received the pt's faxed copy of her month 3 labs today.  Dr. [name] reviewed the pt's baseline labs and compared them to her most recent labs from 09/10/17.  The pt's cholesterol value and her LDL cholesterol have both increased.  He suggested that the pt needs to monitor her diet and may need to see her PCP for a statin prescription.  The pt was in the clinic this afternoon for a routine flush appointment  The research nurse met with the pt this afternoon and reviewed her labs values.  The pt was told that Dr. [name] compared her most recent lab values to her baseline values from November 2018.  The pt was told that Dr. [name] said she may need to see her PCP and  discuss initiation of a statin drug.  The pt said that she would definitely exercise more.  She said that she was not interested in going on a statin drug now.  The pt said that she is very concerned about all of the side effects of statin drugs.  The pt thanked the nurse for discussing her results with her.  The pt was offered a copy of her labs, but the pt declined them.  The pt was told that she would be notified when her MRI clinical report is sent to the site for MD review.   Brion Aliment RN, BSN, CCRP Clinical Research  Nurse 09/12/2017 3:37 PM

## 2017-09-10 ENCOUNTER — Inpatient Hospital Stay: Payer: 59

## 2017-09-10 ENCOUNTER — Telehealth: Payer: Self-pay

## 2017-09-10 ENCOUNTER — Other Ambulatory Visit: Payer: Self-pay

## 2017-09-10 DIAGNOSIS — C50412 Malignant neoplasm of upper-outer quadrant of left female breast: Secondary | ICD-10-CM

## 2017-09-10 DIAGNOSIS — Z171 Estrogen receptor negative status [ER-]: Principal | ICD-10-CM

## 2017-09-10 DIAGNOSIS — Z5189 Encounter for other specified aftercare: Secondary | ICD-10-CM | POA: Diagnosis not present

## 2017-09-10 DIAGNOSIS — Z95828 Presence of other vascular implants and grafts: Secondary | ICD-10-CM

## 2017-09-10 LAB — COMPREHENSIVE METABOLIC PANEL
ALBUMIN: 4 g/dL (ref 3.5–5.0)
ALK PHOS: 61 U/L (ref 40–150)
ALT: 62 U/L — AB (ref 0–55)
AST: 36 U/L — AB (ref 5–34)
Anion gap: 10 (ref 3–11)
BUN: 7 mg/dL (ref 7–26)
CHLORIDE: 101 mmol/L (ref 98–109)
CO2: 24 mmol/L (ref 22–29)
CREATININE: 0.66 mg/dL (ref 0.60–1.10)
Calcium: 9.5 mg/dL (ref 8.4–10.4)
GFR calc non Af Amer: >60 mL/min (ref >60)
GLUCOSE: 87 mg/dL (ref 70–140)
Potassium: 3.7 mmol/L (ref 3.5–5.1)
Sodium: 135 mmol/L — ABNORMAL LOW (ref 136–145)
Total Bilirubin: 0.3 mg/dL (ref 0.2–1.2)
Total Protein: 6.9 g/dL (ref 6.4–8.3)

## 2017-09-10 LAB — CBC WITH DIFFERENTIAL/PLATELET
Basophils Absolute: 0 10*3/uL (ref 0.0–0.1)
Basophils Relative: 1 %
EOS ABS: 0 10*3/uL (ref 0.0–0.5)
Eosinophils Relative: 0 %
HCT: 35.3 % (ref 34.8–46.6)
HEMOGLOBIN: 12 g/dL (ref 11.6–15.9)
LYMPHS ABS: 0.7 10*3/uL — AB (ref 0.9–3.3)
Lymphocytes Relative: 38 %
MCH: 34.2 pg — AB (ref 25.1–34.0)
MCHC: 34 g/dL (ref 31.5–36.0)
MCV: 100.4 fL (ref 79.5–101.0)
MONOS PCT: 9 %
Monocytes Absolute: 0.2 10*3/uL (ref 0.1–0.9)
NEUTROS PCT: 52 %
Neutro Abs: 0.9 10*3/uL — ABNORMAL LOW (ref 1.5–6.5)
Platelets: 197 10*3/uL (ref 145–400)
RBC: 3.51 MIL/uL — ABNORMAL LOW (ref 3.70–5.45)
RDW: 17.4 % — AB (ref 11.2–14.5)
WBC: 1.8 10*3/uL — ABNORMAL LOW (ref 3.9–10.3)

## 2017-09-10 LAB — RESEARCH LABS

## 2017-09-10 MED ORDER — SODIUM CHLORIDE 0.9% FLUSH
10.0000 mL | Freq: Once | INTRAVENOUS | Status: AC
  Administered 2017-09-10: 10 mL
  Filled 2017-09-10: qty 10

## 2017-09-10 MED ORDER — HEPARIN SOD (PORK) LOCK FLUSH 100 UNIT/ML IV SOLN
500.0000 [IU] | Freq: Once | INTRAVENOUS | Status: AC
  Administered 2017-09-10: 500 [IU]
  Filled 2017-09-10: qty 5

## 2017-09-10 NOTE — Progress Notes (Signed)
Pt treatment held today per Dr.[name] for anc 0.9. Pt to come back in 1 week for Lab/MD. Dr.[name] would like for pt to have neupogen today and tomorrow. Per pharmacy, pt may need to come back tomorrow and Thursday to receive neupogen due to insurance authorization time. Pt agreeable and will wait for an updated appt for neupogen injection tomorrow and Thursday.

## 2017-09-10 NOTE — Progress Notes (Signed)
No treatment today due to [place] of 0.9 , Dr.. [name] or kristin will see pt next week and check labs and if [place] is still low will give pt neupogen per May per Dr. [name]. Pt was informed of the plan.

## 2017-09-10 NOTE — Telephone Encounter (Signed)
Called pt to let her know that Granix is still pending prior auth and she will be called in the morning with an appointment here for the medication administration. Pt aware and verbalized understanding and no further needs at this time.  [place] notified.

## 2017-09-11 ENCOUNTER — Inpatient Hospital Stay: Payer: 59

## 2017-09-11 ENCOUNTER — Other Ambulatory Visit: Payer: Self-pay | Admitting: *Deleted

## 2017-09-11 DIAGNOSIS — Z95828 Presence of other vascular implants and grafts: Secondary | ICD-10-CM

## 2017-09-11 DIAGNOSIS — Z171 Estrogen receptor negative status [ER-]: Secondary | ICD-10-CM

## 2017-09-11 DIAGNOSIS — C50412 Malignant neoplasm of upper-outer quadrant of left female breast: Secondary | ICD-10-CM

## 2017-09-11 DIAGNOSIS — Z5189 Encounter for other specified aftercare: Secondary | ICD-10-CM | POA: Diagnosis not present

## 2017-09-11 MED ORDER — TBO-FILGRASTIM 480 MCG/0.8ML ~~LOC~~ SOSY
PREFILLED_SYRINGE | SUBCUTANEOUS | Status: AC
  Filled 2017-09-11: qty 0.8

## 2017-09-11 MED ORDER — TBO-FILGRASTIM 480 MCG/0.8ML ~~LOC~~ SOSY
480.0000 ug | PREFILLED_SYRINGE | Freq: Once | SUBCUTANEOUS | Status: AC
  Administered 2017-09-11: 480 ug via SUBCUTANEOUS

## 2017-09-11 NOTE — Patient Instructions (Signed)
Tbo-Filgrastim injection What is this medicine? TBO-FILGRASTIM (T B O fil GRA stim) is a granulocyte colony-stimulating factor that stimulates the growth of neutrophils, a type of white blood cell important in the body's fight against infection. It is used to reduce the incidence of fever and infection in patients with certain types of cancer who are receiving chemotherapy that affects the bone marrow. This medicine may be used for other purposes; ask your health care provider or pharmacist if you have questions. COMMON BRAND NAME(S): Granix What should I tell my health care provider before I take this medicine? They need to know if you have any of these conditions: -bone scan or tests planned -kidney disease -sickle cell anemia -an unusual or allergic reaction to tbo-filgrastim, filgrastim, pegfilgrastim, other medicines, foods, dyes, or preservatives -pregnant or trying to get pregnant -breast-feeding How should I use this medicine? This medicine is for injection under the skin. If you get this medicine at home, you will be taught how to prepare and give this medicine. Refer to the Instructions for Use that come with your medication packaging. Use exactly as directed. Take your medicine at regular intervals. Do not take your medicine more often than directed. It is important that you put your used needles and syringes in a special sharps container. Do not put them in a trash can. If you do not have a sharps container, call your pharmacist or healthcare provider to get one. Talk to your pediatrician regarding the use of this medicine in children. Special care may be needed. Overdosage: If you think you have taken too much of this medicine contact a poison control center or emergency room at once. NOTE: This medicine is only for you. Do not share this medicine with others. What if I miss a dose? It is important not to miss your dose. Call your doctor or health care professional if you miss a  dose. What may interact with this medicine? This medicine may interact with the following medications: -medicines that may cause a release of neutrophils, such as lithium This list may not describe all possible interactions. Give your health care provider a list of all the medicines, herbs, non-prescription drugs, or dietary supplements you use. Also tell them if you smoke, drink alcohol, or use illegal drugs. Some items may interact with your medicine. What should I watch for while using this medicine? You may need blood work done while you are taking this medicine. What side effects may I notice from receiving this medicine? Side effects that you should report to your doctor or health care professional as soon as possible: -allergic reactions like skin rash, itching or hives, swelling of the face, lips, or tongue -blood in the urine -dark urine -dizziness -fast heartbeat -feeling faint -shortness of breath or breathing problems -signs and symptoms of infection like fever or chills; cough; or sore throat -signs and symptoms of kidney injury like trouble passing urine or change in the amount of urine -stomach or side pain, or pain at the shoulder -sweating -swelling of the legs, ankles, or abdomen -tiredness Side effects that usually do not require medical attention (report to your doctor or health care professional if they continue or are bothersome): -bone pain -headache -muscle pain -vomiting This list may not describe all possible side effects. Call your doctor for medical advice about side effects. You may report side effects to FDA at 1-800-FDA-1088. Where should I keep my medicine? Keep out of the reach of children. Store in a refrigerator between   2 and 8 degrees C (36 and 46 degrees F). Keep in carton to protect from light. Throw away this medicine if it is left out of the refrigerator for more than 5 consecutive days. Throw away any unused medicine after the expiration  date. NOTE: This sheet is a summary. It may not cover all possible information. If you have questions about this medicine, talk to your doctor, pharmacist, or health care provider.  2018 Elsevier/Gold Standard (2015-08-22 19:07:04)  

## 2017-09-12 ENCOUNTER — Inpatient Hospital Stay: Payer: 59

## 2017-09-12 ENCOUNTER — Encounter: Payer: Self-pay | Admitting: General Practice

## 2017-09-12 VITALS — BP 130/78 | HR 100 | Temp 98.0°F | Resp 18

## 2017-09-12 DIAGNOSIS — Z5189 Encounter for other specified aftercare: Secondary | ICD-10-CM | POA: Diagnosis not present

## 2017-09-12 DIAGNOSIS — Z171 Estrogen receptor negative status [ER-]: Secondary | ICD-10-CM

## 2017-09-12 DIAGNOSIS — C50412 Malignant neoplasm of upper-outer quadrant of left female breast: Secondary | ICD-10-CM

## 2017-09-12 DIAGNOSIS — Z95828 Presence of other vascular implants and grafts: Secondary | ICD-10-CM

## 2017-09-12 MED ORDER — TBO-FILGRASTIM 480 MCG/0.8ML ~~LOC~~ SOSY
480.0000 ug | PREFILLED_SYRINGE | Freq: Once | SUBCUTANEOUS | Status: AC
  Administered 2017-09-12: 480 ug via SUBCUTANEOUS

## 2017-09-12 NOTE — Progress Notes (Signed)
[facility] CSW Progress Note  CSW received referral from MD and nurse navigator, requesting follow up on positive screen for depression based on recent self inventory data.  CSW called patient, introduced self and purpose of call.  Patient stated "I am depressed, but I am sleepy right now and am going back to bed."  Patient terminated phone call.  Edwyna Shell, LCSW Clinical Social Worker Phone:  [phone]

## 2017-09-13 ENCOUNTER — Inpatient Hospital Stay: Payer: 59 | Attending: Oncology

## 2017-09-13 VITALS — BP 130/76 | HR 95 | Temp 98.1°F | Resp 18

## 2017-09-13 DIAGNOSIS — Z803 Family history of malignant neoplasm of breast: Secondary | ICD-10-CM | POA: Insufficient documentation

## 2017-09-13 DIAGNOSIS — Z923 Personal history of irradiation: Secondary | ICD-10-CM | POA: Diagnosis not present

## 2017-09-13 DIAGNOSIS — Z808 Family history of malignant neoplasm of other organs or systems: Secondary | ICD-10-CM | POA: Diagnosis not present

## 2017-09-13 DIAGNOSIS — Z809 Family history of malignant neoplasm, unspecified: Secondary | ICD-10-CM | POA: Diagnosis not present

## 2017-09-13 DIAGNOSIS — D701 Agranulocytosis secondary to cancer chemotherapy: Secondary | ICD-10-CM | POA: Diagnosis not present

## 2017-09-13 DIAGNOSIS — Z95828 Presence of other vascular implants and grafts: Secondary | ICD-10-CM

## 2017-09-13 DIAGNOSIS — Z171 Estrogen receptor negative status [ER-]: Secondary | ICD-10-CM | POA: Insufficient documentation

## 2017-09-13 DIAGNOSIS — C50412 Malignant neoplasm of upper-outer quadrant of left female breast: Secondary | ICD-10-CM | POA: Insufficient documentation

## 2017-09-13 DIAGNOSIS — Z87891 Personal history of nicotine dependence: Secondary | ICD-10-CM | POA: Diagnosis not present

## 2017-09-13 DIAGNOSIS — Z5111 Encounter for antineoplastic chemotherapy: Secondary | ICD-10-CM | POA: Insufficient documentation

## 2017-09-13 DIAGNOSIS — Z5189 Encounter for other specified aftercare: Secondary | ICD-10-CM | POA: Diagnosis present

## 2017-09-13 MED ORDER — TBO-FILGRASTIM 480 MCG/0.8ML ~~LOC~~ SOSY
480.0000 ug | PREFILLED_SYRINGE | Freq: Once | SUBCUTANEOUS | Status: AC
  Administered 2017-09-13: 480 ug via SUBCUTANEOUS

## 2017-09-13 MED ORDER — TBO-FILGRASTIM 480 MCG/0.8ML ~~LOC~~ SOSY
PREFILLED_SYRINGE | SUBCUTANEOUS | Status: AC
  Filled 2017-09-13: qty 0.8

## 2017-09-16 ENCOUNTER — Other Ambulatory Visit: Payer: Self-pay

## 2017-09-16 DIAGNOSIS — C50412 Malignant neoplasm of upper-outer quadrant of left female breast: Secondary | ICD-10-CM

## 2017-09-16 DIAGNOSIS — Z171 Estrogen receptor negative status [ER-]: Principal | ICD-10-CM

## 2017-09-17 ENCOUNTER — Inpatient Hospital Stay: Payer: 59

## 2017-09-17 ENCOUNTER — Inpatient Hospital Stay (HOSPITAL_BASED_OUTPATIENT_CLINIC_OR_DEPARTMENT_OTHER): Payer: 59 | Admitting: Adult Health

## 2017-09-17 ENCOUNTER — Ambulatory Visit: Payer: 59 | Admitting: Oncology

## 2017-09-17 ENCOUNTER — Encounter: Payer: Self-pay | Admitting: *Deleted

## 2017-09-17 ENCOUNTER — Encounter: Payer: Self-pay | Admitting: Adult Health

## 2017-09-17 ENCOUNTER — Other Ambulatory Visit: Payer: 59

## 2017-09-17 VITALS — BP 123/73 | HR 85 | Temp 98.4°F | Resp 18 | Ht 69.0 in | Wt 174.0 lb

## 2017-09-17 DIAGNOSIS — Z923 Personal history of irradiation: Secondary | ICD-10-CM

## 2017-09-17 DIAGNOSIS — Z95828 Presence of other vascular implants and grafts: Secondary | ICD-10-CM

## 2017-09-17 DIAGNOSIS — Z5189 Encounter for other specified aftercare: Secondary | ICD-10-CM | POA: Diagnosis not present

## 2017-09-17 DIAGNOSIS — Z87891 Personal history of nicotine dependence: Secondary | ICD-10-CM

## 2017-09-17 DIAGNOSIS — C50412 Malignant neoplasm of upper-outer quadrant of left female breast: Secondary | ICD-10-CM

## 2017-09-17 DIAGNOSIS — Z808 Family history of malignant neoplasm of other organs or systems: Secondary | ICD-10-CM | POA: Diagnosis not present

## 2017-09-17 DIAGNOSIS — Z803 Family history of malignant neoplasm of breast: Secondary | ICD-10-CM | POA: Diagnosis not present

## 2017-09-17 DIAGNOSIS — Z171 Estrogen receptor negative status [ER-]: Principal | ICD-10-CM

## 2017-09-17 LAB — CBC WITH DIFFERENTIAL (CANCER CENTER ONLY)
Basophils Absolute: 0 10*3/uL (ref 0.0–0.1)
Basophils Relative: 1 %
EOS ABS: 0 10*3/uL (ref 0.0–0.5)
Eosinophils Relative: 1 %
HEMATOCRIT: 36.6 % (ref 34.8–46.6)
HEMOGLOBIN: 12 g/dL (ref 11.6–15.9)
LYMPHS ABS: 1.1 10*3/uL (ref 0.9–3.3)
LYMPHS PCT: 32 %
MCH: 33.5 pg (ref 25.1–34.0)
MCHC: 32.8 g/dL (ref 31.5–36.0)
MCV: 102.2 fL — AB (ref 79.5–101.0)
MONOS PCT: 16 %
Monocytes Absolute: 0.5 10*3/uL (ref 0.1–0.9)
NEUTROS PCT: 50 %
Neutro Abs: 1.7 10*3/uL (ref 1.5–6.5)
Platelet Count: 168 10*3/uL (ref 145–400)
RBC: 3.58 MIL/uL — ABNORMAL LOW (ref 3.70–5.45)
RDW: 16.5 % — ABNORMAL HIGH (ref 11.2–14.5)
WBC Count: 3.3 10*3/uL — ABNORMAL LOW (ref 3.9–10.3)

## 2017-09-17 LAB — CMP (CANCER CENTER ONLY)
ALK PHOS: 71 U/L (ref 40–150)
ALT: 43 U/L (ref 0–55)
ANION GAP: 10 (ref 3–11)
AST: 27 U/L (ref 5–34)
Albumin: 3.8 g/dL (ref 3.5–5.0)
BILIRUBIN TOTAL: 0.2 mg/dL (ref 0.2–1.2)
BUN: 5 mg/dL — ABNORMAL LOW (ref 7–26)
CALCIUM: 9.5 mg/dL (ref 8.4–10.4)
CO2: 24 mmol/L (ref 22–29)
CREATININE: 0.68 mg/dL (ref 0.60–1.10)
Chloride: 106 mmol/L (ref 98–109)
Glucose, Bld: 116 mg/dL (ref 70–140)
Potassium: 3.1 mmol/L — ABNORMAL LOW (ref 3.5–5.1)
SODIUM: 140 mmol/L (ref 136–145)
TOTAL PROTEIN: 6.7 g/dL (ref 6.4–8.3)

## 2017-09-17 MED ORDER — DEXAMETHASONE SODIUM PHOSPHATE 10 MG/ML IJ SOLN
INTRAMUSCULAR | Status: AC
  Filled 2017-09-17: qty 1

## 2017-09-17 MED ORDER — FAMOTIDINE IN NACL 20-0.9 MG/50ML-% IV SOLN: 20.0000 mg | Freq: Once | INTRAVENOUS | Status: DC

## 2017-09-17 MED ORDER — SODIUM CHLORIDE 0.9 % IV SOLN
258.2000 mg | Freq: Once | INTRAVENOUS | Status: AC
  Administered 2017-09-17: 260 mg via INTRAVENOUS
  Filled 2017-09-17: qty 26

## 2017-09-17 MED ORDER — SODIUM CHLORIDE 0.9 % IV SOLN
80.0000 mg/m2 | Freq: Once | INTRAVENOUS | Status: AC
  Administered 2017-09-17: 156 mg via INTRAVENOUS
  Filled 2017-09-17: qty 26

## 2017-09-17 MED ORDER — SODIUM CHLORIDE 0.9 % IV SOLN
Freq: Once | INTRAVENOUS | Status: AC
  Administered 2017-09-17: 12:00:00 via INTRAVENOUS

## 2017-09-17 MED ORDER — PALONOSETRON HCL INJECTION 0.25 MG/5ML
INTRAVENOUS | Status: AC
  Filled 2017-09-17: qty 5

## 2017-09-17 MED ORDER — DIPHENHYDRAMINE HCL 50 MG/ML IJ SOLN
25.0000 mg | Freq: Once | INTRAMUSCULAR | Status: AC
  Administered 2017-09-17: 25 mg via INTRAVENOUS

## 2017-09-17 MED ORDER — DEXAMETHASONE SODIUM PHOSPHATE 10 MG/ML IJ SOLN
10.0000 mg | Freq: Once | INTRAMUSCULAR | Status: AC
  Administered 2017-09-17: 10 mg via INTRAVENOUS

## 2017-09-17 MED ORDER — SODIUM CHLORIDE 0.9 % IV SOLN
20.0000 mg | Freq: Once | INTRAVENOUS | Status: AC
  Administered 2017-09-17: 20 mg via INTRAVENOUS
  Filled 2017-09-17: qty 2

## 2017-09-17 MED ORDER — SODIUM CHLORIDE 0.9% FLUSH
10.0000 mL | Freq: Once | INTRAVENOUS | Status: AC
  Administered 2017-09-17: 10 mL
  Filled 2017-09-17: qty 10

## 2017-09-17 MED ORDER — DIPHENHYDRAMINE HCL 50 MG/ML IJ SOLN
INTRAMUSCULAR | Status: AC
  Filled 2017-09-17: qty 1

## 2017-09-17 MED ORDER — PALONOSETRON HCL INJECTION 0.25 MG/5ML
0.2500 mg | Freq: Once | INTRAVENOUS | Status: AC
  Administered 2017-09-17: 0.25 mg via INTRAVENOUS

## 2017-09-17 MED ORDER — HEPARIN SOD (PORK) LOCK FLUSH 100 UNIT/ML IV SOLN
500.0000 [IU] | Freq: Once | INTRAVENOUS | Status: AC | PRN
  Administered 2017-09-17: 500 [IU]
  Filled 2017-09-17: qty 5

## 2017-09-17 MED ORDER — SODIUM CHLORIDE 0.9% FLUSH
10.0000 mL | INTRAVENOUS | Status: DC | PRN
  Administered 2017-09-17: 10 mL
  Filled 2017-09-17: qty 10

## 2017-09-17 NOTE — Progress Notes (Signed)
[facility]  [phone] [phone]     ID: Amy Dawson DOB: Nov 05, 1965  MR#: [id number]  YIA#:[id number]  Patient Care Team: [name], MD as PCP - General (Family Medicine) [name], [name], MD as Consulting Physician (Oncology) [name], MD as Consulting Physician (General Surgery) [name], MD as Consulting Physician (Radiation Oncology) [name], NP as Nurse Practitioner (Obstetrics and Gynecology) OTHER MD:  CHIEF COMPLAINT: Triple negative breast cancer  CURRENT TREATMENT: Neoadjuvant chemotherapy   HISTORY OF CURRENT ILLNESS: From the original intake note:  Amy Dawson felt some discomfort in the left breast in June 2018, but did not think much of it.  However in October she palpated a change in the upper outer quadrant of her left breast and she brought this to medical attention.  On May 09, 2017 she had bilateral diagnostic mammography with tomography at the breast center, showing the breast density to be category capital C.  In the left breast that was not obscured mass in the upper outer quadrant, which by palpation was identified at the 1230 o'clock radiant 5 cm from the nipple.  By ultrasound this measured 2.9 cm, and it was irregular and hypoechoic.  The left axilla was sonographically benign.  Biopsy of the left breast mass in question May 14, 2017 showed [phone]) and invasive ductal carcinoma, grade 2 or more likely 3, estrogen and progesterone receptor negative, with no HER-2 amplification, the signals ratio being 1.18 and the number per cell 1.65.  The key 67 was 80%.  The patient's subsequent history is as detailed below.  INTERVAL HISTORY: Amy Dawson returns today for follow-up and treatment of her triple negative breast cancer. She continues on paclitaxel and carboplatin treatments every 7 days with a dose due today.  She is due for week 7.  She missed her treatment last week due to neutropenia.  She  received Granix injections and her WBC is improved today.    REVIEW OF SYSTEMS: Amy Dawson reports that she is doing well today.  She denies peripheral neuropathy.  She denies fever, chills, nausea, vomiting, constipation, diarrhea, or any other concerns today.     PAST MEDICAL HISTORY: Past Medical History:  Diagnosis Date  . Allergy   . Anxiety   . Breast cancer ([place])   . Chronic kidney disease    bladder issues  . Depression   . Family history of breast cancer   . Fibromyalgia   . Genetic testing 06/13/2017   Breast/GYN panel (23 genes) @ [facility] - No pathogenic mutations detected  . Headache    smokes marijuana for migraine pain  . Murmur, cardiac    as a child  . Stroke ([place]) 2000   numbness on left side  . TMJ (dislocation of temporomandibular joint)     PAST SURGICAL HISTORY: Past Surgical History:  Procedure Laterality Date  . CESAREAN SECTION     2  . ENDOMETRIAL ABLATION  2011  . PORTACATH PLACEMENT Right 05/31/2017   Procedure: INSERTION PORT-A-CATH WITH [place];  Surgeon: [name], MD;  Location: [facility];  Service: General;  Laterality: Right;    FAMILY HISTORY Family History  Problem Relation Age of Onset  . Liver cancer Sister        "rare type"; deceased 31  . Breast cancer Maternal Aunt        Dx 6s; currently 9s  . Breast cancer Paternal Grandmother        Dx 65s; deceased 26s  . Breast  cancer Other        mat grandfather's sister; dx 63s; deceased 34s  . Cancer Paternal Aunt        unk. type; deceased 33  . Colon cancer Neg Hx   . Esophageal cancer Neg Hx   . Rectal cancer Neg Hx   . Stomach cancer Neg Hx   The patient's parents are in their mid 37s as of November 2018.  The patient had 1 brother, 1 sister.  The sister died from a rare liver cancer at age 4.  A maternal aunt and a paternal grandmother were diagnosed with breast cancer in their 91s.  GYNECOLOGIC HISTORY:  No LMP recorded. Patient has had an  ablation. Menarche age 72.  She underwent endometrial ablation 2011.  She is no longer having periods.  She never used hormone replacement.  First live birth was age 27.  She is GX P2.   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  Her husband [name] works as an Firefighter for [employer]  Daughter Kathlean Cinco attends [employer].  Son Dinorah Masullo is doing on apprenticeship in the [place] program.    ADVANCED DIRECTIVES: Not in place   HEALTH MAINTENANCE: Social History   Tobacco Use  . Smoking status: Former Research scientist (life sciences)  . Smokeless tobacco: Never Used  Substance Use Topics  . Alcohol use: Yes    Alcohol/week: 0.0 oz    Comment: sometimes  . Drug use: Yes    Types: Marijuana    Comment: last smoked 05-29-17     Colonoscopy: 2016/[facility]  PAP: October 2018  Bone density: Never   Allergies  Allergen Reactions  . Other Other (See Comments)    Red wine & white cheese: migraine Raw egg whites- hives    Current Outpatient Medications  Medication Sig Dispense Refill  . amitriptyline (ELAVIL) 50 MG tablet Take 25 mg by mouth at bedtime as needed for sleep.    Marland Kitchen azelastine (OPTIVAR) 0.05 % ophthalmic solution INSTILL 1 DROP INTO EACH EYE AS NEEDED  1  . Calcium Glycerophosphate (PRELIEF PO) Take 3 tablets by mouth.    . Cetirizine HCl (ZYRTEC ALLERGY PO) Take by mouth daily.    Marland Kitchen dexamethasone (DECADRON) 4 MG tablet Take 2 tablets by mouth once a day on the day after chemotherapy and then take 2 tablets two times a day for 2 days. Take with food. 30 tablet 1  . dimenhyDRINATE (DRAMAMINE) 50 MG tablet Take 50 mg every 8 (eight) hours as needed by mouth.    . fluticasone (FLONASE) 50 MCG/ACT nasal spray Place 2 sprays into both nostrils daily.    Marland Kitchen LORazepam (ATIVAN) 0.5 MG tablet Take 1 tablet (0.5 mg total) by mouth at bedtime as needed for anxiety. 30 tablet 0  . prochlorperazine (COMPAZINE) 10 MG tablet Take 1 tablet (10 mg  total) by mouth every 6 (six) hours as needed (Nausea or vomiting). 30 tablet 1  . Pseudoephedrine HCl (SUDAFED PO) Take by mouth as needed.    . traMADol (ULTRAM) 50 MG tablet Take 2 tablets (100 mg total) every 6 (six) hours as needed by mouth. 10 tablet 0   No current facility-administered medications for this visit.     OBJECTIVE: Middle-aged white woman who appears well  Vitals:   09/17/17 1130  BP: 123/73  Pulse: 85  Resp: 18  Temp: 98.4 F (36.9 C)  SpO2: 100%     Body mass index is  25.7 kg/m.   Wt Readings from Last 3 Encounters:  09/17/17 174 lb (78.9 kg)  09/10/17 170 lb 11.2 oz (77.4 kg)  09/03/17 170 lb 1.6 oz (77.2 kg)     ECOG FS:1 - Symptomatic but completely ambulatory GENERAL: Patient is a well appearing female in no acute distress HEENT:  Sclerae anicteric.  Oropharynx clear and moist. No ulcerations or evidence of oropharyngeal candidiasis. Neck is supple.  NODES:  No cervical, supraclavicular, or axillary lymphadenopathy palpated.  BREAST EXAM:  Deferred. LUNGS:  Clear to auscultation bilaterally.  No wheezes or rhonchi. HEART:  Regular rate and rhythm. No murmur appreciated. ABDOMEN:  Soft, nontender.  Positive, normoactive bowel sounds. No organomegaly palpated. MSK:  No focal spinal tenderness to palpation. Full range of motion bilaterally in the upper extremities. EXTREMITIES:  No peripheral edema.   SKIN:  Clear with no obvious rashes or skin changes. No nail dyscrasia. NEURO:  Nonfocal. Well oriented.  Appropriate affect.    LAB RESULTS:  CMP     Component Value Date/Time   NA 135 (L) 09/10/2017 1051   NA 138 07/10/2017 1458   K 3.7 09/10/2017 1051   K 3.7 07/10/2017 1458   CL 101 09/10/2017 1051   CO2 24 09/10/2017 1051   CO2 25 07/10/2017 1458   GLUCOSE 87 09/10/2017 1051   GLUCOSE 95 07/10/2017 1458   BUN 7 09/10/2017 1051   BUN 7.4 07/10/2017 1458   CREATININE 0.66 09/10/2017 1051   CREATININE 0.7 07/10/2017 1458   CALCIUM 9.5  09/10/2017 1051   CALCIUM 8.9 07/10/2017 1458   PROT 6.9 09/10/2017 1051   PROT 7.1 07/10/2017 1458   ALBUMIN 4.0 09/10/2017 1051   ALBUMIN 3.8 07/10/2017 1458   AST 36 (H) 09/10/2017 1051   AST 10 07/10/2017 1458   ALT 62 (H) 09/10/2017 1051   ALT 11 07/10/2017 1458   ALKPHOS 61 09/10/2017 1051   ALKPHOS 92 07/10/2017 1458   BILITOT 0.3 09/10/2017 1051   BILITOT <0.22 07/10/2017 1458   GFRNONAA >60 09/10/2017 1051   GFRAA >60 09/10/2017 1051    No results found for: TOTALPROTELP, ALBUMINELP, A1GS, A2GS, BETS, BETA2SER, GAMS, MSPIKE, SPEI  No results found for: Nils Pyle, KAPLAMBRATIO  Lab Results  Component Value Date   WBC 3.3 (L) 09/17/2017   NEUTROABS 1.7 09/17/2017   HGB 12.0 09/10/2017   HCT 36.6 09/17/2017   MCV 102.2 (H) 09/17/2017   PLT 168 09/17/2017      Chemistry      Component Value Date/Time   NA 135 (L) 09/10/2017 1051   NA 138 07/10/2017 1458   K 3.7 09/10/2017 1051   K 3.7 07/10/2017 1458   CL 101 09/10/2017 1051   CO2 24 09/10/2017 1051   CO2 25 07/10/2017 1458   BUN 7 09/10/2017 1051   BUN 7.4 07/10/2017 1458   CREATININE 0.66 09/10/2017 1051   CREATININE 0.7 07/10/2017 1458      Component Value Date/Time   CALCIUM 9.5 09/10/2017 1051   CALCIUM 8.9 07/10/2017 1458   ALKPHOS 61 09/10/2017 1051   ALKPHOS 92 07/10/2017 1458   AST 36 (H) 09/10/2017 1051   AST 10 07/10/2017 1458   ALT 62 (H) 09/10/2017 1051   ALT 11 07/10/2017 1458   BILITOT 0.3 09/10/2017 1051   BILITOT <0.22 07/10/2017 1458       No results found for: LABCA2  No components found for: [id number]  No results for input(s): INR in the last 168 hours.  No results found for: LABCA2  No results found for: JOI786  No results found for: VEH209  No results found for: OBS962  No results found for: CA2729  No components found for: HGQUANT  No results found for: CEA1 / No results found for: CEA1   No results found for: AFPTUMOR  No results found for:  CHROMOGRNA  No results found for: PSA1  Appointment on 09/17/2017  Component Date Value Ref Range Status  . WBC Count 09/17/2017 3.3* 3.9 - 10.3 K/uL Final  . RBC 09/17/2017 3.58* 3.70 - 5.45 MIL/uL Final  . Hemoglobin 09/17/2017 12.0  11.6 - 15.9 g/dL Final  . HCT 09/17/2017 36.6  34.8 - 46.6 % Final  . MCV 09/17/2017 102.2* 79.5 - 101.0 fL Final  . MCH 09/17/2017 33.5  25.1 - 34.0 pg Final  . MCHC 09/17/2017 32.8  31.5 - 36.0 g/dL Final  . RDW 09/17/2017 16.5* 11.2 - 14.5 % Final  . Platelet Count 09/17/2017 168  145 - 400 K/uL Final  . Neutrophils Relative % 09/17/2017 50  % Final  . Neutro Abs 09/17/2017 1.7  1.5 - 6.5 K/uL Final  . Lymphocytes Relative 09/17/2017 32  % Final  . Lymphs Abs 09/17/2017 1.1  0.9 - 3.3 K/uL Final  . Monocytes Relative 09/17/2017 16  % Final  . Monocytes Absolute 09/17/2017 0.5  0.1 - 0.9 K/uL Final  . Eosinophils Relative 09/17/2017 1  % Final  . Eosinophils Absolute 09/17/2017 0.0  0.0 - 0.5 K/uL Final  . Basophils Relative 09/17/2017 1  % Final  . Basophils Absolute 09/17/2017 0.0  0.0 - 0.1 K/uL Final   Performed at [facility], [address] [place], [place] [address]    (this displays the last labs from the last 3 days)  No results found for: TOTALPROTELP, ALBUMINELP, A1GS, A2GS, BETS, BETA2SER, GAMS, MSPIKE, SPEI (this displays SPEP labs)  No results found for: KPAFRELGTCHN, LAMBDASER, KAPLAMBRATIO (kappa/lambda light chains)  No results found for: HGBA, HGBA2QUANT, HGBFQUANT, HGBSQUAN (Hemoglobinopathy evaluation)   No results found for: LDH  No results found for: IRON, TIBC, IRONPCTSAT (Iron and TIBC)  No results found for: FERRITIN  Urinalysis    Component Value Date/Time   COLORURINE YELLOW 07/07/2017 1400   APPEARANCEUR CLEAR 07/07/2017 1400   LABSPEC 1.003 (L) 07/07/2017 1400   LABSPEC 1.010 05/30/2017 1315   PHURINE 7.0 07/07/2017 1400   GLUCOSEU NEGATIVE 07/07/2017 1400   GLUCOSEU  Negative 05/30/2017 1315   HGBUR NEGATIVE 07/07/2017 1400   BILIRUBINUR NEGATIVE 07/07/2017 1400   BILIRUBINUR Color Interference 05/30/2017 1315   KETONESUR NEGATIVE 07/07/2017 1400   PROTEINUR NEGATIVE 07/07/2017 1400   UROBILINOGEN Color Interference 05/30/2017 1315   NITRITE NEGATIVE 07/07/2017 1400   LEUKOCYTESUR NEGATIVE 07/07/2017 1400   LEUKOCYTESUR Color Interference 05/30/2017 1315     STUDIES: No results found.   ELIGIBLE FOR AVAILABLE RESEARCH PROTOCOL: SWOG [id number] depending on response to neoadjuvant treatment, UPBEAT  ASSESSMENT: 52 y.o. [place] status post left breast upper outer quadrant biopsy May 14, 2017 for a clinical T2 N0, stage IIB invasive ductal carcinoma, grade 3, triple negative, with an MIB-1 of 80%  (1) neoadjuvant chemotherapy consisting of doxorubicin and cyclophosphamide in dose dense fashion x4 started 06/04/2017, completed 07/17/2017; followed by weekly carboplatin and paclitaxel x12 started 07/30/2017  (a) echo on 05/30/2017: LVEF 55-60%  (2) breast conserving surgery with sentinel lymph node sampling to follow  (3) adjuvant radiation to follow surgery  (4) genetics testing offered  through [facility] completed on 06/13/2017 showed: No deleterious mutations identified. The following genes were evaluated for sequence changes and exonic deletions/duplications: ATM, BARD1, BRCA1, BRCA2, BRIP1, CDH1, CHEK2, DICER1, EPCAM*, MLH1, MSH2, MSH6, NBN, NF1, PALB2, PMS2, PTEN, RAD50, RAD51C, RAD51D, SMARCA4, STK11, TP53 Results are negative unless otherwise indicated   PLAN: Amy Dawson is doing well today.  Her WBC has improved and her anc has normalized.  She will proceed with chemotherapy today.  She is concerned about the fact that she missed last week.  I gave patient reassurance.  I cautioned her that sometimes with Carboplatin the platelet count may drop as well.  She verbalizes understanding.    She will return in one week for labs and her next  chemotherapy.She will call with any other issues that may develop before then.  A total of (20) minutes of face-to-face time was spent with this patient with greater than 50% of that time in counseling and care-coordination.  Wilber Bihari, NP 09/17/17 11:45 AM Medical Oncology and Hematology [facility] [address] [place],  [address] Tel. [phone]    Fax. [phone]

## 2017-09-17 NOTE — Progress Notes (Signed)
Received notification from Dr. [name] regarding score of "16" on depression sale from research. Discussed with pt. Denies wanting to speak to chaplain or SW. Requested pt to notify if she would like to speak to the above. Pt voiced she was "a little depressed but that was to be expected with cancer treatment". Asked if she is currently taking any anti-depressive medication, was informed she is not currently taking anything. Denies further needs or questions at this time. Discussed with Mendel Ryder NP prior to her seeing pt.

## 2017-09-17 NOTE — Patient Instructions (Signed)
[facility] Discharge Instructions for Patients Receiving Chemotherapy  Today you received the following chemotherapy agents Taxol, Carboplatin  To help prevent nausea and vomiting after your treatment, we encourage you to take your nausea medication as directed  If you develop nausea and vomiting that is not controlled by your nausea medication, call the clinic.   BELOW ARE SYMPTOMS THAT SHOULD BE REPORTED IMMEDIATELY:  *FEVER GREATER THAN 100.5 F  *CHILLS WITH OR WITHOUT FEVER  NAUSEA AND VOMITING THAT IS NOT CONTROLLED WITH YOUR NAUSEA MEDICATION  *UNUSUAL SHORTNESS OF BREATH  *UNUSUAL BRUISING OR BLEEDING  TENDERNESS IN MOUTH AND THROAT WITH OR WITHOUT PRESENCE OF ULCERS  *URINARY PROBLEMS  *BOWEL PROBLEMS  UNUSUAL RASH Items with * indicate a potential emergency and should be followed up as soon as possible.  Feel free to call the clinic should you have any questions or concerns. The clinic phone number is [phone].  Please show the CHEMO ALERT CARD at check-in to the Emergency Department and triage nurse.   

## 2017-09-18 ENCOUNTER — Telehealth: Payer: Self-pay | Admitting: Adult Health

## 2017-09-18 ENCOUNTER — Telehealth: Payer: Self-pay

## 2017-09-18 ENCOUNTER — Other Ambulatory Visit: Payer: Self-pay

## 2017-09-18 MED ORDER — POTASSIUM CHLORIDE ER 10 MEQ PO TBCR: 10.0000 meq | EXTENDED_RELEASE_TABLET | Freq: Every day | ORAL | 0 refills | Status: DC

## 2017-09-18 NOTE — Telephone Encounter (Signed)
-----   Message from Gardenia Phlegm, NP sent at 09/18/2017  2:08 PM EST ----- Patient potassium is slightly low.  Please call in K tab 10 meq po daily days disp 30 no refills.  Tell her to take daily x 5 days.   ----- Message ----- From: Buel Ream, Lab In [place] Sent: 09/17/2017  11:22 AM To: Chauncey Cruel, MD

## 2017-09-18 NOTE — Telephone Encounter (Signed)
Called with below message. Verbalized understanding. 

## 2017-09-18 NOTE — Telephone Encounter (Signed)
Per 3/5 no los °

## 2017-09-20 ENCOUNTER — Encounter: Payer: Self-pay | Admitting: *Deleted

## 2017-09-20 NOTE — Progress Notes (Signed)
09/20/17 at 3:03pm - [employer] month 3 cardiac MRI [facility] Clinical Report- The research nurse received this report today by email. The report mentioned "mild left ventricular hypertrophy. Not an alert finding".  The research nurse took the clinical report to Dr. [name] to review.   He signed the clinical report, and he asked the research nurse to fax Dr. Haroldine Laws, the pt's cardiologist, a copy of the [facility] Clinical Report.  The research nurse faxed Dr. Haroldine Laws the report this afternoon.  Of note, the pt was seen by Dr. Haroldine Laws on 08/26/17 as a new consult in his cardio-oncology clinic.  The pt also had an echo on 08/26/17 which he reviewed.  The research nurse will notify the pt next week during her routine appointment about her cardiac MRI results.   Brion Aliment RN, BSN, CCRP Clinical Research Nurse 09/20/2017 3:08 PM   09/24/17 @3 :25pm- The research nurse met with the pt while she was in the infusion room receiving her planned chemotherapy.  The pt was given a copy of her MRI clinical report for her records.  The pt was informed that all the findings were negative for alert findings.  The pt was told that there was a comment mentioning "milld left ventricular hypertrophy- not an alert value".  The pt was told that Dr. [name] has reviewed the report, and a copy was faxed to Dr. Clayborne Dana (patient's cardiologist) office.  The pt was told that that her MRI was not a full clinical exam and that it only evaluated part of the heart. The pt verbalized understanding.  The pt was told that if she has any questions about the significance of this finding, then she probably needs to contact Dr. Clayborne Dana office.   Brion Aliment RN, BSN, CCRP Clinical Research Nurse 09/24/2017 3:37 PM

## 2017-09-24 ENCOUNTER — Other Ambulatory Visit: Payer: Self-pay | Admitting: Oncology

## 2017-09-24 ENCOUNTER — Inpatient Hospital Stay: Payer: 59

## 2017-09-24 VITALS — BP 132/75 | HR 98 | Temp 98.0°F | Resp 18

## 2017-09-24 DIAGNOSIS — C50412 Malignant neoplasm of upper-outer quadrant of left female breast: Secondary | ICD-10-CM

## 2017-09-24 DIAGNOSIS — Z171 Estrogen receptor negative status [ER-]: Secondary | ICD-10-CM

## 2017-09-24 DIAGNOSIS — Z5189 Encounter for other specified aftercare: Secondary | ICD-10-CM | POA: Diagnosis not present

## 2017-09-24 DIAGNOSIS — Z95828 Presence of other vascular implants and grafts: Secondary | ICD-10-CM

## 2017-09-24 LAB — CBC WITH DIFFERENTIAL/PLATELET
BASOS PCT: 0 %
Basophils Absolute: 0 10*3/uL (ref 0.0–0.1)
Eosinophils Absolute: 0 10*3/uL (ref 0.0–0.5)
Eosinophils Relative: 1 %
HEMATOCRIT: 34.7 % — AB (ref 34.8–46.6)
Hemoglobin: 11.6 g/dL (ref 11.6–15.9)
Lymphocytes Relative: 46 %
Lymphs Abs: 1.1 10*3/uL (ref 0.9–3.3)
MCH: 34.4 pg — ABNORMAL HIGH (ref 25.1–34.0)
MCHC: 33.4 g/dL (ref 31.5–36.0)
MCV: 103 fL — ABNORMAL HIGH (ref 79.5–101.0)
MONO ABS: 0.1 10*3/uL (ref 0.1–0.9)
MONOS PCT: 4 %
NEUTROS ABS: 1.1 10*3/uL — AB (ref 1.5–6.5)
Neutrophils Relative %: 49 %
Platelets: 218 10*3/uL (ref 145–400)
RBC: 3.37 MIL/uL — ABNORMAL LOW (ref 3.70–5.45)
RDW: 15.1 % — AB (ref 11.2–14.5)
WBC: 2.3 10*3/uL — ABNORMAL LOW (ref 3.9–10.3)

## 2017-09-24 LAB — COMPREHENSIVE METABOLIC PANEL
ALT: 42 U/L (ref 14–54)
ANION GAP: 9 (ref 5–15)
AST: 29 U/L (ref 15–41)
Albumin: 3.6 g/dL (ref 3.5–5.0)
Alkaline Phosphatase: 62 U/L (ref 38–126)
BUN: 7 mg/dL (ref 6–20)
CALCIUM: 9.1 mg/dL (ref 8.9–10.3)
CO2: 27 mmol/L (ref 22–32)
Chloride: 101 mmol/L (ref 101–111)
Creatinine, Ser: 0.57 mg/dL (ref 0.44–1.00)
Glucose, Bld: 135 mg/dL — ABNORMAL HIGH (ref 65–99)
POTASSIUM: 3.5 mmol/L (ref 3.5–5.1)
Sodium: 137 mmol/L (ref 135–145)
Total Bilirubin: 0.2 mg/dL — ABNORMAL LOW (ref 0.3–1.2)
Total Protein: 6.7 g/dL (ref 6.5–8.1)

## 2017-09-24 MED ORDER — SODIUM CHLORIDE 0.9 % IV SOLN
80.0000 mg/m2 | Freq: Once | INTRAVENOUS | Status: AC
  Administered 2017-09-24: 156 mg via INTRAVENOUS
  Filled 2017-09-24: qty 26

## 2017-09-24 MED ORDER — DIPHENHYDRAMINE HCL 50 MG/ML IJ SOLN
INTRAMUSCULAR | Status: AC
  Filled 2017-09-24: qty 1

## 2017-09-24 MED ORDER — DEXAMETHASONE SODIUM PHOSPHATE 10 MG/ML IJ SOLN
10.0000 mg | Freq: Once | INTRAMUSCULAR | Status: AC
  Administered 2017-09-24: 10 mg via INTRAVENOUS

## 2017-09-24 MED ORDER — CARBOPLATIN CHEMO INJECTION 450 MG/45ML
258.2000 mg | Freq: Once | INTRAVENOUS | Status: AC
  Administered 2017-09-24: 260 mg via INTRAVENOUS
  Filled 2017-09-24: qty 26

## 2017-09-24 MED ORDER — DEXAMETHASONE SODIUM PHOSPHATE 10 MG/ML IJ SOLN
INTRAMUSCULAR | Status: AC
Start: 2017-09-24 — End: 2017-09-24
  Filled 2017-09-24: qty 1

## 2017-09-24 MED ORDER — SODIUM CHLORIDE 0.9% FLUSH
10.0000 mL | Freq: Once | INTRAVENOUS | Status: AC
  Administered 2017-09-24: 10 mL
  Filled 2017-09-24: qty 10

## 2017-09-24 MED ORDER — SODIUM CHLORIDE 0.9 % IV SOLN
Freq: Once | INTRAVENOUS | Status: AC
  Administered 2017-09-24: 13:00:00 via INTRAVENOUS

## 2017-09-24 MED ORDER — SODIUM CHLORIDE 0.9% FLUSH
10.0000 mL | INTRAVENOUS | Status: DC | PRN
  Administered 2017-09-24: 10 mL
  Filled 2017-09-24: qty 10

## 2017-09-24 MED ORDER — FAMOTIDINE IN NACL 20-0.9 MG/50ML-% IV SOLN: 20.0000 mg | Freq: Once | INTRAVENOUS | Status: DC

## 2017-09-24 MED ORDER — HEPARIN SOD (PORK) LOCK FLUSH 100 UNIT/ML IV SOLN
500.0000 [IU] | Freq: Once | INTRAVENOUS | Status: AC | PRN
  Administered 2017-09-24: 500 [IU]
  Filled 2017-09-24: qty 5

## 2017-09-24 MED ORDER — PALONOSETRON HCL INJECTION 0.25 MG/5ML
0.2500 mg | Freq: Once | INTRAVENOUS | Status: AC
  Administered 2017-09-24: 0.25 mg via INTRAVENOUS

## 2017-09-24 MED ORDER — PALONOSETRON HCL INJECTION 0.25 MG/5ML
INTRAVENOUS | Status: AC
Start: 2017-09-24 — End: 2017-09-24
  Filled 2017-09-24: qty 5

## 2017-09-24 MED ORDER — DIPHENHYDRAMINE HCL 50 MG/ML IJ SOLN
25.0000 mg | Freq: Once | INTRAMUSCULAR | Status: AC
Start: 2017-09-24 — End: 2017-09-24
  Administered 2017-09-24: 25 mg via INTRAVENOUS

## 2017-09-24 MED ORDER — PALONOSETRON HCL INJECTION 0.25 MG/5ML
INTRAVENOUS | Status: AC
  Filled 2017-09-24: qty 5

## 2017-09-24 MED ORDER — SODIUM CHLORIDE 0.9 % IV SOLN
20.0000 mg | Freq: Once | INTRAVENOUS | Status: AC
  Administered 2017-09-24: 20 mg via INTRAVENOUS
  Filled 2017-09-24: qty 2

## 2017-09-24 NOTE — Patient Instructions (Signed)
[facility] Discharge Instructions for Patients Receiving Chemotherapy  Today you received the following chemotherapy agents taxol/carboplatin  To help prevent nausea and vomiting after your treatment, we encourage you to take your nausea medication as directed   If you develop nausea and vomiting that is not controlled by your nausea medication, call the clinic.   BELOW ARE SYMPTOMS THAT SHOULD BE REPORTED IMMEDIATELY:  *FEVER GREATER THAN 100.5 F  *CHILLS WITH OR WITHOUT FEVER  NAUSEA AND VOMITING THAT IS NOT CONTROLLED WITH YOUR NAUSEA MEDICATION  *UNUSUAL SHORTNESS OF BREATH  *UNUSUAL BRUISING OR BLEEDING  TENDERNESS IN MOUTH AND THROAT WITH OR WITHOUT PRESENCE OF ULCERS  *URINARY PROBLEMS  *BOWEL PROBLEMS  UNUSUAL RASH Items with * indicate a potential emergency and should be followed up as soon as possible.  Feel free to call the clinic you have any questions or concerns. The clinic phone number is [phone].  

## 2017-09-24 NOTE — Progress Notes (Signed)
Ok to treat with ANC 1.1 per Dr. [name]

## 2017-09-25 ENCOUNTER — Inpatient Hospital Stay: Payer: 59

## 2017-09-25 ENCOUNTER — Other Ambulatory Visit: Payer: Self-pay

## 2017-09-25 ENCOUNTER — Other Ambulatory Visit: Payer: Self-pay | Admitting: *Deleted

## 2017-09-25 VITALS — BP 130/87 | HR 98 | Temp 98.1°F | Resp 18

## 2017-09-25 DIAGNOSIS — Z171 Estrogen receptor negative status [ER-]: Principal | ICD-10-CM

## 2017-09-25 DIAGNOSIS — Z5189 Encounter for other specified aftercare: Secondary | ICD-10-CM | POA: Diagnosis not present

## 2017-09-25 DIAGNOSIS — C50412 Malignant neoplasm of upper-outer quadrant of left female breast: Secondary | ICD-10-CM

## 2017-09-25 DIAGNOSIS — D709 Neutropenia, unspecified: Secondary | ICD-10-CM

## 2017-09-25 DIAGNOSIS — Z95828 Presence of other vascular implants and grafts: Secondary | ICD-10-CM

## 2017-09-25 MED ORDER — TBO-FILGRASTIM 480 MCG/0.8ML ~~LOC~~ SOSY
480.0000 ug | PREFILLED_SYRINGE | Freq: Once | SUBCUTANEOUS | Status: AC
  Administered 2017-09-25: 480 ug via SUBCUTANEOUS

## 2017-09-25 MED ORDER — TBO-FILGRASTIM 300 MCG/0.5ML ~~LOC~~ SOSY: 300.0000 ug | PREFILLED_SYRINGE | Freq: Once | SUBCUTANEOUS | Status: DC

## 2017-09-25 MED ORDER — TBO-FILGRASTIM 480 MCG/0.8ML ~~LOC~~ SOSY
PREFILLED_SYRINGE | SUBCUTANEOUS | Status: AC
  Filled 2017-09-25: qty 0.8

## 2017-09-26 ENCOUNTER — Inpatient Hospital Stay: Payer: 59

## 2017-09-26 DIAGNOSIS — Z171 Estrogen receptor negative status [ER-]: Secondary | ICD-10-CM

## 2017-09-26 DIAGNOSIS — Z5189 Encounter for other specified aftercare: Secondary | ICD-10-CM | POA: Diagnosis not present

## 2017-09-26 DIAGNOSIS — C50412 Malignant neoplasm of upper-outer quadrant of left female breast: Secondary | ICD-10-CM

## 2017-09-26 DIAGNOSIS — Z95828 Presence of other vascular implants and grafts: Secondary | ICD-10-CM

## 2017-09-26 MED ORDER — TBO-FILGRASTIM 480 MCG/0.8ML ~~LOC~~ SOSY
480.0000 ug | PREFILLED_SYRINGE | Freq: Once | SUBCUTANEOUS | Status: AC
  Administered 2017-09-26: 480 ug via SUBCUTANEOUS

## 2017-09-26 NOTE — Patient Instructions (Signed)
Tbo-Filgrastim injection What is this medicine? TBO-FILGRASTIM (T B O fil GRA stim) is a granulocyte colony-stimulating factor that stimulates the growth of neutrophils, a type of white blood cell important in the body's fight against infection. It is used to reduce the incidence of fever and infection in patients with certain types of cancer who are receiving chemotherapy that affects the bone marrow. This medicine may be used for other purposes; ask your health care provider or pharmacist if you have questions. COMMON BRAND NAME(S): Granix What should I tell my health care provider before I take this medicine? They need to know if you have any of these conditions: -bone scan or tests planned -kidney disease -sickle cell anemia -an unusual or allergic reaction to tbo-filgrastim, filgrastim, pegfilgrastim, other medicines, foods, dyes, or preservatives -pregnant or trying to get pregnant -breast-feeding How should I use this medicine? This medicine is for injection under the skin. If you get this medicine at home, you will be taught how to prepare and give this medicine. Refer to the Instructions for Use that come with your medication packaging. Use exactly as directed. Take your medicine at regular intervals. Do not take your medicine more often than directed. It is important that you put your used needles and syringes in a special sharps container. Do not put them in a trash can. If you do not have a sharps container, call your pharmacist or healthcare provider to get one. Talk to your pediatrician regarding the use of this medicine in children. Special care may be needed. Overdosage: If you think you have taken too much of this medicine contact a poison control center or emergency room at once. NOTE: This medicine is only for you. Do not share this medicine with others. What if I miss a dose? It is important not to miss your dose. Call your doctor or health care professional if you miss a  dose. What may interact with this medicine? This medicine may interact with the following medications: -medicines that may cause a release of neutrophils, such as lithium This list may not describe all possible interactions. Give your health care provider a list of all the medicines, herbs, non-prescription drugs, or dietary supplements you use. Also tell them if you smoke, drink alcohol, or use illegal drugs. Some items may interact with your medicine. What should I watch for while using this medicine? You may need blood work done while you are taking this medicine. What side effects may I notice from receiving this medicine? Side effects that you should report to your doctor or health care professional as soon as possible: -allergic reactions like skin rash, itching or hives, swelling of the face, lips, or tongue -blood in the urine -dark urine -dizziness -fast heartbeat -feeling faint -shortness of breath or breathing problems -signs and symptoms of infection like fever or chills; cough; or sore throat -signs and symptoms of kidney injury like trouble passing urine or change in the amount of urine -stomach or side pain, or pain at the shoulder -sweating -swelling of the legs, ankles, or abdomen -tiredness Side effects that usually do not require medical attention (report to your doctor or health care professional if they continue or are bothersome): -bone pain -headache -muscle pain -vomiting This list may not describe all possible side effects. Call your doctor for medical advice about side effects. You may report side effects to FDA at 1-800-FDA-1088. Where should I keep my medicine? Keep out of the reach of children. Store in a refrigerator between   2 and 8 degrees C (36 and 46 degrees F). Keep in carton to protect from light. Throw away this medicine if it is left out of the refrigerator for more than 5 consecutive days. Throw away any unused medicine after the expiration  date. NOTE: This sheet is a summary. It may not cover all possible information. If you have questions about this medicine, talk to your doctor, pharmacist, or health care provider.  2018 Elsevier/Gold Standard (2015-08-22 19:07:04)  

## 2017-09-27 ENCOUNTER — Inpatient Hospital Stay: Payer: 59

## 2017-09-27 DIAGNOSIS — Z5189 Encounter for other specified aftercare: Secondary | ICD-10-CM | POA: Diagnosis not present

## 2017-09-27 DIAGNOSIS — Z95828 Presence of other vascular implants and grafts: Secondary | ICD-10-CM

## 2017-09-27 DIAGNOSIS — Z171 Estrogen receptor negative status [ER-]: Secondary | ICD-10-CM

## 2017-09-27 DIAGNOSIS — C50412 Malignant neoplasm of upper-outer quadrant of left female breast: Secondary | ICD-10-CM

## 2017-09-27 MED ORDER — TBO-FILGRASTIM 480 MCG/0.8ML ~~LOC~~ SOSY
PREFILLED_SYRINGE | SUBCUTANEOUS | Status: AC
Start: 2017-09-27 — End: 2017-09-27
  Filled 2017-09-27: qty 0.8

## 2017-09-27 MED ORDER — TBO-FILGRASTIM 480 MCG/0.8ML ~~LOC~~ SOSY
480.0000 ug | PREFILLED_SYRINGE | Freq: Once | SUBCUTANEOUS | Status: AC
  Administered 2017-09-27: 480 ug via SUBCUTANEOUS

## 2017-09-30 ENCOUNTER — Other Ambulatory Visit: Payer: Self-pay | Admitting: *Deleted

## 2017-09-30 DIAGNOSIS — Z171 Estrogen receptor negative status [ER-]: Principal | ICD-10-CM

## 2017-09-30 DIAGNOSIS — C50412 Malignant neoplasm of upper-outer quadrant of left female breast: Secondary | ICD-10-CM

## 2017-10-01 ENCOUNTER — Other Ambulatory Visit: Payer: 59

## 2017-10-01 ENCOUNTER — Telehealth: Payer: Self-pay | Admitting: Adult Health

## 2017-10-01 ENCOUNTER — Inpatient Hospital Stay: Payer: 59

## 2017-10-01 ENCOUNTER — Encounter: Payer: Self-pay | Admitting: General Practice

## 2017-10-01 ENCOUNTER — Inpatient Hospital Stay (HOSPITAL_BASED_OUTPATIENT_CLINIC_OR_DEPARTMENT_OTHER): Payer: 59 | Admitting: Adult Health

## 2017-10-01 ENCOUNTER — Encounter: Payer: Self-pay | Admitting: Adult Health

## 2017-10-01 VITALS — BP 127/77 | HR 97 | Temp 98.3°F | Resp 18 | Ht 69.0 in | Wt 176.1 lb

## 2017-10-01 DIAGNOSIS — C50412 Malignant neoplasm of upper-outer quadrant of left female breast: Secondary | ICD-10-CM

## 2017-10-01 DIAGNOSIS — D701 Agranulocytosis secondary to cancer chemotherapy: Secondary | ICD-10-CM

## 2017-10-01 DIAGNOSIS — Z171 Estrogen receptor negative status [ER-]: Principal | ICD-10-CM

## 2017-10-01 DIAGNOSIS — Z5189 Encounter for other specified aftercare: Secondary | ICD-10-CM | POA: Diagnosis not present

## 2017-10-01 LAB — CMP (CANCER CENTER ONLY)
ALBUMIN: 3.4 g/dL — AB (ref 3.5–5.0)
ALK PHOS: 79 U/L (ref 40–150)
ALT: 48 U/L (ref 0–55)
ANION GAP: 9 (ref 3–11)
AST: 32 U/L (ref 5–34)
BUN: 4 mg/dL — ABNORMAL LOW (ref 7–26)
CALCIUM: 9.2 mg/dL (ref 8.4–10.4)
CO2: 24 mmol/L (ref 22–29)
CREATININE: 0.73 mg/dL (ref 0.60–1.10)
Chloride: 104 mmol/L (ref 98–109)
GFR, Est AFR Am: >60 mL/min (ref >60)
GFR, Estimated: >60 mL/min (ref >60)
GLUCOSE: 155 mg/dL — AB (ref 70–140)
Potassium: 3.6 mmol/L (ref 3.5–5.1)
Sodium: 137 mmol/L (ref 136–145)
TOTAL PROTEIN: 6.5 g/dL (ref 6.4–8.3)

## 2017-10-01 LAB — CBC WITH DIFFERENTIAL/PLATELET
BASOS PCT: 1 %
Basophils Absolute: 0 10*3/uL (ref 0.0–0.1)
Eosinophils Absolute: 0 10*3/uL (ref 0.0–0.5)
Eosinophils Relative: 1 %
HEMATOCRIT: 35.9 % (ref 34.8–46.6)
HEMOGLOBIN: 11.9 g/dL (ref 11.6–15.9)
Lymphocytes Relative: 34 %
Lymphs Abs: 1.1 10*3/uL (ref 0.9–3.3)
MCH: 34.9 pg — AB (ref 25.1–34.0)
MCHC: 33.3 g/dL (ref 31.5–36.0)
MCV: 105 fL — ABNORMAL HIGH (ref 79.5–101.0)
MONOS PCT: 12 %
Monocytes Absolute: 0.4 10*3/uL (ref 0.1–0.9)
NEUTROS ABS: 1.8 10*3/uL (ref 1.5–6.5)
NEUTROS PCT: 52 %
Platelets: 301 10*3/uL (ref 145–400)
RBC: 3.41 MIL/uL — ABNORMAL LOW (ref 3.70–5.45)
RDW: 16.5 % — ABNORMAL HIGH (ref 11.2–14.5)
WBC: 3.4 10*3/uL — ABNORMAL LOW (ref 3.9–10.3)

## 2017-10-01 MED ORDER — FAMOTIDINE IN NACL 20-0.9 MG/50ML-% IV SOLN: 20.0000 mg | Freq: Once | INTRAVENOUS | Status: DC

## 2017-10-01 MED ORDER — PALONOSETRON HCL INJECTION 0.25 MG/5ML
0.2500 mg | Freq: Once | INTRAVENOUS | Status: AC
  Administered 2017-10-01: 0.25 mg via INTRAVENOUS

## 2017-10-01 MED ORDER — DEXAMETHASONE SODIUM PHOSPHATE 10 MG/ML IJ SOLN
INTRAMUSCULAR | Status: AC
Start: 2017-10-01 — End: 2017-10-01
  Filled 2017-10-01: qty 1

## 2017-10-01 MED ORDER — SODIUM CHLORIDE 0.9 % IV SOLN
258.2000 mg | Freq: Once | INTRAVENOUS | Status: AC
  Administered 2017-10-01: 260 mg via INTRAVENOUS
  Filled 2017-10-01: qty 26

## 2017-10-01 MED ORDER — SODIUM CHLORIDE 0.9 % IV SOLN
20.0000 mg | Freq: Once | INTRAVENOUS | Status: AC
  Administered 2017-10-01: 20 mg via INTRAVENOUS
  Filled 2017-10-01: qty 2

## 2017-10-01 MED ORDER — HEPARIN SOD (PORK) LOCK FLUSH 100 UNIT/ML IV SOLN
500.0000 [IU] | Freq: Once | INTRAVENOUS | Status: AC | PRN
  Administered 2017-10-01: 500 [IU]
  Filled 2017-10-01: qty 5

## 2017-10-01 MED ORDER — DIPHENHYDRAMINE HCL 50 MG/ML IJ SOLN
25.0000 mg | Freq: Once | INTRAMUSCULAR | Status: AC
  Administered 2017-10-01: 25 mg via INTRAVENOUS

## 2017-10-01 MED ORDER — DEXAMETHASONE SODIUM PHOSPHATE 10 MG/ML IJ SOLN
10.0000 mg | Freq: Once | INTRAMUSCULAR | Status: AC
  Administered 2017-10-01: 10 mg via INTRAVENOUS

## 2017-10-01 MED ORDER — SODIUM CHLORIDE 0.9 % IV SOLN
Freq: Once | INTRAVENOUS | Status: AC
  Administered 2017-10-01: 12:00:00 via INTRAVENOUS

## 2017-10-01 MED ORDER — SODIUM CHLORIDE 0.9% FLUSH
10.0000 mL | INTRAVENOUS | Status: DC | PRN
  Administered 2017-10-01: 10 mL
  Filled 2017-10-01: qty 10

## 2017-10-01 MED ORDER — PALONOSETRON HCL INJECTION 0.25 MG/5ML
INTRAVENOUS | Status: AC
Start: 2017-10-01 — End: 2017-10-01
  Filled 2017-10-01: qty 5

## 2017-10-01 MED ORDER — SODIUM CHLORIDE 0.9 % IV SOLN
80.0000 mg/m2 | Freq: Once | INTRAVENOUS | Status: AC
  Administered 2017-10-01: 156 mg via INTRAVENOUS
  Filled 2017-10-01: qty 26

## 2017-10-01 MED ORDER — DIPHENHYDRAMINE HCL 50 MG/ML IJ SOLN
INTRAMUSCULAR | Status: AC
  Filled 2017-10-01: qty 1

## 2017-10-01 NOTE — Progress Notes (Signed)
[facility] CSW Progress Note  CSW checked in w patient in infusion room.  Patient denies depressive symptoms, "I am just looking forward to the end of chemo."  Relates she has 3 treatments left, then plans to celebrate by going to the beach w her family including college age children.  No needs at this time, patient aware of resources available via [employer] staff.  CSW signing off, please reconsult if needs arise in future.  Edwyna Shell, LCSW Clinical Social Worker Phone:  [phone]

## 2017-10-01 NOTE — Patient Instructions (Signed)
Implanted Port Home Guide An implanted port is a type of central line that is placed under the skin. Central lines are used to provide IV access when treatment or nutrition needs to be given through a person's veins. Implanted ports are used for long-term IV access. An implanted port may be placed because:  You need IV medicine that would be irritating to the small veins in your hands or arms.  You need long-term IV medicines, such as antibiotics.  You need IV nutrition for a long period.  You need frequent blood draws for lab tests.  You need dialysis.  Implanted ports are usually placed in the chest area, but they can also be placed in the upper arm, the abdomen, or the leg. An implanted port has two main parts:  Reservoir. The reservoir is round and will appear as a small, raised area under your skin. The reservoir is the part where a needle is inserted to give medicines or draw blood.  Catheter. The catheter is a thin, flexible tube that extends from the reservoir. The catheter is placed into a large vein. Medicine that is inserted into the reservoir goes into the catheter and then into the vein.  How will I care for my incision site? Do not get the incision site wet. Bathe or shower as directed by your health care provider. How is my port accessed? Special steps must be taken to access the port:  Before the port is accessed, a numbing cream can be placed on the skin. This helps numb the skin over the port site.  Your health care provider uses a sterile technique to access the port. ? Your health care provider must put on a mask and sterile gloves. ? The skin over your port is cleaned carefully with an antiseptic and allowed to dry. ? The port is gently pinched between sterile gloves, and a needle is inserted into the port.  Only "non-coring" port needles should be used to access the port. Once the port is accessed, a blood return should be checked. This helps ensure that the port  is in the vein and is not clogged.  If your port needs to remain accessed for a constant infusion, a clear (transparent) bandage will be placed over the needle site. The bandage and needle will need to be changed every week, or as directed by your health care provider.  Keep the bandage covering the needle clean and dry. Do not get it wet. Follow your health care provider's instructions on how to take a shower or bath while the port is accessed.  If your port does not need to stay accessed, no bandage is needed over the port.  What is flushing? Flushing helps keep the port from getting clogged. Follow your health care provider's instructions on how and when to flush the port. Ports are usually flushed with saline solution or a medicine called heparin. The need for flushing will depend on how the port is used.  If the port is used for intermittent medicines or blood draws, the port will need to be flushed: ? After medicines have been given. ? After blood has been drawn. ? As part of routine maintenance.  If a constant infusion is running, the port may not need to be flushed.  How long will my port stay implanted? The port can stay in for as long as your health care provider thinks it is needed. When it is time for the port to come out, surgery will be   done to remove it. The procedure is similar to the one performed when the port was put in. When should I seek immediate medical care? When you have an implanted port, you should seek immediate medical care if:  You notice a bad smell coming from the incision site.  You have swelling, redness, or drainage at the incision site.  You have more swelling or pain at the port site or the surrounding area.  You have a fever that is not controlled with medicine.  This information is not intended to replace advice given to you by your health care provider. Make sure you discuss any questions you have with your health care provider. Document  Released: 07/02/2005 Document Revised: 12/08/2015 Document Reviewed: 03/09/2013 Elsevier Interactive Patient Education  2017 Elsevier Inc.  

## 2017-10-01 NOTE — Telephone Encounter (Signed)
Gave patient AVs and calendar of upcoming march appointments

## 2017-10-01 NOTE — Progress Notes (Signed)
[facility]  [phone] [phone]) [phone]     ID: Amy Dawson DOB: Feb 19, 1966  MR#: [id number]  QIO#:[id number]  Patient Care Team: [name], MD as PCP - General (Family Medicine) [name], [name], MD as Consulting Physician (Oncology) [name], MD as Consulting Physician (General Surgery) [name], MD as Consulting Physician (Radiation Oncology) [name], NP as Nurse Practitioner (Obstetrics and Gynecology) OTHER MD:  CHIEF COMPLAINT: Triple negative breast cancer  CURRENT TREATMENT: Neoadjuvant chemotherapy   HISTORY OF CURRENT ILLNESS: From the original intake note:  Amy Dawson felt some discomfort in the left breast in June 2018, but did not think much of it.  However in October she palpated a change in the upper outer quadrant of her left breast and she brought this to medical attention.  On May 09, 2017 she had bilateral diagnostic mammography with tomography at the breast center, showing the breast density to be category capital C.  In the left breast that was not obscured mass in the upper outer quadrant, which by palpation was identified at the 1230 o'clock radiant 5 cm from the nipple.  By ultrasound this measured 2.9 cm, and it was irregular and hypoechoic.  The left axilla was sonographically benign.  Biopsy of the left breast mass in question May 14, 2017 showed [phone]) and invasive ductal carcinoma, grade 2 or more likely 3, estrogen and progesterone receptor negative, with no HER-2 amplification, the signals ratio being 1.18 and the number per cell 1.65.  The key 67 was 80%.  The patient's subsequent history is as detailed below.  INTERVAL HISTORY: Amy Dawson returns today for follow-up and treatment of her triple negative breast cancer. She continues on paclitaxel and carboplatin treatments every 7 days with a dose due today.  She is due for week 9.  She has missed her treatment previously for neutropenia.   She receives Granix after every treatment to prevent further treatment delays due to chemotherapy induced neutropenia.    REVIEW OF SYSTEMS: Amy Dawson tells me that she is doing well today.  She denies peripheral neuropathy.  She denies fever, chills, nausea, vomiting, constipation, diarrhea, or any other concerns today.     PAST MEDICAL HISTORY: Past Medical History:  Diagnosis Date  . Allergy   . Anxiety   . Breast cancer ([place])   . Chronic kidney disease    bladder issues  . Depression   . Family history of breast cancer   . Fibromyalgia   . Genetic testing 06/13/2017   Breast/GYN panel (23 genes) @ [facility] - No pathogenic mutations detected  . Headache    smokes marijuana for migraine pain  . Murmur, cardiac    as a child  . Stroke ([place]) 2000   numbness on left side  . TMJ (dislocation of temporomandibular joint)     PAST SURGICAL HISTORY: Past Surgical History:  Procedure Laterality Date  . CESAREAN SECTION     2  . ENDOMETRIAL ABLATION  2011  . PORTACATH PLACEMENT Right 05/31/2017   Procedure: INSERTION PORT-A-CATH WITH [place];  Surgeon: [name], MD;  Location: [facility];  Service: General;  Laterality: Right;    FAMILY HISTORY Family History  Problem Relation Age of Onset  . Liver cancer Sister        "rare type"; deceased 65  . Breast cancer Maternal Aunt        Dx 3s; currently 37s  . Breast cancer Paternal Grandmother        Dx  78s; deceased 51s  . Breast cancer Other        mat grandfather's sister; dx 73s; deceased 44s  . Cancer Paternal Aunt        unk. type; deceased 36  . Colon cancer Neg Hx   . Esophageal cancer Neg Hx   . Rectal cancer Neg Hx   . Stomach cancer Neg Hx   The patient's parents are in their mid 78s as of November 2018.  The patient had 1 brother, 1 sister.  The sister died from a rare liver cancer at age 8.  A maternal aunt and a paternal grandmother were diagnosed with breast cancer in their 54s.  GYNECOLOGIC  HISTORY:  No LMP recorded. Patient has had an ablation. Menarche age 49.  She underwent endometrial ablation 2011.  She is no longer having periods.  She never used hormone replacement.  First live birth was age 52.  She is GX P2.   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  Her husband [name] works as an Firefighter for [employer]  Daughter Livie Vanderhoof attends [employer].  Son Suprina Mandeville is doing on apprenticeship in the [employer] program.    ADVANCED DIRECTIVES: Not in place   HEALTH MAINTENANCE: Social History   Tobacco Use  . Smoking status: Former Research scientist (life sciences)  . Smokeless tobacco: Never Used  Substance Use Topics  . Alcohol use: Yes    Alcohol/week: 0.0 oz    Comment: sometimes  . Drug use: Yes    Types: Marijuana    Comment: last smoked 05-29-17     Colonoscopy: 2016/[facility]  PAP: October 2018  Bone density: Never   Allergies  Allergen Reactions  . Other Other (See Comments)    Red wine & white cheese: migraine Raw egg whites- hives    Current Outpatient Medications  Medication Sig Dispense Refill  . amitriptyline (ELAVIL) 50 MG tablet Take 25 mg by mouth at bedtime as needed for sleep.    [place] Kitchen azelastine (OPTIVAR) 0.05 % ophthalmic solution INSTILL 1 DROP INTO EACH EYE AS NEEDED  1  . Calcium Glycerophosphate (PRELIEF PO) Take 2 tablets by mouth daily.     . Cetirizine HCl (ZYRTEC ALLERGY PO) Take by mouth daily.    [place] Kitchen dexamethasone (DECADRON) 4 MG tablet Take 2 tablets by mouth once a day on the day after chemotherapy and then take 2 tablets two times a day for 2 days. Take with food. 30 tablet 1  . dimenhyDRINATE (DRAMAMINE) 50 MG tablet Take 50 mg every 8 (eight) hours as needed by mouth.    . fluticasone (FLONASE) 50 MCG/ACT nasal spray Place 2 sprays into both nostrils daily.    [place] Kitchen LORazepam (ATIVAN) 0.5 MG tablet Take 1 tablet (0.5 mg total) by mouth at bedtime as needed for anxiety. 30 tablet 0  . potassium  chloride (K-DUR) 10 MEQ tablet Take 1 tablet (10 mEq total) by mouth daily. 30 tablet 0  . prochlorperazine (COMPAZINE) 10 MG tablet Take 1 tablet (10 mg total) by mouth every 6 (six) hours as needed (Nausea or vomiting). 30 tablet 1  . Pseudoephedrine HCl (SUDAFED PO) Take by mouth as needed.    . traMADol (ULTRAM) 50 MG tablet Take 2 tablets (100 mg total) every 6 (six) hours as needed by mouth. 10 tablet 0   No current facility-administered medications for this visit.     OBJECTIVE: Middle-aged white woman who appears well  Vitals:  10/01/17 1028  BP: 127/77  Pulse: 97  Resp: 18  Temp: 98.3 F (36.8 C)  SpO2: 100%     Body mass index is 26.01 kg/m.   Wt Readings from Last 3 Encounters:  10/01/17 176 lb 1.6 oz (79.9 kg)  09/17/17 174 lb (78.9 kg)  09/10/17 170 lb 11.2 oz (77.4 kg)     ECOG FS:1 - Symptomatic but completely ambulatory GENERAL: Patient is a well appearing female in no acute distress HEENT:  Sclerae anicteric.  Oropharynx clear and moist. No ulcerations or evidence of oropharyngeal candidiasis. Neck is supple.  NODES:  No cervical, supraclavicular, or axillary lymphadenopathy palpated.  BREAST EXAM:  Deferred. LUNGS:  Clear to auscultation bilaterally.  No wheezes or rhonchi. HEART:  Regular rate and rhythm. No murmur appreciated. ABDOMEN:  Soft, nontender.  Positive, normoactive bowel sounds. No organomegaly palpated. MSK:  No focal spinal tenderness to palpation. Full range of motion bilaterally in the upper extremities. EXTREMITIES:  No peripheral edema.   SKIN:  Clear with no obvious rashes or skin changes. No nail dyscrasia. NEURO:  Nonfocal. Well oriented.  Appropriate affect.    LAB RESULTS:  CMP     Component Value Date/Time   NA 137 10/01/2017 1001   NA 138 07/10/2017 1458   K 3.6 10/01/2017 1001   K 3.7 07/10/2017 1458   CL 104 10/01/2017 1001   CO2 24 10/01/2017 1001   CO2 25 07/10/2017 1458   GLUCOSE 155 (H) 10/01/2017 1001   GLUCOSE  95 07/10/2017 1458   BUN 4 (L) 10/01/2017 1001   BUN 7.4 07/10/2017 1458   CREATININE 0.73 10/01/2017 1001   CREATININE 0.7 07/10/2017 1458   CALCIUM 9.2 10/01/2017 1001   CALCIUM 8.9 07/10/2017 1458   PROT 6.5 10/01/2017 1001   PROT 7.1 07/10/2017 1458   ALBUMIN 3.4 (L) 10/01/2017 1001   ALBUMIN 3.8 07/10/2017 1458   AST 32 10/01/2017 1001   AST 10 07/10/2017 1458   ALT 48 10/01/2017 1001   ALT 11 07/10/2017 1458   ALKPHOS 79 10/01/2017 1001   ALKPHOS 92 07/10/2017 1458   BILITOT <0.2 (L) 10/01/2017 1001   BILITOT <0.22 07/10/2017 1458   GFRNONAA >60 10/01/2017 1001   GFRAA >60 10/01/2017 1001    No results found for: TOTALPROTELP, ALBUMINELP, A1GS, A2GS, BETS, BETA2SER, GAMS, MSPIKE, SPEI  No results found for: Nils Pyle, [facility]  Lab Results  Component Value Date   WBC 3.4 (L) 10/01/2017   NEUTROABS 1.8 10/01/2017   HGB 11.9 10/01/2017   HCT 35.9 10/01/2017   MCV 105.0 (H) 10/01/2017   PLT 301 10/01/2017      Chemistry      Component Value Date/Time   NA 137 10/01/2017 1001   NA 138 07/10/2017 1458   K 3.6 10/01/2017 1001   K 3.7 07/10/2017 1458   CL 104 10/01/2017 1001   CO2 24 10/01/2017 1001   CO2 25 07/10/2017 1458   BUN 4 (L) 10/01/2017 1001   BUN 7.4 07/10/2017 1458   CREATININE 0.73 10/01/2017 1001   CREATININE 0.7 07/10/2017 1458      Component Value Date/Time   CALCIUM 9.2 10/01/2017 1001   CALCIUM 8.9 07/10/2017 1458   ALKPHOS 79 10/01/2017 1001   ALKPHOS 92 07/10/2017 1458   AST 32 10/01/2017 1001   AST 10 07/10/2017 1458   ALT 48 10/01/2017 1001   ALT 11 07/10/2017 1458   BILITOT <0.2 (L) 10/01/2017 1001   BILITOT <0.22 07/10/2017 1458  No results found for: LABCA2  No components found for: QIWLNL892  No results for input(s): INR in the last 168 hours.  No results found for: LABCA2  No results found for: JJH417  No results found for: EYC144  No results found for: YJE563  No results found for:  CA2729  No components found for: HGQUANT  No results found for: CEA1 / No results found for: CEA1   No results found for: AFPTUMOR  No results found for: CHROMOGRNA  No results found for: PSA1  Appointment on 10/01/2017  Component Date Value Ref Range Status  . WBC 10/01/2017 3.4* 3.9 - 10.3 K/uL Final  . RBC 10/01/2017 3.41* 3.70 - 5.45 MIL/uL Final  . Hemoglobin 10/01/2017 11.9  11.6 - 15.9 g/dL Final  . HCT 10/01/2017 35.9  34.8 - 46.6 % Final  . MCV 10/01/2017 105.0* 79.5 - 101.0 fL Final  . MCH 10/01/2017 34.9* 25.1 - 34.0 pg Final  . MCHC 10/01/2017 33.3  31.5 - 36.0 g/dL Final  . RDW 10/01/2017 16.5* 11.2 - 14.5 % Final  . Platelets 10/01/2017 301  145 - 400 K/uL Final  . Neutrophils Relative % 10/01/2017 52  % Final  . Neutro Abs 10/01/2017 1.8  1.5 - 6.5 K/uL Final  . Lymphocytes Relative 10/01/2017 34  % Final  . Lymphs Abs 10/01/2017 1.1  0.9 - 3.3 K/uL Final  . Monocytes Relative 10/01/2017 12  % Final  . Monocytes Absolute 10/01/2017 0.4  0.1 - 0.9 K/uL Final  . Eosinophils Relative 10/01/2017 1  % Final  . Eosinophils Absolute 10/01/2017 0.0  0.0 - 0.5 K/uL Final  . Basophils Relative 10/01/2017 1  % Final  . Basophils Absolute 10/01/2017 0.0  0.0 - 0.1 K/uL Final   Performed at [facility], [place] [address] [place], [place] [address]  . Sodium 10/01/2017 137  136 - 145 mmol/L Final  . Potassium 10/01/2017 3.6  3.5 - 5.1 mmol/L Final  . Chloride 10/01/2017 104  98 - 109 mmol/L Final  . CO2 10/01/2017 24  22 - 29 mmol/L Final  . Glucose, Bld 10/01/2017 155* 70 - 140 mg/dL Final  . BUN 10/01/2017 4* 7 - 26 mg/dL Final  . Creatinine 10/01/2017 0.73  0.60 - 1.10 mg/dL Final  . Calcium 10/01/2017 9.2  8.4 - 10.4 mg/dL Final  . Total Protein 10/01/2017 6.5  6.4 - 8.3 g/dL Final  . Albumin 10/01/2017 3.4* 3.5 - 5.0 g/dL Final  . AST 10/01/2017 32  5 - 34 U/L Final  . ALT 10/01/2017 48  0 - 55 U/L Final  . Alkaline Phosphatase 10/01/2017 79   40 - 150 U/L Final  . Total Bilirubin 10/01/2017 <0.2* 0.2 - 1.2 mg/dL Final  . GFR, Est Non Af Am 10/01/2017 >60  >60 mL/min Final  . GFR, Est AFR Am 10/01/2017 >60  >60 mL/min Final   Comment: (NOTE) The eGFR has been calculated using the CKD EPI equation. This calculation has not been validated in all clinical situations. eGFR's persistently <60 mL/min signify possible Chronic Kidney Disease.   Georgiann Hahn gap 10/01/2017 9  3 - 11 Final   Performed at [facility], [place] [address], [place], [place] [address]    (this displays the last labs from the last 3 days)  No results found for: TOTALPROTELP, ALBUMINELP, A1GS, A2GS, BETS, BETA2SER, GAMS, MSPIKE, SPEI (this displays SPEP labs)  No results found for: KPAFRELGTCHN, LAMBDASER, KAPLAMBRATIO (kappa/lambda light chains)  No results found for: HGBA, HGBA2QUANT, HGBFQUANT, HGBSQUAN (Hemoglobinopathy evaluation)   No results found for: LDH  No results found for: IRON, TIBC, IRONPCTSAT (Iron and TIBC)  No results found for: FERRITIN  Urinalysis    Component Value Date/Time   COLORURINE YELLOW 07/07/2017 1400   APPEARANCEUR CLEAR 07/07/2017 1400   LABSPEC 1.003 (L) 07/07/2017 1400   LABSPEC 1.010 05/30/2017 1315   PHURINE 7.0 07/07/2017 1400   GLUCOSEU NEGATIVE 07/07/2017 1400   GLUCOSEU Negative 05/30/2017 1315   HGBUR NEGATIVE 07/07/2017 1400   BILIRUBINUR NEGATIVE 07/07/2017 1400   BILIRUBINUR Color Interference 05/30/2017 1315   KETONESUR NEGATIVE 07/07/2017 1400   PROTEINUR NEGATIVE 07/07/2017 1400   UROBILINOGEN Color Interference 05/30/2017 1315   NITRITE NEGATIVE 07/07/2017 1400   LEUKOCYTESUR NEGATIVE 07/07/2017 1400   LEUKOCYTESUR Color Interference 05/30/2017 1315     STUDIES: No results found.   ELIGIBLE FOR AVAILABLE RESEARCH PROTOCOL: SWOG [id number] depending on response to neoadjuvant treatment, UPBEAT  ASSESSMENT: 52 y.o. Amy Dawson status post left breast upper outer quadrant  biopsy May 14, 2017 for a clinical T2 N0, stage IIB invasive ductal carcinoma, grade 3, triple negative, with an MIB-1 of 80%  (1) neoadjuvant chemotherapy consisting of doxorubicin and cyclophosphamide in dose dense fashion x4 started 06/04/2017, completed 07/17/2017; followed by weekly carboplatin and paclitaxel x12 started 07/30/2017  (a) echo on 05/30/2017: LVEF 55-60%  (2) breast conserving surgery with sentinel lymph node sampling to follow  (3) adjuvant radiation to follow surgery  (4) genetics testing offered through [facility] completed on 06/13/2017 showed: No deleterious mutations identified. The following genes were evaluated for sequence changes and exonic deletions/duplications: ATM, BARD1, BRCA1, BRCA2, BRIP1, CDH1, CHEK2, DICER1, EPCAM*, MLH1, MSH2, MSH6, NBN, NF1, PALB2, PMS2, PTEN, RAD50, RAD51C, RAD51D, SMARCA4, STK11, TP53 Results are negative unless otherwise indicated   PLAN:  Amy Dawson continues to tolerate treatment well.  She will continue with weekly Paclitaxel and Carboplatin.  She is not experiencing peripheral neuropathy.  Her labs are stable today.  I reviewed them with her in detail.  I also ordered her MRI post chemo tentatively on 10/22/17.    She will return in one week for labs and her next chemotherapy, she will undergo Granix on Friday and Saturday. She will call with any other issues that may develop before then.  A total of (20) minutes of face-to-face time was spent with this patient with greater than 50% of that time in counseling and care-coordination.  Wilber Bihari, NP 10/01/17 10:54 AM Medical Oncology and Hematology [facility] [address] [place], [place] [address] Tel. [phone]    Fax. [phone]

## 2017-10-01 NOTE — Patient Instructions (Signed)
[facility] Discharge Instructions for Patients Receiving Chemotherapy  Today you received the following chemotherapy agents taxol/carboplatin  To help prevent nausea and vomiting after your treatment, we encourage you to take your nausea medication as directed   If you develop nausea and vomiting that is not controlled by your nausea medication, call the clinic.   BELOW ARE SYMPTOMS THAT SHOULD BE REPORTED IMMEDIATELY:  *FEVER GREATER THAN 100.5 F  *CHILLS WITH OR WITHOUT FEVER  NAUSEA AND VOMITING THAT IS NOT CONTROLLED WITH YOUR NAUSEA MEDICATION  *UNUSUAL SHORTNESS OF BREATH  *UNUSUAL BRUISING OR BLEEDING  TENDERNESS IN MOUTH AND THROAT WITH OR WITHOUT PRESENCE OF ULCERS  *URINARY PROBLEMS  *BOWEL PROBLEMS  UNUSUAL RASH Items with * indicate a potential emergency and should be followed up as soon as possible.  Feel free to call the clinic you have any questions or concerns. The clinic phone number is [phone].  

## 2017-10-04 ENCOUNTER — Inpatient Hospital Stay: Payer: 59

## 2017-10-04 DIAGNOSIS — Z171 Estrogen receptor negative status [ER-]: Secondary | ICD-10-CM

## 2017-10-04 DIAGNOSIS — Z95828 Presence of other vascular implants and grafts: Secondary | ICD-10-CM

## 2017-10-04 DIAGNOSIS — C50412 Malignant neoplasm of upper-outer quadrant of left female breast: Secondary | ICD-10-CM

## 2017-10-04 DIAGNOSIS — Z5189 Encounter for other specified aftercare: Secondary | ICD-10-CM | POA: Diagnosis not present

## 2017-10-04 MED ORDER — TBO-FILGRASTIM 480 MCG/0.8ML ~~LOC~~ SOSY
480.0000 ug | PREFILLED_SYRINGE | Freq: Once | SUBCUTANEOUS | Status: AC
  Administered 2017-10-04: 480 ug via SUBCUTANEOUS

## 2017-10-04 MED ORDER — TBO-FILGRASTIM 480 MCG/0.8ML ~~LOC~~ SOSY
PREFILLED_SYRINGE | SUBCUTANEOUS | Status: AC
  Filled 2017-10-04: qty 0.8

## 2017-10-05 ENCOUNTER — Inpatient Hospital Stay: Payer: 59

## 2017-10-05 VITALS — BP 117/68 | HR 104 | Temp 97.7°F | Resp 18

## 2017-10-05 DIAGNOSIS — Z95828 Presence of other vascular implants and grafts: Secondary | ICD-10-CM

## 2017-10-05 DIAGNOSIS — Z5189 Encounter for other specified aftercare: Secondary | ICD-10-CM | POA: Diagnosis not present

## 2017-10-05 DIAGNOSIS — Z171 Estrogen receptor negative status [ER-]: Secondary | ICD-10-CM

## 2017-10-05 DIAGNOSIS — C50412 Malignant neoplasm of upper-outer quadrant of left female breast: Secondary | ICD-10-CM

## 2017-10-05 MED ORDER — TBO-FILGRASTIM 480 MCG/0.8ML ~~LOC~~ SOSY
480.0000 ug | PREFILLED_SYRINGE | Freq: Once | SUBCUTANEOUS | Status: AC
  Administered 2017-10-05: 480 ug via SUBCUTANEOUS

## 2017-10-05 MED ORDER — TBO-FILGRASTIM 480 MCG/0.8ML ~~LOC~~ SOSY
PREFILLED_SYRINGE | SUBCUTANEOUS | Status: AC
Start: 2017-10-05 — End: 2017-10-05
  Filled 2017-10-05: qty 0.8

## 2017-10-05 NOTE — Patient Instructions (Signed)
Tbo-Filgrastim injection What is this medicine? TBO-FILGRASTIM (T B O fil GRA stim) is a granulocyte colony-stimulating factor that stimulates the growth of neutrophils, a type of white blood cell important in the body's fight against infection. It is used to reduce the incidence of fever and infection in patients with certain types of cancer who are receiving chemotherapy that affects the bone marrow. This medicine may be used for other purposes; ask your health care provider or pharmacist if you have questions. COMMON BRAND NAME(S): Granix What should I tell my health care provider before I take this medicine? They need to know if you have any of these conditions: -bone scan or tests planned -kidney disease -sickle cell anemia -an unusual or allergic reaction to tbo-filgrastim, filgrastim, pegfilgrastim, other medicines, foods, dyes, or preservatives -pregnant or trying to get pregnant -breast-feeding How should I use this medicine? This medicine is for injection under the skin. If you get this medicine at home, you will be taught how to prepare and give this medicine. Refer to the Instructions for Use that come with your medication packaging. Use exactly as directed. Take your medicine at regular intervals. Do not take your medicine more often than directed. It is important that you put your used needles and syringes in a special sharps container. Do not put them in a trash can. If you do not have a sharps container, call your pharmacist or healthcare provider to get one. Talk to your pediatrician regarding the use of this medicine in children. Special care may be needed. Overdosage: If you think you have taken too much of this medicine contact a poison control center or emergency room at once. NOTE: This medicine is only for you. Do not share this medicine with others. What if I miss a dose? It is important not to miss your dose. Call your doctor or health care professional if you miss a  dose. What may interact with this medicine? This medicine may interact with the following medications: -medicines that may cause a release of neutrophils, such as lithium This list may not describe all possible interactions. Give your health care provider a list of all the medicines, herbs, non-prescription drugs, or dietary supplements you use. Also tell them if you smoke, drink alcohol, or use illegal drugs. Some items may interact with your medicine. What should I watch for while using this medicine? You may need blood work done while you are taking this medicine. What side effects may I notice from receiving this medicine? Side effects that you should report to your doctor or health care professional as soon as possible: -allergic reactions like skin rash, itching or hives, swelling of the face, lips, or tongue -blood in the urine -dark urine -dizziness -fast heartbeat -feeling faint -shortness of breath or breathing problems -signs and symptoms of infection like fever or chills; cough; or sore throat -signs and symptoms of kidney injury like trouble passing urine or change in the amount of urine -stomach or side pain, or pain at the shoulder -sweating -swelling of the legs, ankles, or abdomen -tiredness Side effects that usually do not require medical attention (report to your doctor or health care professional if they continue or are bothersome): -bone pain -headache -muscle pain -vomiting This list may not describe all possible side effects. Call your doctor for medical advice about side effects. You may report side effects to FDA at 1-800-FDA-1088. Where should I keep my medicine? Keep out of the reach of children. Store in a refrigerator between   2 and 8 degrees C (36 and 46 degrees F). Keep in carton to protect from light. Throw away this medicine if it is left out of the refrigerator for more than 5 consecutive days. Throw away any unused medicine after the expiration  date. NOTE: This sheet is a summary. It may not cover all possible information. If you have questions about this medicine, talk to your doctor, pharmacist, or health care provider.  2018 Elsevier/Gold Standard (2015-08-22 19:07:04)  

## 2017-10-07 ENCOUNTER — Encounter: Payer: Self-pay | Admitting: *Deleted

## 2017-10-08 ENCOUNTER — Inpatient Hospital Stay: Payer: 59

## 2017-10-08 ENCOUNTER — Telehealth: Payer: Self-pay

## 2017-10-08 VITALS — BP 132/82 | HR 95 | Temp 98.1°F | Resp 16 | Wt 179.0 lb

## 2017-10-08 DIAGNOSIS — Z5189 Encounter for other specified aftercare: Secondary | ICD-10-CM | POA: Diagnosis not present

## 2017-10-08 DIAGNOSIS — Z171 Estrogen receptor negative status [ER-]: Principal | ICD-10-CM

## 2017-10-08 DIAGNOSIS — C50412 Malignant neoplasm of upper-outer quadrant of left female breast: Secondary | ICD-10-CM

## 2017-10-08 LAB — CBC WITH DIFFERENTIAL/PLATELET
BASOS PCT: 1 %
Basophils Absolute: 0 10*3/uL (ref 0.0–0.1)
Eosinophils Absolute: 0 10*3/uL (ref 0.0–0.5)
Eosinophils Relative: 0 %
HEMATOCRIT: 34.3 % — AB (ref 34.8–46.6)
Hemoglobin: 11.5 g/dL — ABNORMAL LOW (ref 11.6–15.9)
LYMPHS PCT: 24 %
Lymphs Abs: 1.1 10*3/uL (ref 0.9–3.3)
MCH: 35.4 pg — ABNORMAL HIGH (ref 25.1–34.0)
MCHC: 33.6 g/dL (ref 31.5–36.0)
MCV: 105.4 fL — AB (ref 79.5–101.0)
MONO ABS: 0.3 10*3/uL (ref 0.1–0.9)
Monocytes Relative: 8 %
NEUTROS ABS: 3 10*3/uL (ref 1.5–6.5)
Neutrophils Relative %: 67 %
PLATELETS: 222 10*3/uL (ref 145–400)
RBC: 3.25 MIL/uL — AB (ref 3.70–5.45)
RDW: 16.2 % — AB (ref 11.2–14.5)
WBC: 4.4 10*3/uL (ref 3.9–10.3)

## 2017-10-08 MED ORDER — FAMOTIDINE IN NACL 20-0.9 MG/50ML-% IV SOLN
INTRAVENOUS | Status: AC
  Filled 2017-10-08: qty 50

## 2017-10-08 MED ORDER — DEXAMETHASONE SODIUM PHOSPHATE 10 MG/ML IJ SOLN
10.0000 mg | Freq: Once | INTRAMUSCULAR | Status: AC
  Administered 2017-10-08: 10 mg via INTRAVENOUS

## 2017-10-08 MED ORDER — SODIUM CHLORIDE 0.9 % IV SOLN
80.0000 mg/m2 | Freq: Once | INTRAVENOUS | Status: AC
  Administered 2017-10-08: 156 mg via INTRAVENOUS
  Filled 2017-10-08: qty 26

## 2017-10-08 MED ORDER — HEPARIN SOD (PORK) LOCK FLUSH 100 UNIT/ML IV SOLN
500.0000 [IU] | Freq: Once | INTRAVENOUS | Status: AC | PRN
  Administered 2017-10-08: 500 [IU]
  Filled 2017-10-08: qty 5

## 2017-10-08 MED ORDER — FAMOTIDINE IN NACL 20-0.9 MG/50ML-% IV SOLN
20.0000 mg | Freq: Once | INTRAVENOUS | Status: AC
  Administered 2017-10-08: 20 mg via INTRAVENOUS

## 2017-10-08 MED ORDER — DEXAMETHASONE SODIUM PHOSPHATE 10 MG/ML IJ SOLN
INTRAMUSCULAR | Status: AC
  Filled 2017-10-08: qty 1

## 2017-10-08 MED ORDER — DIPHENHYDRAMINE HCL 50 MG/ML IJ SOLN
INTRAMUSCULAR | Status: AC
  Filled 2017-10-08: qty 1

## 2017-10-08 MED ORDER — PALONOSETRON HCL INJECTION 0.25 MG/5ML
INTRAVENOUS | Status: AC
  Filled 2017-10-08: qty 5

## 2017-10-08 MED ORDER — SODIUM CHLORIDE 0.9 % IV SOLN
Freq: Once | INTRAVENOUS | Status: AC
  Administered 2017-10-08: 11:00:00 via INTRAVENOUS

## 2017-10-08 MED ORDER — SODIUM CHLORIDE 0.9 % IV SOLN
258.2000 mg | Freq: Once | INTRAVENOUS | Status: AC
  Administered 2017-10-08: 260 mg via INTRAVENOUS
  Filled 2017-10-08: qty 26

## 2017-10-08 MED ORDER — SODIUM CHLORIDE 0.9% FLUSH
10.0000 mL | INTRAVENOUS | Status: DC | PRN
  Administered 2017-10-08: 10 mL
  Filled 2017-10-08: qty 10

## 2017-10-08 MED ORDER — PALONOSETRON HCL INJECTION 0.25 MG/5ML
0.2500 mg | Freq: Once | INTRAVENOUS | Status: AC
  Administered 2017-10-08: 0.25 mg via INTRAVENOUS

## 2017-10-08 MED ORDER — DIPHENHYDRAMINE HCL 50 MG/ML IJ SOLN
25.0000 mg | Freq: Once | INTRAMUSCULAR | Status: AC
  Administered 2017-10-08: 25 mg via INTRAVENOUS

## 2017-10-08 NOTE — Telephone Encounter (Signed)
Ok to treat today with CMET from last week per Dr. [name]. Infusion RN aware.  Cyndia [place] RN

## 2017-10-08 NOTE — Patient Instructions (Signed)
[facility] °Discharge Instructions for Patients Receiving Chemotherapy ° °Today you received the following chemotherapy agents Taxol; Carboplatin ° °To help prevent nausea and vomiting after your treatment, we encourage you to take your nausea medication as directed °  °If you develop nausea and vomiting that is not controlled by your nausea medication, call the clinic.  ° °BELOW ARE SYMPTOMS THAT SHOULD BE REPORTED IMMEDIATELY: °· *FEVER GREATER THAN 100.5 F °· *CHILLS WITH OR WITHOUT FEVER °· NAUSEA AND VOMITING THAT IS NOT CONTROLLED WITH YOUR NAUSEA MEDICATION °· *UNUSUAL SHORTNESS OF BREATH °· *UNUSUAL BRUISING OR BLEEDING °· TENDERNESS IN MOUTH AND THROAT WITH OR WITHOUT PRESENCE OF ULCERS °· *URINARY PROBLEMS °· *BOWEL PROBLEMS °· UNUSUAL RASH °Items with * indicate a potential emergency and should be followed up as soon as possible. ° °Feel free to call the clinic should you have any questions or concerns. The clinic phone number is [phone]. ° °Please show the CHEMO ALERT CARD at check-in to the Emergency Department and triage nurse. ° ° °

## 2017-10-11 ENCOUNTER — Other Ambulatory Visit: Payer: Self-pay

## 2017-10-11 ENCOUNTER — Other Ambulatory Visit: Payer: Self-pay | Admitting: Oncology

## 2017-10-11 ENCOUNTER — Inpatient Hospital Stay: Payer: 59

## 2017-10-11 VITALS — BP 132/86 | HR 84 | Temp 98.0°F | Resp 18

## 2017-10-11 DIAGNOSIS — C50412 Malignant neoplasm of upper-outer quadrant of left female breast: Secondary | ICD-10-CM

## 2017-10-11 DIAGNOSIS — Z171 Estrogen receptor negative status [ER-]: Principal | ICD-10-CM

## 2017-10-11 DIAGNOSIS — Z5189 Encounter for other specified aftercare: Secondary | ICD-10-CM | POA: Diagnosis not present

## 2017-10-11 DIAGNOSIS — Z95828 Presence of other vascular implants and grafts: Secondary | ICD-10-CM

## 2017-10-11 DIAGNOSIS — N6310 Unspecified lump in the right breast, unspecified quadrant: Secondary | ICD-10-CM

## 2017-10-11 MED ORDER — TBO-FILGRASTIM 480 MCG/0.8ML ~~LOC~~ SOSY
PREFILLED_SYRINGE | SUBCUTANEOUS | Status: AC
  Filled 2017-10-11: qty 0.8

## 2017-10-11 MED ORDER — TBO-FILGRASTIM 480 MCG/0.8ML ~~LOC~~ SOSY
480.0000 ug | PREFILLED_SYRINGE | Freq: Once | SUBCUTANEOUS | Status: AC
  Administered 2017-10-11: 480 ug via SUBCUTANEOUS

## 2017-10-11 NOTE — Patient Instructions (Signed)
Tbo-Filgrastim  (Granix) injection What is this medicine? TBO-FILGRASTIM (T B O fil GRA stim) is a granulocyte colony-stimulating factor that stimulates the growth of neutrophils, a type of white blood cell important in the body's fight against infection. It is used to reduce the incidence of fever and infection in patients with certain types of cancer who are receiving chemotherapy that affects the bone marrow. This medicine may be used for other purposes; ask your health care provider or pharmacist if you have questions. COMMON BRAND NAME(S): Granix What should I tell my health care provider before I take this medicine? They need to know if you have any of these conditions: -bone scan or tests planned -kidney disease -sickle cell anemia -an unusual or allergic reaction to tbo-filgrastim, filgrastim, pegfilgrastim, other medicines, foods, dyes, or preservatives -pregnant or trying to get pregnant -breast-feeding How should I use this medicine? This medicine is for injection under the skin. If you get this medicine at home, you will be taught how to prepare and give this medicine. Refer to the Instructions for Use that come with your medication packaging. Use exactly as directed. Take your medicine at regular intervals. Do not take your medicine more often than directed. It is important that you put your used needles and syringes in a special sharps container. Do not put them in a trash can. If you do not have a sharps container, call your pharmacist or healthcare provider to get one. Talk to your pediatrician regarding the use of this medicine in children. Special care may be needed. Overdosage: If you think you have taken too much of this medicine contact a poison control center or emergency room at once. NOTE: This medicine is only for you. Do not share this medicine with others. What if I miss a dose? It is important not to miss your dose. Call your doctor or health care professional if you  miss a dose. What may interact with this medicine? This medicine may interact with the following medications: -medicines that may cause a release of neutrophils, such as lithium This list may not describe all possible interactions. Give your health care provider a list of all the medicines, herbs, non-prescription drugs, or dietary supplements you use. Also tell them if you smoke, drink alcohol, or use illegal drugs. Some items may interact with your medicine. What should I watch for while using this medicine? You may need blood work done while you are taking this medicine. What side effects may I notice from receiving this medicine? Side effects that you should report to your doctor or health care professional as soon as possible: -allergic reactions like skin rash, itching or hives, swelling of the face, lips, or tongue -blood in the urine -dark urine -dizziness -fast heartbeat -feeling faint -shortness of breath or breathing problems -signs and symptoms of infection like fever or chills; cough; or sore throat -signs and symptoms of kidney injury like trouble passing urine or change in the amount of urine -stomach or side pain, or pain at the shoulder -sweating -swelling of the legs, ankles, or abdomen -tiredness Side effects that usually do not require medical attention (report to your doctor or health care professional if they continue or are bothersome): -bone pain -headache -muscle pain -vomiting This list may not describe all possible side effects. Call your doctor for medical advice about side effects. You may report side effects to FDA at 1-800-FDA-1088. Where should I keep my medicine? Keep out of the reach of children. Store in a   between 2 and 8 degrees C (36 and 46 degrees F). Keep in carton to protect from light. Throw away this medicine if it is left out of the refrigerator for more than 5 consecutive days. Throw away any unused medicine after the expiration  date. NOTE: This sheet is a summary. It may not cover all possible information. If you have questions about this medicine, talk to your doctor, pharmacist, or health care provider.  2018 Elsevier/Gold Standard (2015-08-22 19:07:04)

## 2017-10-11 NOTE — Progress Notes (Signed)
Patient called and would like order for Granix signed for tomorrow (Saturday) as she had to wait today for the order to be signed. Notified Mendel Ryder NP, verbal order per Wilber Bihari NP. Called pt back, no answer, left her a VM to let her know order has been signed.

## 2017-10-12 ENCOUNTER — Inpatient Hospital Stay: Payer: 59

## 2017-10-12 VITALS — BP 123/78 | HR 88 | Temp 97.8°F | Resp 16

## 2017-10-12 DIAGNOSIS — Z95828 Presence of other vascular implants and grafts: Secondary | ICD-10-CM

## 2017-10-12 DIAGNOSIS — Z171 Estrogen receptor negative status [ER-]: Secondary | ICD-10-CM

## 2017-10-12 DIAGNOSIS — C50412 Malignant neoplasm of upper-outer quadrant of left female breast: Secondary | ICD-10-CM

## 2017-10-12 DIAGNOSIS — Z5189 Encounter for other specified aftercare: Secondary | ICD-10-CM | POA: Diagnosis not present

## 2017-10-12 MED ORDER — TBO-FILGRASTIM 480 MCG/0.8ML ~~LOC~~ SOSY
480.0000 ug | PREFILLED_SYRINGE | Freq: Once | SUBCUTANEOUS | Status: AC
  Administered 2017-10-12: 480 ug via SUBCUTANEOUS

## 2017-10-12 MED ORDER — TBO-FILGRASTIM 480 MCG/0.8ML ~~LOC~~ SOSY
PREFILLED_SYRINGE | SUBCUTANEOUS | Status: AC
  Filled 2017-10-12: qty 0.8

## 2017-10-15 ENCOUNTER — Other Ambulatory Visit: Payer: 59

## 2017-10-15 ENCOUNTER — Inpatient Hospital Stay: Payer: 59

## 2017-10-15 ENCOUNTER — Inpatient Hospital Stay (HOSPITAL_BASED_OUTPATIENT_CLINIC_OR_DEPARTMENT_OTHER): Payer: 59 | Admitting: Adult Health

## 2017-10-15 ENCOUNTER — Encounter: Payer: Self-pay | Admitting: Adult Health

## 2017-10-15 ENCOUNTER — Encounter: Payer: Self-pay | Admitting: *Deleted

## 2017-10-15 ENCOUNTER — Inpatient Hospital Stay: Payer: 59 | Attending: Oncology

## 2017-10-15 VITALS — BP 132/74 | HR 95 | Temp 98.3°F | Resp 18 | Ht 69.0 in | Wt 178.7 lb

## 2017-10-15 DIAGNOSIS — Z5189 Encounter for other specified aftercare: Secondary | ICD-10-CM | POA: Diagnosis present

## 2017-10-15 DIAGNOSIS — Z79899 Other long term (current) drug therapy: Secondary | ICD-10-CM | POA: Insufficient documentation

## 2017-10-15 DIAGNOSIS — Z171 Estrogen receptor negative status [ER-]: Secondary | ICD-10-CM | POA: Diagnosis not present

## 2017-10-15 DIAGNOSIS — L608 Other nail disorders: Secondary | ICD-10-CM | POA: Diagnosis not present

## 2017-10-15 DIAGNOSIS — Z95828 Presence of other vascular implants and grafts: Secondary | ICD-10-CM

## 2017-10-15 DIAGNOSIS — C50412 Malignant neoplasm of upper-outer quadrant of left female breast: Secondary | ICD-10-CM | POA: Insufficient documentation

## 2017-10-15 DIAGNOSIS — Z87891 Personal history of nicotine dependence: Secondary | ICD-10-CM | POA: Insufficient documentation

## 2017-10-15 DIAGNOSIS — Z5111 Encounter for antineoplastic chemotherapy: Secondary | ICD-10-CM | POA: Insufficient documentation

## 2017-10-15 LAB — CMP (CANCER CENTER ONLY)
ALK PHOS: 95 U/L (ref 40–150)
ALT: 27 U/L (ref 0–55)
ANION GAP: 8 (ref 3–11)
AST: 19 U/L (ref 5–34)
Albumin: 3.5 g/dL (ref 3.5–5.0)
BUN: 6 mg/dL — ABNORMAL LOW (ref 7–26)
CALCIUM: 9.2 mg/dL (ref 8.4–10.4)
CO2: 26 mmol/L (ref 22–29)
Chloride: 103 mmol/L (ref 98–109)
Creatinine: 0.73 mg/dL (ref 0.60–1.10)
GFR, Estimated: >60 mL/min (ref >60)
Glucose, Bld: 114 mg/dL (ref 70–140)
Potassium: 3.8 mmol/L (ref 3.5–5.1)
SODIUM: 137 mmol/L (ref 136–145)
TOTAL PROTEIN: 6.7 g/dL (ref 6.4–8.3)

## 2017-10-15 LAB — CBC WITH DIFFERENTIAL/PLATELET
Basophils Absolute: 0 10*3/uL (ref 0.0–0.1)
Basophils Relative: 0 %
EOS ABS: 0 10*3/uL (ref 0.0–0.5)
Eosinophils Relative: 0 %
HEMATOCRIT: 35.9 % (ref 34.8–46.6)
HEMOGLOBIN: 12 g/dL (ref 11.6–15.9)
LYMPHS ABS: 1.3 10*3/uL (ref 0.9–3.3)
Lymphocytes Relative: 27 %
MCH: 35.3 pg — AB (ref 25.1–34.0)
MCHC: 33.4 g/dL (ref 31.5–36.0)
MCV: 105.6 fL — ABNORMAL HIGH (ref 79.5–101.0)
MONOS PCT: 8 %
Monocytes Absolute: 0.4 10*3/uL (ref 0.1–0.9)
NEUTROS PCT: 65 %
Neutro Abs: 3.2 10*3/uL (ref 1.5–6.5)
Platelets: 190 10*3/uL (ref 145–400)
RBC: 3.4 MIL/uL — ABNORMAL LOW (ref 3.70–5.45)
RDW: 15.3 % — ABNORMAL HIGH (ref 11.2–14.5)
WBC: 5 10*3/uL (ref 3.9–10.3)

## 2017-10-15 MED ORDER — SODIUM CHLORIDE 0.9% FLUSH
10.0000 mL | Freq: Once | INTRAVENOUS | Status: AC
  Administered 2017-10-15: 10 mL
  Filled 2017-10-15: qty 10

## 2017-10-15 MED ORDER — PALONOSETRON HCL INJECTION 0.25 MG/5ML
0.2500 mg | Freq: Once | INTRAVENOUS | Status: AC
  Administered 2017-10-15: 0.25 mg via INTRAVENOUS

## 2017-10-15 MED ORDER — SODIUM CHLORIDE 0.9% FLUSH
10.0000 mL | INTRAVENOUS | Status: DC | PRN
  Administered 2017-10-15: 10 mL
  Filled 2017-10-15: qty 10

## 2017-10-15 MED ORDER — DEXAMETHASONE SODIUM PHOSPHATE 10 MG/ML IJ SOLN
INTRAMUSCULAR | Status: AC
Start: 2017-10-15 — End: 2017-10-15
  Filled 2017-10-15: qty 1

## 2017-10-15 MED ORDER — SODIUM CHLORIDE 0.9 % IV SOLN
Freq: Once | INTRAVENOUS | Status: AC
  Administered 2017-10-15: 12:00:00 via INTRAVENOUS

## 2017-10-15 MED ORDER — HEPARIN SOD (PORK) LOCK FLUSH 100 UNIT/ML IV SOLN
500.0000 [IU] | Freq: Once | INTRAVENOUS | Status: AC | PRN
  Administered 2017-10-15: 500 [IU]
  Filled 2017-10-15: qty 5

## 2017-10-15 MED ORDER — DIPHENHYDRAMINE HCL 50 MG/ML IJ SOLN
25.0000 mg | Freq: Once | INTRAMUSCULAR | Status: AC
  Administered 2017-10-15: 25 mg via INTRAVENOUS

## 2017-10-15 MED ORDER — SODIUM CHLORIDE 0.9 % IV SOLN
80.0000 mg/m2 | Freq: Once | INTRAVENOUS | Status: AC
  Administered 2017-10-15: 156 mg via INTRAVENOUS
  Filled 2017-10-15: qty 26

## 2017-10-15 MED ORDER — FAMOTIDINE IN NACL 20-0.9 MG/50ML-% IV SOLN
INTRAVENOUS | Status: AC
  Filled 2017-10-15: qty 50

## 2017-10-15 MED ORDER — DEXAMETHASONE SODIUM PHOSPHATE 10 MG/ML IJ SOLN
10.0000 mg | Freq: Once | INTRAMUSCULAR | Status: AC
  Administered 2017-10-15: 10 mg via INTRAVENOUS

## 2017-10-15 MED ORDER — FAMOTIDINE IN NACL 20-0.9 MG/50ML-% IV SOLN
20.0000 mg | Freq: Once | INTRAVENOUS | Status: AC
  Administered 2017-10-15: 20 mg via INTRAVENOUS

## 2017-10-15 MED ORDER — PALONOSETRON HCL INJECTION 0.25 MG/5ML
INTRAVENOUS | Status: AC
  Filled 2017-10-15: qty 5

## 2017-10-15 MED ORDER — DIPHENHYDRAMINE HCL 50 MG/ML IJ SOLN
INTRAMUSCULAR | Status: AC
  Filled 2017-10-15: qty 1

## 2017-10-15 MED ORDER — SODIUM CHLORIDE 0.9 % IV SOLN
258.2000 mg | Freq: Once | INTRAVENOUS | Status: AC
  Administered 2017-10-15: 260 mg via INTRAVENOUS
  Filled 2017-10-15: qty 26

## 2017-10-15 NOTE — Progress Notes (Signed)
Amy Dawson  [phone] [phone]) [phone]     ID: [name] DOB: 1965/11/11  MR#: [id number]  IYM#:[id number]  Patient Care Team: Amy Freeze, MD as PCP - General (Family Medicine) Dawson, Amy Dad, MD as Consulting Physician (Oncology) [name], MD as Consulting Physician (General Surgery) [name], MD as Consulting Physician (Radiation Oncology) Amy Gully, NP as Nurse Practitioner (Obstetrics and Gynecology) OTHER MD:  CHIEF COMPLAINT: Triple negative breast cancer  CURRENT TREATMENT: Neoadjuvant chemotherapy   HISTORY OF CURRENT ILLNESS: From the original intake note:  Amy Dawson felt some discomfort in the left breast in June 2018, but did not think much of it.  However in October she palpated a change in the upper outer quadrant of her left breast and she brought this to medical attention.  On May 09, 2017 she had bilateral diagnostic mammography with tomography at the breast center, showing the breast density to be category capital C.  In the left breast that was not obscured mass in the upper outer quadrant, which by palpation was identified at the 1230 o'clock radiant 5 cm from the nipple.  By ultrasound this measured 2.9 cm, and it was irregular and hypoechoic.  The left axilla was sonographically benign.  Biopsy of the left breast mass in question May 14, 2017 showed [phone]) and invasive ductal carcinoma, grade 2 or more likely 3, estrogen and progesterone receptor negative, with no HER-2 amplification, the signals ratio being 1.18 and the number per cell 1.65.  The key 67 was 80%.  The patient's subsequent history is as detailed below.  INTERVAL HISTORY: Amy Dawson returns today for follow-up and treatment of her triple negative breast cancer. She continues on paclitaxel and carboplatin treatments every 7 days with a dose due today.  She is due for week 11 today.  She has missed her treatment previously for  neutropenia.  She receives Granix after every treatment to prevent further treatment delays due to chemotherapy induced neutropenia on Friday and Saturday after her Tuesday treatment.     REVIEW OF SYSTEMS: Amy Dawson continues to tolerate her chemotherapy well.  She denies peripheral neuropathy.  She continues to have sore fingernails and a nail dyscrasia.  She denies fevers, chills, chest pain, cough, nausea, vomiting, diarrhea, or any other concerns.  A detailed ROS was conducted and was non contributory.    PAST MEDICAL HISTORY: Past Medical History:  Diagnosis Date  . Allergy   . Anxiety   . Breast cancer ([place])   . Chronic kidney disease    bladder issues  . Depression   . Family history of breast cancer   . Fibromyalgia   . Genetic testing 06/13/2017   Breast/GYN panel (23 genes) @ [facility] - No pathogenic mutations detected  . Headache    smokes marijuana for migraine pain  . Murmur, cardiac    as a child  . Stroke ([place]) 2000   numbness on left side  . TMJ (dislocation of temporomandibular joint)     PAST SURGICAL HISTORY: Past Surgical History:  Procedure Laterality Date  . CESAREAN SECTION     2  . ENDOMETRIAL ABLATION  2011  . PORTACATH PLACEMENT Right 05/31/2017   Procedure: INSERTION PORT-A-CATH WITH [place];  Surgeon: [name], MD;  Location: [facility];  Service: General;  Laterality: Right;    FAMILY HISTORY Family History  Problem Relation Age of Onset  . Liver cancer Sister        "rare type"; deceased 36  .  Breast cancer Maternal Aunt        Dx 62s; currently 72s  . Breast cancer Paternal Grandmother        Dx 93s; deceased 36s  . Breast cancer Other        mat grandfather's sister; dx 33s; deceased 65s  . Cancer Paternal Aunt        unk. type; deceased 64  . Colon cancer Neg Hx   . Esophageal cancer Neg Hx   . Rectal cancer Neg Hx   . Stomach cancer Neg Hx   The patient's parents are in their mid 85s as of November 2018.  The  patient had 1 brother, 1 sister.  The sister died from a rare liver cancer at age 63.  A maternal aunt and a paternal grandmother were diagnosed with breast cancer in their 22s.  GYNECOLOGIC HISTORY:  No LMP recorded. Patient has had an ablation. Menarche age 54.  She underwent endometrial ablation 2011.  She is no longer having periods.  She never used hormone replacement.  First live birth was age 55.  She is GX P2.   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  Her husband Amy Dawson works as an Firefighter for [employer]  Daughter Amy Dawson attends [employer].  Son Amy Dawson is doing on apprenticeship in the [employer] program.    ADVANCED DIRECTIVES: Not in place   HEALTH MAINTENANCE: Social History   Tobacco Use  . Smoking status: Former Research scientist (life sciences)  . Smokeless tobacco: Never Used  Substance Use Topics  . Alcohol use: Yes    Alcohol/week: 0.0 oz    Comment: sometimes  . Drug use: Yes    Types: Marijuana    Comment: last smoked 05-29-17     Colonoscopy: 2016/[facility]  PAP: October 2018  Bone density: Never   Allergies  Allergen Reactions  . Other Other (See Comments)    Red wine & white cheese: migraine Raw egg whites- hives    Current Outpatient Medications  Medication Sig Dispense Refill  . amitriptyline (ELAVIL) 50 MG tablet Take 25 mg by mouth at bedtime as needed for sleep.    Marland Kitchen azelastine (OPTIVAR) 0.05 % ophthalmic solution INSTILL 1 DROP INTO EACH EYE AS NEEDED  1  . Calcium Glycerophosphate (PRELIEF PO) Take 2 tablets by mouth daily.     . Cetirizine HCl (ZYRTEC ALLERGY PO) Take by mouth daily.    Marland Kitchen dexamethasone (DECADRON) 4 MG tablet Take 2 tablets by mouth once a day on the day after chemotherapy and then take 2 tablets two times a day for 2 days. Take with food. 30 tablet 1  . dimenhyDRINATE (DRAMAMINE) 50 MG tablet Take 50 mg every 8 (eight) hours as needed by mouth.    . fluticasone (FLONASE) 50  MCG/ACT nasal spray Place 2 sprays into both nostrils daily.    Marland Kitchen LORazepam (ATIVAN) 0.5 MG tablet Take 1 tablet (0.5 mg total) by mouth at bedtime as needed for anxiety. 30 tablet 0  . potassium chloride (K-DUR) 10 MEQ tablet Take 1 tablet (10 mEq total) by mouth daily. 30 tablet 0  . prochlorperazine (COMPAZINE) 10 MG tablet Take 1 tablet (10 mg total) by mouth every 6 (six) hours as needed (Nausea or vomiting). 30 tablet 1  . Pseudoephedrine HCl (SUDAFED PO) Take by mouth as needed.    . traMADol (ULTRAM) 50 MG tablet Take 2 tablets (100 mg total) every 6 (six) hours as needed  by mouth. 10 tablet 0   No current facility-administered medications for this visit.    Facility-Administered Medications Ordered in Other Visits  Medication Dose Route Frequency Provider Last Rate Last Dose  . sodium chloride flush (NS) 0.9 % injection 10 mL  10 mL Intracatheter PRN Dawson, Amy Dad, MD   10 mL at 10/15/17 1519    OBJECTIVE: Vitals:   10/15/17 1028  BP: 132/74  Pulse: 95  Resp: 18  Temp: 98.3 F (36.8 C)  SpO2: 99%     Body mass index is 26.39 kg/m.   Wt Readings from Last 3 Encounters:  10/15/17 178 lb 11.2 oz (81.1 kg)  10/08/17 179 lb (81.2 kg)  10/01/17 176 lb 1.6 oz (79.9 kg)     ECOG FS:1 - Symptomatic but completely ambulatory GENERAL: Patient is a well appearing female in no acute distress HEENT:  Sclerae anicteric.  Oropharynx clear and moist. No ulcerations or evidence of oropharyngeal candidiasis. Neck is supple.  NODES:  No cervical, supraclavicular, or axillary lymphadenopathy palpated.  BREAST EXAM:  Deferred. LUNGS:  Clear to auscultation bilaterally.  No wheezes or rhonchi. HEART:  Regular rate and rhythm. No murmur appreciated. ABDOMEN:  Soft, nontender.  Positive, normoactive bowel sounds. No organomegaly palpated. MSK:  No focal spinal tenderness to palpation. Full range of motion bilaterally in the upper extremities. EXTREMITIES:  No peripheral edema.   SKIN:   Clear with no obvious rashes or skin changes. No nail dyscrasia. NEURO:  Nonfocal. Well oriented.  Appropriate affect.    LAB RESULTS:  CMP     Component Value Date/Time   NA 137 10/15/2017 1031   NA 138 07/10/2017 1458   K 3.8 10/15/2017 1031   K 3.7 07/10/2017 1458   CL 103 10/15/2017 1031   CO2 26 10/15/2017 1031   CO2 25 07/10/2017 1458   GLUCOSE 114 10/15/2017 1031   GLUCOSE 95 07/10/2017 1458   BUN 6 (L) 10/15/2017 1031   BUN 7.4 07/10/2017 1458   CREATININE 0.73 10/15/2017 1031   CREATININE 0.7 07/10/2017 1458   CALCIUM 9.2 10/15/2017 1031   CALCIUM 8.9 07/10/2017 1458   PROT 6.7 10/15/2017 1031   PROT 7.1 07/10/2017 1458   ALBUMIN 3.5 10/15/2017 1031   ALBUMIN 3.8 07/10/2017 1458   AST 19 10/15/2017 1031   AST 10 07/10/2017 1458   ALT 27 10/15/2017 1031   ALT 11 07/10/2017 1458   ALKPHOS 95 10/15/2017 1031   ALKPHOS 92 07/10/2017 1458   BILITOT <0.2 (L) 10/15/2017 1031   BILITOT <0.22 07/10/2017 1458   GFRNONAA >60 10/15/2017 1031   GFRAA >60 10/15/2017 1031    No results found for: TOTALPROTELP, ALBUMINELP, A1GS, A2GS, BETS, BETA2SER, GAMS, MSPIKE, SPEI  No results found for: KPAFRELGTCHN, LAMBDASER, [facility]  Lab Results  Component Value Date   WBC 5.0 10/15/2017   NEUTROABS 3.2 10/15/2017   HGB 12.0 10/15/2017   HCT 35.9 10/15/2017   MCV 105.6 (H) 10/15/2017   PLT 190 10/15/2017      Chemistry      Component Value Date/Time   NA 137 10/15/2017 1031   NA 138 07/10/2017 1458   K 3.8 10/15/2017 1031   K 3.7 07/10/2017 1458   CL 103 10/15/2017 1031   CO2 26 10/15/2017 1031   CO2 25 07/10/2017 1458   BUN 6 (L) 10/15/2017 1031   BUN 7.4 07/10/2017 1458   CREATININE 0.73 10/15/2017 1031   CREATININE 0.7 07/10/2017 1458      Component  Value Date/Time   CALCIUM 9.2 10/15/2017 1031   CALCIUM 8.9 07/10/2017 1458   ALKPHOS 95 10/15/2017 1031   ALKPHOS 92 07/10/2017 1458   AST 19 10/15/2017 1031   AST 10 07/10/2017 1458   ALT 27  10/15/2017 1031   ALT 11 07/10/2017 1458   BILITOT <0.2 (L) 10/15/2017 1031   BILITOT <0.22 07/10/2017 1458       No results found for: LABCA2  No components found for: [id number]  No results for input(s): INR in the last 168 hours.  No results found for: LABCA2  No results found for: NAT557  No results found for: DUK025  No results found for: KYH062  No results found for: CA2729  No components found for: HGQUANT  No results found for: CEA1 / No results found for: CEA1   No results found for: AFPTUMOR  No results found for: CHROMOGRNA  No results found for: PSA1  Appointment on 10/15/2017  Component Date Value Ref Range Status  . WBC 10/15/2017 5.0  3.9 - 10.3 K/uL Final  . RBC 10/15/2017 3.40* 3.70 - 5.45 MIL/uL Final  . Hemoglobin 10/15/2017 12.0  11.6 - 15.9 g/dL Final  . HCT 10/15/2017 35.9  34.8 - 46.6 % Final  . MCV 10/15/2017 105.6* 79.5 - 101.0 fL Final  . MCH 10/15/2017 35.3* 25.1 - 34.0 pg Final  . MCHC 10/15/2017 33.4  31.5 - 36.0 g/dL Final  . RDW 10/15/2017 15.3* 11.2 - 14.5 % Final  . Platelets 10/15/2017 190  145 - 400 K/uL Final  . Neutrophils Relative % 10/15/2017 65  % Final  . Neutro Abs 10/15/2017 3.2  1.5 - 6.5 K/uL Final  . Lymphocytes Relative 10/15/2017 27  % Final  . Lymphs Abs 10/15/2017 1.3  0.9 - 3.3 K/uL Final  . Monocytes Relative 10/15/2017 8  % Final  . Monocytes Absolute 10/15/2017 0.4  0.1 - 0.9 K/uL Final  . Eosinophils Relative 10/15/2017 0  % Final  . Eosinophils Absolute 10/15/2017 0.0  0.0 - 0.5 K/uL Final  . Basophils Relative 10/15/2017 0  % Final  . Basophils Absolute 10/15/2017 0.0  0.0 - 0.1 K/uL Final   Performed at [facility], [place] [address] [place], [place] [address]  . Sodium 10/15/2017 137  136 - 145 mmol/L Final  . Potassium 10/15/2017 3.8  3.5 - 5.1 mmol/L Final  . Chloride 10/15/2017 103  98 - 109 mmol/L Final  . CO2 10/15/2017 26  22 - 29 mmol/L Final  . Glucose, Bld 10/15/2017  114  70 - 140 mg/dL Final  . BUN 10/15/2017 6* 7 - 26 mg/dL Final  . Creatinine 10/15/2017 0.73  0.60 - 1.10 mg/dL Final  . Calcium 10/15/2017 9.2  8.4 - 10.4 mg/dL Final  . Total Protein 10/15/2017 6.7  6.4 - 8.3 g/dL Final  . Albumin 10/15/2017 3.5  3.5 - 5.0 g/dL Final  . AST 10/15/2017 19  5 - 34 U/L Final  . ALT 10/15/2017 27  0 - 55 U/L Final  . Alkaline Phosphatase 10/15/2017 95  40 - 150 U/L Final  . Total Bilirubin 10/15/2017 <0.2* 0.2 - 1.2 mg/dL Final  . GFR, Est Non Af Am 10/15/2017 >60  >60 mL/min Final  . GFR, Est AFR Am 10/15/2017 >60  >60 mL/min Final   Comment: (NOTE) The eGFR has been calculated using the CKD EPI equation. This calculation has not been validated in all clinical situations. eGFR's persistently <60 mL/min signify possible Chronic  Kidney Disease.   Georgiann Hahn gap 10/15/2017 8  3 - 11 Final   Performed at [facility], [place] [address] [place], [place] [address]    (this displays the last labs from the last 3 days)  No results found for: TOTALPROTELP, ALBUMINELP, A1GS, A2GS, BETS, BETA2SER, GAMS, MSPIKE, SPEI (this displays SPEP labs)  No results found for: KPAFRELGTCHN, LAMBDASER, KAPLAMBRATIO (kappa/lambda light chains)  No results found for: HGBA, HGBA2QUANT, HGBFQUANT, HGBSQUAN (Hemoglobinopathy evaluation)   No results found for: LDH  No results found for: IRON, TIBC, IRONPCTSAT (Iron and TIBC)  No results found for: FERRITIN  Urinalysis    Component Value Date/Time   COLORURINE YELLOW 07/07/2017 1400   APPEARANCEUR CLEAR 07/07/2017 1400   LABSPEC 1.003 (L) 07/07/2017 1400   LABSPEC 1.010 05/30/2017 1315   PHURINE 7.0 07/07/2017 1400   GLUCOSEU NEGATIVE 07/07/2017 1400   GLUCOSEU Negative 05/30/2017 1315   HGBUR NEGATIVE 07/07/2017 1400   BILIRUBINUR NEGATIVE 07/07/2017 1400   BILIRUBINUR Color Interference 05/30/2017 1315   KETONESUR NEGATIVE 07/07/2017 1400   PROTEINUR NEGATIVE 07/07/2017 1400    UROBILINOGEN Color Interference 05/30/2017 1315   NITRITE NEGATIVE 07/07/2017 1400   LEUKOCYTESUR NEGATIVE 07/07/2017 1400   LEUKOCYTESUR Color Interference 05/30/2017 1315     STUDIES: No results found.   ELIGIBLE FOR AVAILABLE RESEARCH PROTOCOL: SWOG 1660 depending on response to neoadjuvant treatment, UPBEAT  ASSESSMENT: 52 y.o. [place] status post left breast upper outer quadrant biopsy May 14, 2017 for a clinical T2 N0, stage IIB invasive ductal carcinoma, grade 3, triple negative, with an MIB-1 of 80%  (1) neoadjuvant chemotherapy consisting of doxorubicin and cyclophosphamide in dose dense fashion x4 started 06/04/2017, completed 07/17/2017; followed by weekly carboplatin and paclitaxel x12 started 07/30/2017  (a) echo on 05/30/2017: LVEF 55-60%  (2) breast conserving surgery with sentinel lymph node sampling to follow  (3) adjuvant radiation to follow surgery  (4) genetics testing offered through [facility] completed on 06/13/2017 showed: No deleterious mutations identified. The following genes were evaluated for sequence changes and exonic deletions/duplications: ATM, BARD1, BRCA1, BRCA2, BRIP1, CDH1, CHEK2, DICER1, EPCAM*, MLH1, MSH2, MSH6, NBN, NF1, PALB2, PMS2, PTEN, RAD50, RAD51C, RAD51D, SMARCA4, STK11, TP53 Results are negative unless otherwise indicated   PLAN:  Kinley continues to do well today.  She is tolerating chemotherapy well with the exception of her nail dyscrasia.  She does not have peripheral neuropathy.  She will continue to receive chemotherapy with Paclitaxel and Carboplatin.  She will undergo her MRI next week after her last cycle of treatment.    She will continue to receive Granix on Friday and Saturday. She will call with any other issues that may develop before then.  A total of (20) minutes of face-to-face time was spent with this patient with greater than 50% of that time in counseling and care-coordination.  Wilber Bihari, NP 10/15/17 3:40  PM Medical Oncology and Hematology [facility] [address] [place], [place] [address] Tel. [phone]    Fax. [phone]

## 2017-10-15 NOTE — Patient Instructions (Signed)
[facility] °Discharge Instructions for Patients Receiving Chemotherapy ° °Today you received the following chemotherapy agents Taxol; Carboplatin ° °To help prevent nausea and vomiting after your treatment, we encourage you to take your nausea medication as directed °  °If you develop nausea and vomiting that is not controlled by your nausea medication, call the clinic.  ° °BELOW ARE SYMPTOMS THAT SHOULD BE REPORTED IMMEDIATELY: °· *FEVER GREATER THAN 100.5 F °· *CHILLS WITH OR WITHOUT FEVER °· NAUSEA AND VOMITING THAT IS NOT CONTROLLED WITH YOUR NAUSEA MEDICATION °· *UNUSUAL SHORTNESS OF BREATH °· *UNUSUAL BRUISING OR BLEEDING °· TENDERNESS IN MOUTH AND THROAT WITH OR WITHOUT PRESENCE OF ULCERS °· *URINARY PROBLEMS °· *BOWEL PROBLEMS °· UNUSUAL RASH °Items with * indicate a potential emergency and should be followed up as soon as possible. ° °Feel free to call the clinic should you have any questions or concerns. The clinic phone number is [phone]. ° °Please show the CHEMO ALERT CARD at check-in to the Emergency Department and triage nurse. ° ° °

## 2017-10-16 ENCOUNTER — Telehealth: Payer: Self-pay | Admitting: Adult Health

## 2017-10-16 ENCOUNTER — Ambulatory Visit
Admission: RE | Admit: 2017-10-16 | Discharge: 2017-10-16 | Disposition: A | Discharge status: Home / Self Care | Payer: 59 | Source: Ambulatory Visit | Attending: Oncology | Admitting: Oncology

## 2017-10-16 ENCOUNTER — Other Ambulatory Visit: Payer: Self-pay | Admitting: Oncology

## 2017-10-16 DIAGNOSIS — C50412 Malignant neoplasm of upper-outer quadrant of left female breast: Secondary | ICD-10-CM

## 2017-10-16 DIAGNOSIS — N6310 Unspecified lump in the right breast, unspecified quadrant: Secondary | ICD-10-CM

## 2017-10-16 DIAGNOSIS — Z171 Estrogen receptor negative status [ER-]: Principal | ICD-10-CM

## 2017-10-16 HISTORY — DX: Personal history of antineoplastic chemotherapy: Z92.21

## 2017-10-16 NOTE — Telephone Encounter (Signed)
Called patient regarding 4/5 and 4/6

## 2017-10-18 ENCOUNTER — Inpatient Hospital Stay: Payer: 59

## 2017-10-18 DIAGNOSIS — Z171 Estrogen receptor negative status [ER-]: Secondary | ICD-10-CM

## 2017-10-18 DIAGNOSIS — Z5111 Encounter for antineoplastic chemotherapy: Secondary | ICD-10-CM | POA: Diagnosis not present

## 2017-10-18 DIAGNOSIS — C50412 Malignant neoplasm of upper-outer quadrant of left female breast: Secondary | ICD-10-CM

## 2017-10-18 DIAGNOSIS — Z95828 Presence of other vascular implants and grafts: Secondary | ICD-10-CM

## 2017-10-18 MED ORDER — TBO-FILGRASTIM 480 MCG/0.8ML ~~LOC~~ SOSY
PREFILLED_SYRINGE | SUBCUTANEOUS | Status: AC
  Filled 2017-10-18: qty 0.8

## 2017-10-18 MED ORDER — TBO-FILGRASTIM 480 MCG/0.8ML ~~LOC~~ SOSY
480.0000 ug | PREFILLED_SYRINGE | Freq: Once | SUBCUTANEOUS | Status: AC
  Administered 2017-10-18: 480 ug via SUBCUTANEOUS

## 2017-10-18 NOTE — Patient Instructions (Signed)
Tbo-Filgrastim injection What is this medicine? TBO-FILGRASTIM (T B O fil GRA stim) is a granulocyte colony-stimulating factor that stimulates the growth of neutrophils, a type of white blood cell important in the body's fight against infection. It is used to reduce the incidence of fever and infection in patients with certain types of cancer who are receiving chemotherapy that affects the bone marrow. This medicine may be used for other purposes; ask your health care provider or pharmacist if you have questions. COMMON BRAND NAME(S): Granix What should I tell my health care provider before I take this medicine? They need to know if you have any of these conditions: -bone scan or tests planned -kidney disease -sickle cell anemia -an unusual or allergic reaction to tbo-filgrastim, filgrastim, pegfilgrastim, other medicines, foods, dyes, or preservatives -pregnant or trying to get pregnant -breast-feeding How should I use this medicine? This medicine is for injection under the skin. If you get this medicine at home, you will be taught how to prepare and give this medicine. Refer to the Instructions for Use that come with your medication packaging. Use exactly as directed. Take your medicine at regular intervals. Do not take your medicine more often than directed. It is important that you put your used needles and syringes in a special sharps container. Do not put them in a trash can. If you do not have a sharps container, call your pharmacist or healthcare provider to get one. Talk to your pediatrician regarding the use of this medicine in children. Special care may be needed. Overdosage: If you think you have taken too much of this medicine contact a poison control center or emergency room at once. NOTE: This medicine is only for you. Do not share this medicine with others. What if I miss a dose? It is important not to miss your dose. Call your doctor or health care professional if you miss a  dose. What may interact with this medicine? This medicine may interact with the following medications: -medicines that may cause a release of neutrophils, such as lithium This list may not describe all possible interactions. Give your health care provider a list of all the medicines, herbs, non-prescription drugs, or dietary supplements you use. Also tell them if you smoke, drink alcohol, or use illegal drugs. Some items may interact with your medicine. What should I watch for while using this medicine? You may need blood work done while you are taking this medicine. What side effects may I notice from receiving this medicine? Side effects that you should report to your doctor or health care professional as soon as possible: -allergic reactions like skin rash, itching or hives, swelling of the face, lips, or tongue -blood in the urine -dark urine -dizziness -fast heartbeat -feeling faint -shortness of breath or breathing problems -signs and symptoms of infection like fever or chills; cough; or sore throat -signs and symptoms of kidney injury like trouble passing urine or change in the amount of urine -stomach or side pain, or pain at the shoulder -sweating -swelling of the legs, ankles, or abdomen -tiredness Side effects that usually do not require medical attention (report to your doctor or health care professional if they continue or are bothersome): -bone pain -headache -muscle pain -vomiting This list may not describe all possible side effects. Call your doctor for medical advice about side effects. You may report side effects to FDA at 1-800-FDA-1088. Where should I keep my medicine? Keep out of the reach of children. Store in a refrigerator between   2 and 8 degrees C (36 and 46 degrees F). Keep in carton to protect from light. Throw away this medicine if it is left out of the refrigerator for more than 5 consecutive days. Throw away any unused medicine after the expiration  date. NOTE: This sheet is a summary. It may not cover all possible information. If you have questions about this medicine, talk to your doctor, pharmacist, or health care provider.  2018 Elsevier/Gold Standard (2015-08-22 19:07:04)  

## 2017-10-19 ENCOUNTER — Inpatient Hospital Stay: Payer: 59

## 2017-10-19 VITALS — BP 129/82 | HR 88 | Temp 98.3°F | Resp 16

## 2017-10-19 DIAGNOSIS — Z95828 Presence of other vascular implants and grafts: Secondary | ICD-10-CM

## 2017-10-19 DIAGNOSIS — Z171 Estrogen receptor negative status [ER-]: Secondary | ICD-10-CM

## 2017-10-19 DIAGNOSIS — C50412 Malignant neoplasm of upper-outer quadrant of left female breast: Secondary | ICD-10-CM

## 2017-10-19 DIAGNOSIS — Z5111 Encounter for antineoplastic chemotherapy: Secondary | ICD-10-CM | POA: Diagnosis not present

## 2017-10-19 MED ORDER — TBO-FILGRASTIM 480 MCG/0.8ML ~~LOC~~ SOSY
PREFILLED_SYRINGE | SUBCUTANEOUS | Status: AC
  Filled 2017-10-19: qty 0.8

## 2017-10-19 MED ORDER — TBO-FILGRASTIM 480 MCG/0.8ML ~~LOC~~ SOSY
480.0000 ug | PREFILLED_SYRINGE | Freq: Once | SUBCUTANEOUS | Status: AC
  Administered 2017-10-19: 480 ug via SUBCUTANEOUS

## 2017-10-19 NOTE — Patient Instructions (Signed)
Tbo-Filgrastim injection What is this medicine? TBO-FILGRASTIM (T B O fil GRA stim) is a granulocyte colony-stimulating factor that stimulates the growth of neutrophils, a type of white blood cell important in the body's fight against infection. It is used to reduce the incidence of fever and infection in patients with certain types of cancer who are receiving chemotherapy that affects the bone marrow. This medicine may be used for other purposes; ask your health care provider or pharmacist if you have questions. COMMON BRAND NAME(S): Granix What should I tell my health care provider before I take this medicine? They need to know if you have any of these conditions: -bone scan or tests planned -kidney disease -sickle cell anemia -an unusual or allergic reaction to tbo-filgrastim, filgrastim, pegfilgrastim, other medicines, foods, dyes, or preservatives -pregnant or trying to get pregnant -breast-feeding How should I use this medicine? This medicine is for injection under the skin. If you get this medicine at home, you will be taught how to prepare and give this medicine. Refer to the Instructions for Use that come with your medication packaging. Use exactly as directed. Take your medicine at regular intervals. Do not take your medicine more often than directed. It is important that you put your used needles and syringes in a special sharps container. Do not put them in a trash can. If you do not have a sharps container, call your pharmacist or healthcare provider to get one. Talk to your pediatrician regarding the use of this medicine in children. Special care may be needed. Overdosage: If you think you have taken too much of this medicine contact a poison control center or emergency room at once. NOTE: This medicine is only for you. Do not share this medicine with others. What if I miss a dose? It is important not to miss your dose. Call your doctor or health care professional if you miss a  dose. What may interact with this medicine? This medicine may interact with the following medications: -medicines that may cause a release of neutrophils, such as lithium This list may not describe all possible interactions. Give your health care provider a list of all the medicines, herbs, non-prescription drugs, or dietary supplements you use. Also tell them if you smoke, drink alcohol, or use illegal drugs. Some items may interact with your medicine. What should I watch for while using this medicine? You may need blood work done while you are taking this medicine. What side effects may I notice from receiving this medicine? Side effects that you should report to your doctor or health care professional as soon as possible: -allergic reactions like skin rash, itching or hives, swelling of the face, lips, or tongue -blood in the urine -dark urine -dizziness -fast heartbeat -feeling faint -shortness of breath or breathing problems -signs and symptoms of infection like fever or chills; cough; or sore throat -signs and symptoms of kidney injury like trouble passing urine or change in the amount of urine -stomach or side pain, or pain at the shoulder -sweating -swelling of the legs, ankles, or abdomen -tiredness Side effects that usually do not require medical attention (report to your doctor or health care professional if they continue or are bothersome): -bone pain -headache -muscle pain -vomiting This list may not describe all possible side effects. Call your doctor for medical advice about side effects. You may report side effects to FDA at 1-800-FDA-1088. Where should I keep my medicine? Keep out of the reach of children. Store in a refrigerator between   2 and 8 degrees C (36 and 46 degrees F). Keep in carton to protect from light. Throw away this medicine if it is left out of the refrigerator for more than 5 consecutive days. Throw away any unused medicine after the expiration  date. NOTE: This sheet is a summary. It may not cover all possible information. If you have questions about this medicine, talk to your doctor, pharmacist, or health care provider.  2018 Elsevier/Gold Standard (2015-08-22 19:07:04)  

## 2017-10-21 ENCOUNTER — Ambulatory Visit
Admission: RE | Admit: 2017-10-21 | Discharge: 2017-10-21 | Disposition: A | Discharge status: Home / Self Care | Payer: 59 | Source: Ambulatory Visit | Attending: Oncology | Admitting: Oncology

## 2017-10-21 DIAGNOSIS — N6310 Unspecified lump in the right breast, unspecified quadrant: Secondary | ICD-10-CM

## 2017-10-22 ENCOUNTER — Inpatient Hospital Stay: Payer: 59

## 2017-10-22 ENCOUNTER — Other Ambulatory Visit: Payer: Self-pay | Admitting: Oncology

## 2017-10-22 ENCOUNTER — Encounter: Payer: Self-pay | Admitting: *Deleted

## 2017-10-22 VITALS — BP 129/74 | HR 86 | Temp 98.6°F | Resp 16

## 2017-10-22 DIAGNOSIS — Z171 Estrogen receptor negative status [ER-]: Principal | ICD-10-CM

## 2017-10-22 DIAGNOSIS — Z5111 Encounter for antineoplastic chemotherapy: Secondary | ICD-10-CM | POA: Diagnosis not present

## 2017-10-22 DIAGNOSIS — C50412 Malignant neoplasm of upper-outer quadrant of left female breast: Secondary | ICD-10-CM

## 2017-10-22 LAB — CBC WITH DIFFERENTIAL/PLATELET
BASOS ABS: 0 10*3/uL (ref 0.0–0.1)
BASOS PCT: 1 %
EOS PCT: 0 %
Eosinophils Absolute: 0 10*3/uL (ref 0.0–0.5)
HCT: 35 % (ref 34.8–46.6)
Hemoglobin: 11.6 g/dL (ref 11.6–15.9)
Lymphocytes Relative: 28 %
Lymphs Abs: 1.2 10*3/uL (ref 0.9–3.3)
MCH: 34.9 pg — ABNORMAL HIGH (ref 25.1–34.0)
MCHC: 33.1 g/dL (ref 31.5–36.0)
MCV: 105.4 fL — ABNORMAL HIGH (ref 79.5–101.0)
MONO ABS: 0.4 10*3/uL (ref 0.1–0.9)
Monocytes Relative: 9 %
Neutro Abs: 2.5 10*3/uL (ref 1.5–6.5)
Neutrophils Relative %: 62 %
PLATELETS: 158 10*3/uL (ref 145–400)
RBC: 3.32 MIL/uL — AB (ref 3.70–5.45)
RDW: 15.5 % — AB (ref 11.2–14.5)
WBC: 4.1 10*3/uL (ref 3.9–10.3)

## 2017-10-22 LAB — CMP (CANCER CENTER ONLY)
ALBUMIN: 3.2 g/dL — AB (ref 3.5–5.0)
ALT: 23 U/L (ref 0–55)
AST: 20 U/L (ref 5–34)
Alkaline Phosphatase: 101 U/L (ref 40–150)
Anion gap: 8 (ref 3–11)
BUN: 7 mg/dL (ref 7–26)
CHLORIDE: 105 mmol/L (ref 98–109)
CO2: 26 mmol/L (ref 22–29)
Calcium: 8.6 mg/dL (ref 8.4–10.4)
Creatinine: 0.75 mg/dL (ref 0.60–1.10)
GFR, Est AFR Am: >60 mL/min (ref >60)
GFR, Estimated: >60 mL/min (ref >60)
GLUCOSE: 120 mg/dL (ref 70–140)
POTASSIUM: 3.7 mmol/L (ref 3.5–5.1)
Sodium: 139 mmol/L (ref 136–145)
Total Bilirubin: <0.2 mg/dL — ABNORMAL LOW (ref 0.2–1.2)
Total Protein: 6.3 g/dL — ABNORMAL LOW (ref 6.4–8.3)

## 2017-10-22 MED ORDER — SODIUM CHLORIDE 0.9 % IV SOLN
Freq: Once | INTRAVENOUS | Status: AC
  Administered 2017-10-22: 14:00:00 via INTRAVENOUS

## 2017-10-22 MED ORDER — DEXAMETHASONE SODIUM PHOSPHATE 10 MG/ML IJ SOLN
10.0000 mg | Freq: Once | INTRAMUSCULAR | Status: AC
  Administered 2017-10-22: 10 mg via INTRAVENOUS

## 2017-10-22 MED ORDER — FAMOTIDINE IN NACL 20-0.9 MG/50ML-% IV SOLN
20.0000 mg | Freq: Once | INTRAVENOUS | Status: AC
  Administered 2017-10-22: 20 mg via INTRAVENOUS

## 2017-10-22 MED ORDER — SODIUM CHLORIDE 0.9 % IV SOLN
258.2000 mg | Freq: Once | INTRAVENOUS | Status: AC
  Administered 2017-10-22: 260 mg via INTRAVENOUS
  Filled 2017-10-22: qty 26

## 2017-10-22 MED ORDER — SODIUM CHLORIDE 0.9 % IV SOLN
80.0000 mg/m2 | Freq: Once | INTRAVENOUS | Status: AC
  Administered 2017-10-22: 156 mg via INTRAVENOUS
  Filled 2017-10-22: qty 26

## 2017-10-22 MED ORDER — HEPARIN SOD (PORK) LOCK FLUSH 100 UNIT/ML IV SOLN
500.0000 [IU] | Freq: Once | INTRAVENOUS | Status: AC | PRN
  Administered 2017-10-22: 500 [IU]
  Filled 2017-10-22: qty 5

## 2017-10-22 MED ORDER — FAMOTIDINE IN NACL 20-0.9 MG/50ML-% IV SOLN
INTRAVENOUS | Status: AC
  Filled 2017-10-22: qty 50

## 2017-10-22 MED ORDER — DIPHENHYDRAMINE HCL 50 MG/ML IJ SOLN
25.0000 mg | Freq: Once | INTRAMUSCULAR | Status: AC
  Administered 2017-10-22: 25 mg via INTRAVENOUS

## 2017-10-22 MED ORDER — DIPHENHYDRAMINE HCL 50 MG/ML IJ SOLN
INTRAMUSCULAR | Status: AC
  Filled 2017-10-22: qty 1

## 2017-10-22 MED ORDER — PALONOSETRON HCL INJECTION 0.25 MG/5ML
INTRAVENOUS | Status: AC
  Filled 2017-10-22: qty 5

## 2017-10-22 MED ORDER — PALONOSETRON HCL INJECTION 0.25 MG/5ML
0.2500 mg | Freq: Once | INTRAVENOUS | Status: AC
  Administered 2017-10-22: 0.25 mg via INTRAVENOUS

## 2017-10-22 MED ORDER — DEXAMETHASONE SODIUM PHOSPHATE 10 MG/ML IJ SOLN
INTRAMUSCULAR | Status: AC
  Filled 2017-10-22: qty 1

## 2017-10-22 MED ORDER — SODIUM CHLORIDE 0.9% FLUSH
10.0000 mL | INTRAVENOUS | Status: DC | PRN
  Administered 2017-10-22: 10 mL
  Filled 2017-10-22: qty 10

## 2017-10-22 NOTE — Patient Instructions (Signed)
[facility] °Discharge Instructions for Patients Receiving Chemotherapy ° °Today you received the following chemotherapy agents Taxol; Carboplatin ° °To help prevent nausea and vomiting after your treatment, we encourage you to take your nausea medication as directed °  °If you develop nausea and vomiting that is not controlled by your nausea medication, call the clinic.  ° °BELOW ARE SYMPTOMS THAT SHOULD BE REPORTED IMMEDIATELY: °· *FEVER GREATER THAN 100.5 F °· *CHILLS WITH OR WITHOUT FEVER °· NAUSEA AND VOMITING THAT IS NOT CONTROLLED WITH YOUR NAUSEA MEDICATION °· *UNUSUAL SHORTNESS OF BREATH °· *UNUSUAL BRUISING OR BLEEDING °· TENDERNESS IN MOUTH AND THROAT WITH OR WITHOUT PRESENCE OF ULCERS °· *URINARY PROBLEMS °· *BOWEL PROBLEMS °· UNUSUAL RASH °Items with * indicate a potential emergency and should be followed up as soon as possible. ° °Feel free to call the clinic should you have any questions or concerns. The clinic phone number is [phone]. ° °Please show the CHEMO ALERT CARD at check-in to the Emergency Department and triage nurse. ° ° °

## 2017-10-23 ENCOUNTER — Ambulatory Visit (HOSPITAL_COMMUNITY)
Admission: RE | Admit: 2017-10-23 | Discharge: 2017-10-23 | Disposition: A | Discharge status: Home / Self Care | Payer: 59 | Source: Ambulatory Visit | Attending: Adult Health | Admitting: Adult Health

## 2017-10-23 DIAGNOSIS — Z171 Estrogen receptor negative status [ER-]: Secondary | ICD-10-CM | POA: Diagnosis present

## 2017-10-23 DIAGNOSIS — C50412 Malignant neoplasm of upper-outer quadrant of left female breast: Secondary | ICD-10-CM | POA: Insufficient documentation

## 2017-10-23 MED ORDER — GADOBENATE DIMEGLUMINE 529 MG/ML IV SOLN
20.0000 mL | Freq: Once | INTRAVENOUS | Status: AC | PRN
  Administered 2017-10-23: 16 mL via INTRAVENOUS

## 2017-10-28 ENCOUNTER — Other Ambulatory Visit: Payer: Self-pay | Admitting: General Surgery

## 2017-10-28 ENCOUNTER — Other Ambulatory Visit: Payer: Self-pay | Admitting: *Deleted

## 2017-10-28 ENCOUNTER — Encounter (HOSPITAL_BASED_OUTPATIENT_CLINIC_OR_DEPARTMENT_OTHER): Payer: Self-pay | Admitting: *Deleted

## 2017-10-28 DIAGNOSIS — C50412 Malignant neoplasm of upper-outer quadrant of left female breast: Secondary | ICD-10-CM

## 2017-10-28 DIAGNOSIS — Z171 Estrogen receptor negative status [ER-]: Principal | ICD-10-CM

## 2017-10-29 ENCOUNTER — Encounter (HOSPITAL_BASED_OUTPATIENT_CLINIC_OR_DEPARTMENT_OTHER): Payer: Self-pay | Admitting: *Deleted

## 2017-10-29 ENCOUNTER — Other Ambulatory Visit: Payer: Self-pay

## 2017-10-30 ENCOUNTER — Encounter: Payer: Self-pay | Admitting: Radiation Oncology

## 2017-11-11 ENCOUNTER — Telehealth: Payer: Self-pay | Admitting: *Deleted

## 2017-11-11 NOTE — Telephone Encounter (Signed)
Call received from [name] in reference to call from pharmacy in reference to potassium refill.  I told [facility] if the doctor wanted me to have refills he would have put it on there when it was ordered,  Let Dr. [name] know I am not concerned about a refill.  Called collaborative, shared cal information.Marland Kitchen

## 2017-11-11 NOTE — Progress Notes (Signed)
Ensure pre surgery drink given with instructions to complete by 0430 dos, pt verbalized understanding. 

## 2017-11-12 NOTE — Anesthesia Preprocedure Evaluation (Addendum)
Anesthesia Evaluation  Patient identified by MRN, date of birth, ID band Patient awake    Reviewed: Allergy & Precautions, NPO status , Patient's Chart, lab work & pertinent test results  Airway Mallampati: II  TM Distance: <3 FB Neck ROM: Full    Dental  (+) Teeth Intact, Dental Advisory Given   Pulmonary neg pulmonary ROS, former smoker,    Pulmonary exam normal breath sounds clear to auscultation       Cardiovascular negative cardio ROS Normal cardiovascular exam Rhythm:Regular Rate:Normal     Neuro/Psych  Headaches, PSYCHIATRIC DISORDERS Anxiety Depression CVA, Residual Symptoms    GI/Hepatic negative GI ROS, (+)     substance abuse  marijuana use,   Endo/Other  negative endocrine ROS  Renal/GU Renal disease     Musculoskeletal  (+) Fibromyalgia -TMJ arthritis   Abdominal   Peds  Hematology negative hematology ROS (+)   Anesthesia Other Findings Day of surgery medications reviewed with the patient.  Reproductive/Obstetrics                            Anesthesia Physical Anesthesia Plan  ASA: III  Anesthesia Plan: General   Post-op Pain Management:  Regional for Post-op pain   Induction: Intravenous  PONV Risk Score and Plan: 3 and Midazolam, Dexamethasone and Ondansetron  Airway Management Planned: LMA  Additional Equipment:   Intra-op Plan:   Post-operative Plan: Extubation in OR  Informed Consent: I have reviewed the patients History and Physical, chart, labs and discussed the procedure including the risks, benefits and alternatives for the proposed anesthesia with the patient or authorized representative who has indicated his/her understanding and acceptance.   Dental advisory given  Plan Discussed with: CRNA  Anesthesia Plan Comments:        Anesthesia Quick Evaluation

## 2017-11-13 ENCOUNTER — Other Ambulatory Visit: Payer: Self-pay

## 2017-11-13 ENCOUNTER — Ambulatory Visit (HOSPITAL_BASED_OUTPATIENT_CLINIC_OR_DEPARTMENT_OTHER): Payer: 59 | Admitting: Anesthesiology

## 2017-11-13 ENCOUNTER — Encounter (HOSPITAL_BASED_OUTPATIENT_CLINIC_OR_DEPARTMENT_OTHER): Payer: Self-pay | Admitting: Anesthesiology

## 2017-11-13 ENCOUNTER — Encounter (HOSPITAL_BASED_OUTPATIENT_CLINIC_OR_DEPARTMENT_OTHER)
Admission: RE | Disposition: A | Discharge status: Home / Self Care | Payer: Self-pay | Source: Ambulatory Visit | Attending: General Surgery

## 2017-11-13 ENCOUNTER — Ambulatory Visit (HOSPITAL_BASED_OUTPATIENT_CLINIC_OR_DEPARTMENT_OTHER)
Admission: RE | Admit: 2017-11-13 | Discharge: 2017-11-13 | Disposition: A | Discharge status: Home / Self Care | Payer: 59 | Source: Ambulatory Visit | Attending: General Surgery | Admitting: General Surgery

## 2017-11-13 ENCOUNTER — Encounter (HOSPITAL_COMMUNITY)
Admission: RE | Admit: 2017-11-13 | Discharge: 2017-11-13 | Disposition: A | Discharge status: Home / Self Care | Payer: 59 | Source: Ambulatory Visit | Attending: General Surgery | Admitting: General Surgery

## 2017-11-13 DIAGNOSIS — Z79891 Long term (current) use of opiate analgesic: Secondary | ICD-10-CM | POA: Diagnosis not present

## 2017-11-13 DIAGNOSIS — C50412 Malignant neoplasm of upper-outer quadrant of left female breast: Secondary | ICD-10-CM | POA: Diagnosis present

## 2017-11-13 DIAGNOSIS — F419 Anxiety disorder, unspecified: Secondary | ICD-10-CM | POA: Diagnosis not present

## 2017-11-13 DIAGNOSIS — Z7951 Long term (current) use of inhaled steroids: Secondary | ICD-10-CM | POA: Diagnosis not present

## 2017-11-13 DIAGNOSIS — Z79899 Other long term (current) drug therapy: Secondary | ICD-10-CM | POA: Diagnosis not present

## 2017-11-13 DIAGNOSIS — M797 Fibromyalgia: Secondary | ICD-10-CM | POA: Insufficient documentation

## 2017-11-13 DIAGNOSIS — Z87891 Personal history of nicotine dependence: Secondary | ICD-10-CM | POA: Diagnosis not present

## 2017-11-13 DIAGNOSIS — F329 Major depressive disorder, single episode, unspecified: Secondary | ICD-10-CM | POA: Diagnosis not present

## 2017-11-13 DIAGNOSIS — Z171 Estrogen receptor negative status [ER-]: Principal | ICD-10-CM

## 2017-11-13 HISTORY — PX: BREAST LUMPECTOMY WITH AXILLARY LYMPH NODE BIOPSY: SHX5593

## 2017-11-13 SURGERY — BREAST LUMPECTOMY WITH AXILLARY LYMPH NODE BIOPSY
Anesthesia: General | Site: Breast | Laterality: Left

## 2017-11-13 MED ORDER — CLONIDINE HCL (ANALGESIA) 100 MCG/ML EP SOLN
EPIDURAL | Status: DC | PRN
  Administered 2017-11-13: 50 ug

## 2017-11-13 MED ORDER — SCOPOLAMINE 1 MG/3DAYS TD PT72: 1.0000 | MEDICATED_PATCH | Freq: Once | TRANSDERMAL | Status: DC | PRN

## 2017-11-13 MED ORDER — LIDOCAINE HCL (CARDIAC) PF 100 MG/5ML IV SOSY
PREFILLED_SYRINGE | INTRAVENOUS | Status: DC | PRN
  Administered 2017-11-13: 30 mg via INTRAVENOUS

## 2017-11-13 MED ORDER — MIDAZOLAM HCL 2 MG/2ML IJ SOLN
1.0000 mg | INTRAMUSCULAR | Status: DC | PRN
  Administered 2017-11-13: 2 mg via INTRAVENOUS

## 2017-11-13 MED ORDER — MIDAZOLAM HCL 2 MG/2ML IJ SOLN
INTRAMUSCULAR | Status: AC
  Filled 2017-11-13: qty 2

## 2017-11-13 MED ORDER — GABAPENTIN 100 MG PO CAPS
100.0000 mg | ORAL_CAPSULE | ORAL | Status: AC
  Administered 2017-11-13: 100 mg via ORAL

## 2017-11-13 MED ORDER — ONDANSETRON HCL 4 MG/2ML IJ SOLN
INTRAMUSCULAR | Status: AC
  Filled 2017-11-13: qty 2

## 2017-11-13 MED ORDER — DEXAMETHASONE SODIUM PHOSPHATE 10 MG/ML IJ SOLN
INTRAMUSCULAR | Status: AC
  Filled 2017-11-13: qty 1

## 2017-11-13 MED ORDER — 0.9 % SODIUM CHLORIDE (POUR BTL) OPTIME
TOPICAL | Status: DC | PRN
  Administered 2017-11-13: 1000 mL

## 2017-11-13 MED ORDER — ONDANSETRON HCL 4 MG/2ML IJ SOLN: 4.0000 mg | Freq: Once | INTRAMUSCULAR | Status: DC | PRN

## 2017-11-13 MED ORDER — EPHEDRINE 5 MG/ML INJ
INTRAVENOUS | Status: AC
  Filled 2017-11-13: qty 10

## 2017-11-13 MED ORDER — FENTANYL CITRATE (PF) 100 MCG/2ML IJ SOLN
INTRAMUSCULAR | Status: AC
  Filled 2017-11-13: qty 2

## 2017-11-13 MED ORDER — CEFAZOLIN SODIUM-DEXTROSE 2-4 GM/100ML-% IV SOLN
INTRAVENOUS | Status: AC
  Filled 2017-11-13: qty 100

## 2017-11-13 MED ORDER — TECHNETIUM TC 99M SULFUR COLLOID FILTERED
1.0000 | Freq: Once | INTRAVENOUS | Status: AC | PRN
  Administered 2017-11-13: 1 via INTRADERMAL

## 2017-11-13 MED ORDER — PHENYLEPHRINE HCL 10 MG/ML IJ SOLN
INTRAMUSCULAR | Status: DC | PRN
  Administered 2017-11-13: 160 ug via INTRAVENOUS
  Administered 2017-11-13 (×2): 80 ug via INTRAVENOUS

## 2017-11-13 MED ORDER — LIDOCAINE HCL (CARDIAC) PF 100 MG/5ML IV SOSY
PREFILLED_SYRINGE | INTRAVENOUS | Status: AC
  Filled 2017-11-13: qty 5

## 2017-11-13 MED ORDER — BUPIVACAINE-EPINEPHRINE (PF) 0.5% -1:200000 IJ SOLN
INTRAMUSCULAR | Status: DC | PRN
  Administered 2017-11-13: 30 mL via PERINEURAL

## 2017-11-13 MED ORDER — GABAPENTIN 100 MG PO CAPS
ORAL_CAPSULE | ORAL | Status: AC
  Filled 2017-11-13: qty 1

## 2017-11-13 MED ORDER — KETOROLAC TROMETHAMINE 15 MG/ML IJ SOLN
15.0000 mg | INTRAMUSCULAR | Status: AC
  Administered 2017-11-13: 15 mg via INTRAVENOUS

## 2017-11-13 MED ORDER — PROPOFOL 10 MG/ML IV BOLUS
INTRAVENOUS | Status: AC
  Filled 2017-11-13: qty 20

## 2017-11-13 MED ORDER — CEFAZOLIN SODIUM-DEXTROSE 2-4 GM/100ML-% IV SOLN
2.0000 g | INTRAVENOUS | Status: AC
  Administered 2017-11-13: 2 g via INTRAVENOUS

## 2017-11-13 MED ORDER — ACETAMINOPHEN 500 MG PO TABS
1000.0000 mg | ORAL_TABLET | ORAL | Status: AC
  Administered 2017-11-13: 1000 mg via ORAL

## 2017-11-13 MED ORDER — BUPIVACAINE-EPINEPHRINE (PF) 0.5% -1:200000 IJ SOLN
INTRAMUSCULAR | Status: AC
  Filled 2017-11-13: qty 30

## 2017-11-13 MED ORDER — MIDAZOLAM HCL 5 MG/5ML IJ SOLN
INTRAMUSCULAR | Status: DC | PRN
  Administered 2017-11-13: 2 mg via INTRAVENOUS

## 2017-11-13 MED ORDER — ACETAMINOPHEN 500 MG PO TABS
ORAL_TABLET | ORAL | Status: AC
  Filled 2017-11-13: qty 2

## 2017-11-13 MED ORDER — BUPIVACAINE-EPINEPHRINE (PF) 0.25% -1:200000 IJ SOLN
INTRAMUSCULAR | Status: AC
  Filled 2017-11-13: qty 60

## 2017-11-13 MED ORDER — ENSURE PRE-SURGERY PO LIQD: 296.0000 mL | Freq: Once | ORAL | Status: DC

## 2017-11-13 MED ORDER — PROPOFOL 10 MG/ML IV BOLUS
INTRAVENOUS | Status: DC | PRN
  Administered 2017-11-13: 150 mg via INTRAVENOUS

## 2017-11-13 MED ORDER — LACTATED RINGERS IV SOLN
INTRAVENOUS | Status: DC
  Administered 2017-11-13 (×2): via INTRAVENOUS

## 2017-11-13 MED ORDER — PHENYLEPHRINE 40 MCG/ML (10ML) SYRINGE FOR IV PUSH (FOR BLOOD PRESSURE SUPPORT)
PREFILLED_SYRINGE | INTRAVENOUS | Status: AC
  Filled 2017-11-13: qty 10

## 2017-11-13 MED ORDER — DEXAMETHASONE SODIUM PHOSPHATE 4 MG/ML IJ SOLN
INTRAMUSCULAR | Status: DC | PRN
  Administered 2017-11-13: 10 mg via INTRAVENOUS

## 2017-11-13 MED ORDER — PROPOFOL 500 MG/50ML IV EMUL
INTRAVENOUS | Status: AC
  Filled 2017-11-13: qty 50

## 2017-11-13 MED ORDER — FENTANYL CITRATE (PF) 100 MCG/2ML IJ SOLN: 25.0000 ug | INTRAMUSCULAR | Status: DC | PRN

## 2017-11-13 MED ORDER — FENTANYL CITRATE (PF) 100 MCG/2ML IJ SOLN
INTRAMUSCULAR | Status: DC | PRN
  Administered 2017-11-13: 100 ug via INTRAVENOUS

## 2017-11-13 MED ORDER — SUCCINYLCHOLINE CHLORIDE 200 MG/10ML IV SOSY
PREFILLED_SYRINGE | INTRAVENOUS | Status: AC
  Filled 2017-11-13: qty 10

## 2017-11-13 MED ORDER — OXYCODONE-ACETAMINOPHEN 10-325 MG PO TABS: 1.0000 | ORAL_TABLET | Freq: Four times a day (QID) | ORAL | 0 refills | Status: DC | PRN

## 2017-11-13 MED ORDER — KETOROLAC TROMETHAMINE 15 MG/ML IJ SOLN
INTRAMUSCULAR | Status: AC
  Filled 2017-11-13: qty 1

## 2017-11-13 MED ORDER — ONDANSETRON HCL 4 MG/2ML IJ SOLN
INTRAMUSCULAR | Status: DC | PRN
  Administered 2017-11-13: 4 mg via INTRAVENOUS

## 2017-11-13 MED ORDER — BUPIVACAINE-EPINEPHRINE 0.25% -1:200000 IJ SOLN
INTRAMUSCULAR | Status: DC | PRN
  Administered 2017-11-13: 10 mL

## 2017-11-13 MED ORDER — FENTANYL CITRATE (PF) 100 MCG/2ML IJ SOLN
50.0000 ug | INTRAMUSCULAR | Status: DC | PRN
  Administered 2017-11-13 (×2): 50 ug via INTRAVENOUS

## 2017-11-13 SURGICAL SUPPLY — 61 items
APPLIER CLIP 9.375 MED OPEN (MISCELLANEOUS) ×3
BINDER BREAST LRG (GAUZE/BANDAGES/DRESSINGS) IMPLANT
BINDER BREAST MEDIUM (GAUZE/BANDAGES/DRESSINGS) IMPLANT
BINDER BREAST XLRG (GAUZE/BANDAGES/DRESSINGS) ×3 IMPLANT
BINDER BREAST XXLRG (GAUZE/BANDAGES/DRESSINGS) IMPLANT
BLADE SURG 15 STRL LF DISP TIS (BLADE) ×1 IMPLANT
BLADE SURG 15 STRL SS (BLADE) ×2
CANISTER SUCT 1200ML W/VALVE (MISCELLANEOUS) ×3 IMPLANT
CHLORAPREP W/TINT 26ML (MISCELLANEOUS) ×3 IMPLANT
CLIP APPLIE 9.375 MED OPEN (MISCELLANEOUS) ×1 IMPLANT
CLIP VESOCCLUDE SM WIDE 6/CT (CLIP) IMPLANT
CLOSURE WOUND 1/2 X4 (GAUZE/BANDAGES/DRESSINGS) ×2
COVER BACK TABLE 60X90IN (DRAPES) ×3 IMPLANT
COVER MAYO STAND STRL (DRAPES) ×3 IMPLANT
COVER PROBE W GEL 5X96 (DRAPES) ×3 IMPLANT
DECANTER SPIKE VIAL GLASS SM (MISCELLANEOUS) IMPLANT
DERMABOND ADVANCED (GAUZE/BANDAGES/DRESSINGS) ×2
DERMABOND ADVANCED .7 DNX12 (GAUZE/BANDAGES/DRESSINGS) ×1 IMPLANT
DEVICE DUBIN W/COMP PLATE 8390 (MISCELLANEOUS) ×3 IMPLANT
DRAIN CHANNEL 19F RND (DRAIN) IMPLANT
DRAPE LAPAROSCOPIC ABDOMINAL (DRAPES) ×3 IMPLANT
DRSG TEGADERM 4X4.75 (GAUZE/BANDAGES/DRESSINGS) ×3 IMPLANT
ELECT COATED BLADE 2.86 ST (ELECTRODE) ×3 IMPLANT
ELECT REM PT RETURN 9FT ADLT (ELECTROSURGICAL) ×3
ELECTRODE REM PT RTRN 9FT ADLT (ELECTROSURGICAL) ×1 IMPLANT
EVACUATOR SILICONE 100CC (DRAIN) IMPLANT
GAUZE SPONGE 4X4 12PLY STRL LF (GAUZE/BANDAGES/DRESSINGS) IMPLANT
GLOVE BIO SURGEON STRL SZ7 (GLOVE) ×3 IMPLANT
GLOVE BIOGEL PI IND STRL 7.5 (GLOVE) ×1 IMPLANT
GLOVE BIOGEL PI INDICATOR 7.5 (GLOVE) ×2
GOWN STRL REUS W/ TWL LRG LVL3 (GOWN DISPOSABLE) ×3 IMPLANT
GOWN STRL REUS W/TWL LRG LVL3 (GOWN DISPOSABLE) ×6
ILLUMINATOR WAVEGUIDE N/F (MISCELLANEOUS) IMPLANT
KIT MARKER MARGIN INK (KITS) ×3 IMPLANT
LIGHT WAVEGUIDE WIDE FLAT (MISCELLANEOUS) IMPLANT
NDL SAFETY ECLIPSE 18X1.5 (NEEDLE) IMPLANT
NEEDLE HYPO 18GX1.5 SHARP (NEEDLE)
NEEDLE HYPO 25X1 1.5 SAFETY (NEEDLE) ×3 IMPLANT
NS IRRIG 1000ML POUR BTL (IV SOLUTION) IMPLANT
PACK BASIN DAY SURGERY FS (CUSTOM PROCEDURE TRAY) ×3 IMPLANT
PENCIL BUTTON HOLSTER BLD 10FT (ELECTRODE) ×3 IMPLANT
PIN SAFETY STERILE (MISCELLANEOUS) IMPLANT
SLEEVE SCD COMPRESS KNEE MED (MISCELLANEOUS) ×3 IMPLANT
SPONGE LAP 18X18 RF (DISPOSABLE) IMPLANT
SPONGE LAP 4X18 X RAY DECT (DISPOSABLE) ×6 IMPLANT
STRIP CLOSURE SKIN 1/2X4 (GAUZE/BANDAGES/DRESSINGS) ×4 IMPLANT
SUT MNCRL AB 4-0 PS2 18 (SUTURE) ×6 IMPLANT
SUT MON AB 5-0 PS2 18 (SUTURE) IMPLANT
SUT SILK 2 0 SH (SUTURE) IMPLANT
SUT VIC AB 2-0 SH 27 (SUTURE) ×8
SUT VIC AB 2-0 SH 27XBRD (SUTURE) ×4 IMPLANT
SUT VIC AB 3-0 SH 27 (SUTURE) ×4
SUT VIC AB 3-0 SH 27X BRD (SUTURE) ×2 IMPLANT
SUT VIC AB 5-0 PS2 18 (SUTURE) IMPLANT
SUT VICRYL AB 3 0 TIES (SUTURE) IMPLANT
SYR CONTROL 10ML LL (SYRINGE) ×3 IMPLANT
TOWEL OR 17X24 6PK STRL BLUE (TOWEL DISPOSABLE) ×3 IMPLANT
TOWEL OR NON WOVEN STRL DISP B (DISPOSABLE) IMPLANT
TUBE CONNECTING 20'X1/4 (TUBING) ×1
TUBE CONNECTING 20X1/4 (TUBING) ×2 IMPLANT
YANKAUER SUCT BULB TIP NO VENT (SUCTIONS) ×3 IMPLANT

## 2017-11-13 NOTE — Anesthesia Procedure Notes (Signed)
Procedure Name: LMA Insertion Date/Time: 11/13/2017 8:34 AM Performed by: Marrianne Mood, CRNA Pre-anesthesia Checklist: Patient identified, Emergency Drugs available, Suction available, Patient being monitored and Timeout performed Patient Re-evaluated:Patient Re-evaluated prior to induction Oxygen Delivery Method: Circle system utilized Preoxygenation: Pre-oxygenation with 100% oxygen Induction Type: IV induction Ventilation: Mask ventilation without difficulty LMA: LMA inserted LMA Size: 4.0 Number of attempts: 1 Airway Equipment and Method: Bite block Placement Confirmation: positive ETCO2 Tube secured with: Tape Dental Injury: Teeth and Oropharynx as per pre-operative assessment

## 2017-11-13 NOTE — Progress Notes (Signed)
Assisted Dr. Turk with left, ultrasound guided, pectoralis block. Side rails up, monitors on throughout procedure. See vital signs in flow sheet. Tolerated Procedure well. 

## 2017-11-13 NOTE — Op Note (Signed)
Preoperative diagnosis: Left breast cancer s/p primary chemotherapy Postoperative diagnosis: same as above Procedure:Leftbreast lumpectomy Leftdeep axillary sentinel node biopsy Surgeon: Dr Serita Grammes WCB:[id number] Anes: general  Specimens  1.leftbreast tissue marked with paint containing biopsy clip 2.leftaxillary sentinel nodes with highest [id number] Complications none Drains none Sponge count correct Dispo to pacu stable  Indications: This is a 89 yof with clinical stage I left breast cancer s/p primary chemotherapy with minimal response. We discussed all options and elected to proceed with lumpectomy/sn biopsy.  Procedure: After informed consent was obtained the patient was taken to the operating room. She first was given technetium in standard periareolar fashion. She had a pectoral block. She was given antibiotics. Sequential compression devices were on her legs. She was then placed under general anesthesia with an LMA. Then she was prepped and draped in the standard sterile surgical fashion. Surgical timeout was then performed.  The tumor was in the superior pole of left breast and was still fairly large. I infiltrated marcaine in the skin. I made an elliptical incision including some skin. I then excised the tumor with an attempt to remove a clear margin down to the muscle.  I marked this with paint. MM confirmed removal of  the clip. I placed clips in the cavity.I then obtained hemostasis. This was marked as above.I closed with2-0 vicryl to approximate breast tissue. The skin was closed with 3-0 vicryl and4-0 monocryl. Glue and steristrips were placed.   I then made a low axillary incision after locating the sentinel nodes.There was radioactivity present that was easily noted. I removed the radioactive nodes containing technetium. The background radioactivity was minimal.I then obtained hemostasis. I closed the axillary fascia with 2-0 vicryl.The  skin was closed with 3-0 vicryl and 4-0 monocryl. Glue and steristrips were applied. She tolerated well, was extubated and transferred to pacu stable.

## 2017-11-13 NOTE — Anesthesia Postprocedure Evaluation (Signed)
Anesthesia Post Note  Patient: Amy Dawson  Procedure(s) Performed: LEFT BREAST LUMPECTOMY WITH SENTINEL LYMPH NODE BIOPSY (Left Breast)     Patient location during evaluation: PACU Anesthesia Type: General Level of consciousness: awake and alert Pain management: pain level controlled Vital Signs Assessment: post-procedure vital signs reviewed and stable Respiratory status: spontaneous breathing, nonlabored ventilation and respiratory function stable Cardiovascular status: blood pressure returned to baseline and stable Postop Assessment: no apparent nausea or vomiting Anesthetic complications: no    Last Vitals:  Vitals:   11/13/17 1015 11/13/17 1038  BP:  127/86  Pulse: 84 90  Resp: 10 18  Temp:  37 C  SpO2: 99% 100%    Last Pain:  Vitals:   11/13/17 1038  TempSrc:   PainSc: 0-No pain                 Catalina Gravel

## 2017-11-13 NOTE — Anesthesia Procedure Notes (Signed)
Anesthesia Regional Block: Pectoralis block   Pre-Anesthetic Checklist: ,, timeout performed, Correct Patient, Correct Site, Correct Laterality, Correct Procedure, Correct Position, site marked, Risks and benefits discussed,  Surgical consent,  Pre-op evaluation,  At surgeon's request and post-op pain management  Laterality: Left  Prep: chloraprep       Needles:  Injection technique: Single-shot  Needle Type: Echogenic Needle     Needle Length: 9cm  Needle Gauge: 21     Additional Needles:   Procedures:,,,, ultrasound used (permanent image in chart),,,,  Narrative:  Start time: 11/13/2017 7:42 AM End time: 11/13/2017 7:47 AM Injection made incrementally with aspirations every 5 mL.  Performed by: Personally  Anesthesiologist: Catalina Gravel, MD  Additional Notes: No pain on injection. No increased resistance to injection. Injection made in 5cc increments.  Good needle visualization.  Patient tolerated procedure well.

## 2017-11-13 NOTE — H&P (Signed)
37 yof I saw last year. she noted a left breast mass since october. not really changed. has fh in mgm and aunt. she has c density breasts. has a 2.9x2.5x2.2 cm mass with a negative axilla. core biopsy is grade II-III IDC that is triple negative and Ki is 80%. she has prior cerebral hemorrhage with some residual left sided numbness. I put a port in and she has undergone primary chemotherapy with act. she completed this on 4/9. she tolerated this fairly well. genetic testing was negative. she can still feel this area. she had repeat mri that showed a 2.2x1.6x1.8 cm in size with negative nodes with nl right breast. she is here to discuss options   Past Surgical History [name], MD; 10/28/2017 1:56 PM) Breast Biopsy  Left. Cesarean Section - 1   Diagnostic Studies History [name], MD; 10/28/2017 1:56 PM) Colonoscopy  1-5 years ago Mammogram  within last year Pap Smear  1-5 years ago  Allergies Sabino Gasser; 10/28/2017 10:06 AM) No Known Drug Allergies [10/28/2017]: Allergies Reconciled   Medication History Sabino Gasser; 10/28/2017 10:06 AM) Amitriptyline HCl (50MG  Tablet, Oral) Active. Azelastine HCl (0.05% Solution, Ophthalmic) Active. Fluticasone Furoate (100MCG/ACT Aero Pow Br Act, Inhalation) Active. ZyrTEC Allergy (10MG  Tablet, Oral) Active. Zoloft (100MG  Tablet, Oral) Active. Optivar (0.05% Solution, Ophthalmic) Active. Medications Reconciled  Social History [name], MD; 10/28/2017 1:56 PM) Alcohol use  Occasional alcohol use. Caffeine use  Coffee. Illicit drug use  Uses daily. Tobacco use  Former smoker.  Family History [name], MD; 10/28/2017 1:56 PM) Alcohol Abuse  Family Members In General. Arthritis  Family Members In General. Bleeding disorder  Family Members In General. Breast Cancer  Family Members In General. Cerebrovascular Accident  Family Members In General. Colon Polyps  Family Members In  General. Depression  Family Members In General. Diabetes Mellitus  Family Members In General. Hypertension  Family Members In General. Melanoma  Family Members In General. Migraine Headache  Family Members In [place] Sabino Gasser; 10/28/2017 10:07 AM) 10/28/2017 10:06 AM Weight: 182.38 lb Height: 68in Body Surface Area: 1.97 m Body Mass Index: 27.73 kg/m  Temp.: 98.24F(Oral)  Pulse: 105 (Regular)  BP: 110/74 (Sitting, Left Arm, Standard) Physical Exam [name] MD; 10/28/2017 1:55 PM) General Mental Status-Alert. Orientation-Oriented X3. Chest and Lung Exam Chest and lung exam reveals -quiet, even and easy respiratory effort with no use of accessory muscles and on auscultation, normal breath sounds, no adventitious sounds and normal vocal resonance. Breast Nipples-No Discharge. Note: 2.2 cm mass at superior pole Cardiovascular Cardiovascular examination reveals -normal heart sounds, regular rate and rhythm with no murmurs. Lymphatic Head & Neck General Head & Neck Lymphatics: Bilateral - Description - Normal. Axillary General Axillary Region: Bilateral - Description - Normal. Note: no [place] adenopathy   Assessment & Plan [name] MD; 10/28/2017 1:56 PM) CARCINOMA OF UPPER-OUTER QUADRANT OF LEFT FEMALE BREAST (C50.412) Story: left breast lumpectomy, left axillary sentinel node biopsy will schedule in a couple weeks after completing chemotherapy We discussed a sentinel lymph node biopsy as she does not appear to having lymph node involvement right now. We discussed the performance of that with injection of radioactive tracer and blue dye. We discussed that she would have an incision underneath her axillary hairline. We discussed that there is a chance of having a positive node with a sentinel lymph node biopsy and we will await the permanent pathology to make any other first further decisions in terms of her  treatment. One of these options might be to return to the operating room to perform an axillary lymph node dissection. We discussed up to a 5% risk lifetime of chronic shoulder pain as well as lymphedema associated with a sentinel lymph node biopsy. We discussed the options for treatment of the breast cancer which included lumpectomy versus a mastectomy. We discussed the performance of the lumpectomy with radioactive seed placement. We discussed a 5-10% chance of a positive margin requiring reexcision in the operating room. We also discussed that she will need radiation therapy if she undergoes lumpectomy. We discussed the mastectomy and the postoperative care for that as well. Mastectomy can be followed by reconstruction. This is a more extensive surgery and requires more recovery. The decision for lumpectomy vs mastectomy has no impact on decision for chemotherapy. Most mastectomy patients will not need radiation therapy. We discussed that there is no difference in her survival whether she undergoes lumpectomy with radiation therapy or antiestrogen therapy versus a mastectomy. There is also no real difference between her recurrence in the breast. We discussed the risks of surgery.

## 2017-11-13 NOTE — Transfer of Care (Signed)
Immediate Anesthesia Transfer of Care Note  Patient: Amy Dawson  Procedure(s) Performed: LEFT BREAST LUMPECTOMY WITH SENTINEL LYMPH NODE BIOPSY (Left Breast)  Patient Location: PACU  Anesthesia Type:General and GA combined with regional for post-op pain  Level of Consciousness: awake and patient cooperative  Airway & Oxygen Therapy: Patient Spontanous Breathing and Patient connected to face mask oxygen  Post-op Assessment: Report given to RN and Post -op Vital signs reviewed and stable  Post vital signs: Reviewed and stable  Last Vitals:  Vitals Value Taken Time  BP    Temp    Pulse 80 11/13/2017  9:40 AM  Resp 16 11/13/2017  9:40 AM  SpO2 100 % 11/13/2017  9:40 AM  Vitals shown include unvalidated device data.  Last Pain:  Vitals:   11/13/17 0716  TempSrc: Oral         Complications: No apparent anesthesia complications

## 2017-11-13 NOTE — Interval H&P Note (Signed)
History and Physical Interval Note:  11/13/2017 8:14 AM  Amy Dawson  has presented today for surgery, with the diagnosis of LEFT BREAST CANCER  The various methods of treatment have been discussed with the patient and family. After consideration of risks, benefits and other options for treatment, the patient has consented to  Procedure(s): LEFT BREAST LUMPECTOMY WITH SENTINEL LYMPH NODE BIOPSY (Left) as a surgical intervention .  The patient's history has been reviewed, patient examined, no change in status, stable for surgery.  I have reviewed the patient's chart and labs.  Questions were answered to the patient's satisfaction.     [name]

## 2017-11-13 NOTE — Discharge Instructions (Signed)
[place] Office Phone Number [phone]  POST OP INSTRUCTIONS  Always review your discharge instruction sheet given to you by the facility where your surgery was performed.  IF YOU HAVE DISABILITY OR FAMILY LEAVE FORMS, YOU MUST BRING THEM TO THE OFFICE FOR PROCESSING.  DO NOT GIVE THEM TO YOUR DOCTOR.  1. A prescription for pain medication may be given to you upon discharge.  Take your pain medication as prescribed, if needed.  If narcotic pain medicine is not needed, then you may take acetaminophen (Tylenol), naprosyn (Alleve) or ibuprofen (Advil) as needed. Next Dose of Tylenol at 1:30pm if needed. Next Dose of Ibuprofen/Motrin at 1:30pm if needed. 2. Take your usually prescribed medications unless otherwise directed 3. If you need a refill on your pain medication, please contact your pharmacy.  They will contact our office to request authorization.  Prescriptions will not be filled after 5pm or on week-ends. 4. You should eat very light the first 24 hours after surgery, such as soup, crackers, pudding, etc.  Resume your normal diet the day after surgery. 5. Most patients will experience some swelling and bruising in the breast.  Ice packs and a good support bra will help.  Wear the breast binder provided or a sports bra for 72 hours day and night.  After that wear a sports bra during the day until you return to the office. Swelling and bruising can take several days to resolve.  6. It is common to experience some constipation if taking pain medication after surgery.  Increasing fluid intake and taking a stool softener will usually help or prevent this problem from occurring.  A mild laxative (Milk of Magnesia or Miralax) should be taken according to package directions if there are no bowel movements after 48 hours. 7. Unless discharge instructions indicate otherwise, you may remove your bandages 48 hours after surgery and you may shower at that time.  You may have steri-strips  (small skin tapes) in place directly over the incision.  These strips should be left on the skin for 7-10 days and will come off on their own.  If your surgeon used skin glue on the incision, you may shower in 24 hours.  The glue will flake off over the next 2-3 weeks.  Any sutures or staples will be removed at the office during your follow-up visit. 8. ACTIVITIES:  You may resume regular daily activities (gradually increasing) beginning the next day.  Wearing a good support bra or sports bra minimizes pain and swelling.  You may have sexual intercourse when it is comfortable. a. You may drive when you no longer are taking prescription pain medication, you can comfortably wear a seatbelt, and you can safely maneuver your car and apply brakes. b. RETURN TO WORK:  ______________________________________________________________________________________ 9. You should see your doctor in the office for a follow-up appointment approximately two weeks after your surgery.  Your doctors nurse will typically make your follow-up appointment when she calls you with your pathology report.  Expect your pathology report 3-4 business days after your surgery.  You may call to check if you do not hear from [place] after three days. 10. OTHER INSTRUCTIONS: _______________________________________________________________________________________________ _____________________________________________________________________________________________________________________________________ _____________________________________________________________________________________________________________________________________ _____________________________________________________________________________________________________________________________________  WHEN TO CALL DR WAKEFIELD: 1. Fever over 101.0 2. Nausea and/or vomiting. 3. Extreme swelling or bruising. 4. Continued bleeding from incision. 5. Increased pain, redness, or drainage  from the incision.  The clinic staff is available to answer your questions during regular business hours.  Please dont hesitate  to call and ask to speak to one of the nurses for clinical concerns.  If you have a medical emergency, go to the nearest emergency room or call 911.  A surgeon from [facility] is always on call at the hospital.  For further questions, please visit [email or web address]   Post Anesthesia Home Care Instructions  Activity: Get plenty of rest for the remainder of the day. A responsible individual must stay with you for 24 hours following the procedure.  For the next 24 hours, DO NOT: -Drive a car -Paediatric nurse -Drink alcoholic beverages -Take any medication unless instructed by your physician -Make any legal decisions or sign important papers.  Meals: Start with liquid foods such as gelatin or soup. Progress to regular foods as tolerated. Avoid greasy, spicy, heavy foods. If nausea and/or vomiting occur, drink only clear liquids until the nausea and/or vomiting subsides. Call your physician if vomiting continues.  Special Instructions/Symptoms: Your throat may feel dry or sore from the anesthesia or the breathing tube placed in your throat during surgery. If this causes discomfort, gargle with warm salt water. The discomfort should disappear within 24 hours.  If you had a scopolamine patch placed behind your ear for the management of post- operative nausea and/or vomiting:  1. The medication in the patch is effective for 72 hours, after which it should be removed.  Wrap patch in a tissue and discard in the trash. Wash hands thoroughly with soap and water. 2. You may remove the patch earlier than 72 hours if you experience unpleasant side effects which may include dry mouth, dizziness or visual disturbances. 3. Avoid touching the patch. Wash your hands with soap and water after contact with the patch.   NO IBUPROFEN OR TYLENOL UNTIL  AFTER 2PM!!!

## 2017-11-13 NOTE — Progress Notes (Signed)
Nuc med staff performed nuc med inj. Pt tol well with additional fentanyl IV for comfort. VSS (see flowsheet). Will call husband, [name], back to bedside and update/provide emotional support.

## 2017-11-14 ENCOUNTER — Encounter (HOSPITAL_BASED_OUTPATIENT_CLINIC_OR_DEPARTMENT_OTHER): Payer: Self-pay | Admitting: General Surgery

## 2017-11-27 ENCOUNTER — Encounter: Payer: Self-pay | Admitting: Adult Health

## 2017-11-27 ENCOUNTER — Telehealth: Payer: Self-pay | Admitting: Pharmacist

## 2017-11-27 ENCOUNTER — Encounter: Payer: Self-pay | Admitting: Radiation Oncology

## 2017-11-27 ENCOUNTER — Inpatient Hospital Stay: Payer: 59 | Attending: Oncology | Admitting: Adult Health

## 2017-11-27 ENCOUNTER — Other Ambulatory Visit: Payer: Self-pay

## 2017-11-27 ENCOUNTER — Ambulatory Visit
Admission: RE | Admit: 2017-11-27 | Discharge: 2017-11-27 | Disposition: A | Discharge status: Home / Self Care | Payer: 59 | Source: Ambulatory Visit | Attending: Radiation Oncology | Admitting: Radiation Oncology

## 2017-11-27 ENCOUNTER — Ambulatory Visit
Admission: RE | Admit: 2017-11-27 | Discharge: 2017-11-27 | Disposition: A | Discharge status: Home / Self Care | Payer: 59 | Source: Ambulatory Visit

## 2017-11-27 VITALS — BP 137/85 | HR 94 | Temp 98.4°F | Resp 18 | Ht 68.75 in | Wt 184.2 lb

## 2017-11-27 VITALS — BP 135/84 | HR 94 | Temp 98.4°F | Resp 18 | Ht 68.75 in | Wt 183.8 lb

## 2017-11-27 DIAGNOSIS — C50412 Malignant neoplasm of upper-outer quadrant of left female breast: Secondary | ICD-10-CM

## 2017-11-27 DIAGNOSIS — M797 Fibromyalgia: Secondary | ICD-10-CM | POA: Diagnosis not present

## 2017-11-27 DIAGNOSIS — Z171 Estrogen receptor negative status [ER-]: Principal | ICD-10-CM

## 2017-11-27 DIAGNOSIS — Z87891 Personal history of nicotine dependence: Secondary | ICD-10-CM | POA: Diagnosis not present

## 2017-11-27 DIAGNOSIS — R51 Headache: Secondary | ICD-10-CM | POA: Insufficient documentation

## 2017-11-27 DIAGNOSIS — Z8673 Personal history of transient ischemic attack (TIA), and cerebral infarction without residual deficits: Secondary | ICD-10-CM | POA: Diagnosis not present

## 2017-11-27 DIAGNOSIS — Z9221 Personal history of antineoplastic chemotherapy: Secondary | ICD-10-CM | POA: Diagnosis not present

## 2017-11-27 DIAGNOSIS — Z800 Family history of malignant neoplasm of digestive organs: Secondary | ICD-10-CM | POA: Insufficient documentation

## 2017-11-27 DIAGNOSIS — Z803 Family history of malignant neoplasm of breast: Secondary | ICD-10-CM | POA: Insufficient documentation

## 2017-11-27 DIAGNOSIS — Z79899 Other long term (current) drug therapy: Secondary | ICD-10-CM | POA: Diagnosis not present

## 2017-11-27 DIAGNOSIS — F418 Other specified anxiety disorders: Secondary | ICD-10-CM | POA: Diagnosis not present

## 2017-11-27 DIAGNOSIS — R011 Cardiac murmur, unspecified: Secondary | ICD-10-CM | POA: Diagnosis not present

## 2017-11-27 NOTE — Progress Notes (Signed)
Radiation Oncology         [phone] ________________________________  Name: Amy Dawson        MRN: [id number]  Date of Service: 11/27/2017 DOB: 01-04-1966  PO:EUMPNTIR, Aaron Edelman, MD  [name], [name], MD     REFERRING PHYSICIAN: [name], [name], MD   DIAGNOSIS: The encounter diagnosis was Malignant neoplasm of upper-outer quadrant of left breast in female, estrogen receptor negative ([place]).   HISTORY OF PRESENT ILLNESS: Amy Dawson is a 52 y.o. female who was originally seen in the multidisciplinary breast clinic for a new diagnosis of left breast cancer. The patient was noted to have a palpable mass in the left breast and on diagnostic imaging which revealed a 2.9 x 2.5 x 2.2 cm at 12:30. Her axilla was negative for adenopathy. She underwent biopsy of this site on 05/14/17 which revealed a grade 2-3 triple negative invasive ductal carcinoma of the left breast with a Ki 67 of 80%. She underwent MRI on 05/29/18 which revealed her biopsy proven tumor and concern for a left axillary node measuring about 6 mm, repeat ultrasound was negative. She began neoadjuvant chemotherapy and also had genetic testing which was negative. Her chemotherapy completed on 10/22/17, and on post treatment MRI her tumor decreased in size to 2 x 1.8 x 1.6 cm, and no adenopathy was noted. She proceeded on  11/13/17 with lumpectomy and sentinel node biopsy. Final pathology revealed a grade 3, 3.2 cm invasive ductal carcinoma, the margins were negative and her 2 sampled nodes were also negative. She had repeat testing on her tumor which confirmed triple negative status. She is planning to proceed with Xeloda and comes today to discuss options of adjuvant radiotherapy.  PREVIOUS RADIATION THERAPY: No   PAST MEDICAL HISTORY:  Past Medical History:  Diagnosis Date  . Allergy   . Anxiety   . Breast cancer ([place])   . Chronic kidney disease    bladder issues  . Depression   . Family history of breast cancer   .  Fibromyalgia   . Genetic testing 06/13/2017   Breast/GYN panel (23 genes) @ [facility] - No pathogenic mutations detected  . Headache    smokes marijuana for migraine pain  . Murmur, cardiac    as a child  . Personal history of chemotherapy 2018  . Stroke ([place]) 2000   numbness on left side  . TMJ (dislocation of temporomandibular joint)        PAST SURGICAL HISTORY: Past Surgical History:  Procedure Laterality Date  . BREAST BIOPSY Left 05/14/2017   malignant  . BREAST LUMPECTOMY WITH AXILLARY LYMPH NODE BIOPSY Left 11/13/2017   Procedure: LEFT BREAST LUMPECTOMY WITH SENTINEL LYMPH NODE BIOPSY;  Surgeon: [name], MD;  Location: [facility];  Service: General;  Laterality: Left;  . CESAREAN SECTION     2  . ENDOMETRIAL ABLATION  2011  . PORTACATH PLACEMENT Right 05/31/2017   Procedure: INSERTION PORT-A-CATH WITH [place];  Surgeon: [name], MD;  Location: [facility];  Service: General;  Laterality: Right;     FAMILY HISTORY:  Family History  Problem Relation Age of Onset  . Liver cancer Sister        "rare type"; deceased 75  . Breast cancer Maternal Aunt        Dx 55s; currently 15s  . Breast cancer Paternal Grandmother        Dx 79s; deceased 5s  . Cancer Paternal Aunt  unk. type; deceased 83  . Colon cancer Neg Hx   . Esophageal cancer Neg Hx   . Rectal cancer Neg Hx   . Stomach cancer Neg Hx      SOCIAL HISTORY:  reports that she has quit smoking. She has never used smokeless tobacco. She reports that she drinks alcohol. She reports that she has current or past drug history. Drug: Marijuana. The patient is married and lives in [place]. 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   ALLERGIES: Other   MEDICATIONS:  Current Outpatient Medications  Medication Sig Dispense Refill  . amitriptyline (ELAVIL) 50 MG tablet Take 25 mg by mouth at bedtime as needed for sleep.    Marland Kitchen azelastine (OPTIVAR) 0.05 %  ophthalmic solution INSTILL 1 DROP INTO EACH EYE AS NEEDED  1  . Calcium Glycerophosphate (PRELIEF PO) Take 2 tablets by mouth daily.     . Cetirizine HCl (ZYRTEC ALLERGY PO) Take by mouth daily.    Marland Kitchen dimenhyDRINATE (DRAMAMINE) 50 MG tablet Take 50 mg every 8 (eight) hours as needed by mouth.    . fluticasone (FLONASE) 50 MCG/ACT nasal spray Place 2 sprays into both nostrils daily.    . Multiple Vitamin (MULTIVITAMIN) tablet Take 1 tablet by mouth daily.    . Pseudoephedrine HCl (SUDAFED PO) Take by mouth as needed.     No current facility-administered medications for this encounter.      REVIEW OF SYSTEMS: On review of systems, the patient reports that she is doing well overall since completion of chemotherapy and since her surgery. She reports she only took pain medication one day. She denies any chest pain, shortness of breath, cough, fevers, chills, night sweats, unintended weight changes. She denies any bowel or bladder disturbances, and denies abdominal pain, nausea or vomiting. She denies any new musculoskeletal or joint aches or pains. A complete review of systems is obtained and is otherwise negative.     PHYSICAL EXAM:  Wt Readings from Last 3 Encounters:  11/27/17 184 lb 3.2 oz (83.6 kg)  11/27/17 183 lb 12.8 oz (83.4 kg)  11/13/17 181 lb (82.1 kg)   Temp Readings from Last 3 Encounters:  11/27/17 98.4 F (36.9 C) (Oral)  11/27/17 98.4 F (36.9 C) (Oral)  11/13/17 98.6 F (37 C)   BP Readings from Last 3 Encounters:  11/27/17 137/85  11/27/17 135/84  11/13/17 127/86   Pulse Readings from Last 3 Encounters:  11/27/17 94  11/27/17 94  11/13/17 90     In general this is a well appearing caucasian female in no acute distress. She is alert and oriented x4 and appropriate throughout the examination. HEENT reveals that the patient is normocephalic, atraumatic. EOMs are intact. Cardiopulmonary assessment is negative for acute distress and she exhibits normal effort. Left  breast incisions are intact without erythema or edema of the chest wall or LUE.    ECOG = 0  0 - Asymptomatic (Fully active, able to carry on all predisease activities without restriction)  1 - Symptomatic but completely ambulatory (Restricted in physically strenuous activity but ambulatory and able to carry out work of a light or sedentary nature. For example, light housework, office work)  2 - Symptomatic, <50% in bed during the day (Ambulatory and capable of all self care but unable to carry out any work activities. Up and about more than 50% of waking hours)  3 - Symptomatic, >50% in bed, but not bedbound (Capable of only limited self-care, confined  to bed or chair 50% or more of waking hours)  4 - Bedbound (Completely disabled. Cannot carry on any self-care. Totally confined to bed or chair)  5 - Death   Eustace Pen MM, Creech RH, Tormey DC, et al. [phone]). "Toxicity and response criteria of the [facility]". [place] Oncol. 5 (6): 649-55    LABORATORY DATA:  Lab Results  Component Value Date   WBC 4.1 10/22/2017   HGB 11.6 10/22/2017   HCT 35.0 10/22/2017   MCV 105.4 (H) 10/22/2017   PLT 158 10/22/2017   Lab Results  Component Value Date   NA 139 10/22/2017   K 3.7 10/22/2017   CL 105 10/22/2017   CO2 26 10/22/2017   Lab Results  Component Value Date   ALT 23 10/22/2017   AST 20 10/22/2017   ALKPHOS 101 10/22/2017   BILITOT <0.2 (L) 10/22/2017      RADIOGRAPHY: Nm Sentinel Node Inj-no Rpt (breast)  Result Date: 11/13/2017 Sulfur colloid was injected by the nuclear medicine technologist for melanoma sentinel node.       IMPRESSION/PLAN: 1. Stage IIB, cT2N0M0 grade 2-3 triple negative invasive ductal carcinoma of the left breast. Dr. Lisbeth Renshaw discusses the rationale to consider external radiotherapy to the breast. She is healing and is only 2 weeks out from surgery. We discussed the risks, benefits, short, and long term effects of radiotherapy,  and the patient is interested in proceeding. Dr. Lisbeth Renshaw discusses the delivery and logistics of radiotherapy and recommends a course of 6 1/2 weeks with deep inspiration breath hold technique. Written consent is obtained and placed in the chart, a copy was provided to the patient. She will be contacted to coordinate simulation and we anticipate she will start treatment the first week of June 2019. She will also begin Xeloda with Dr. [name] given the disease present at the time of her surgery.  In a visit lasting 25 minutes, greater than 50% of the time was spent face to face discussing her case, and coordinating the patient's care.   The above documentation reflects my direct findings during this shared patient visit. Please see the separate note by Dr. Lisbeth Renshaw on this date for the remainder of the patient's plan of care.    Carola Rhine, PAC

## 2017-11-27 NOTE — Telephone Encounter (Signed)
Oral Oncology Pharmacist Encounter  Received new referral for Xeloda (capecitabine) for the adjuvant treatment of triple negative breast cancer in conjunction with radiation, planned duration 6 weeks of treatment. Patient is planned to continue on adjuvant Xeloda for an additional 4-6 months.  Xeloda dose and schedule will be different based on monitoring with concurrent radiation or adjuvant monotherapy.  Labs from 10/22/2017 assessed, okay for treatment.  Current medication list in Epic reviewed, no DDIs with Xeloda identified.  Prescription for Xeloda has not yet been written. Prescription for Xeloda will be sent to appropriate specialty pharmacy for dispensing once insurance authorization is obtained.  Oral Oncology Clinic will continue to follow for insurance authorization, copayment issues, initial counseling and start date.  Johny Drilling, PharmD, BCPS, BCOP  11/27/2017 2:44 PM Oral Oncology Clinic [phone]

## 2017-11-27 NOTE — Progress Notes (Addendum)
[facility]  Telephone:(336) [phone] Fax:(336) [phone]     ID: SAPHIA VANDERFORD DOB: [birth year]  MR#: [id number]  VCB#:[id number]  Patient Care Team: [name], MD as PCP - General (Family Medicine) [name], [name], MD as Consulting Physician (Oncology) [name], MD as Consulting Physician (General Surgery) [name], MD as Consulting Physician (Radiation Oncology) [name], NP as Nurse Practitioner (Obstetrics and Gynecology) OTHER MD:  CHIEF COMPLAINT: Triple negative breast cancer  CURRENT TREATMENT: To start Capecitabine and adjuvant Radiation  HISTORY OF CURRENT ILLNESS: From the original intake note:  [name] felt some discomfort in the left breast in June 2018, but did not think much of it.  However in October she palpated a change in the upper outer quadrant of her left breast and she brought this to medical attention.  On May 09, 2017 she had bilateral diagnostic mammography with tomography at the breast center, showing the breast density to be category capital C.  In the left breast that was not obscured mass in the upper outer quadrant, which by palpation was identified at the 1230 o'clock radiant 5 cm from the nipple.  By ultrasound this measured 2.9 cm, and it was irregular and hypoechoic.  The left axilla was sonographically benign.  Biopsy of the left breast mass in question May 14, 2017 showed [phone]) and invasive ductal carcinoma, grade 2 or more likely 3, estrogen and progesterone receptor negative, with no HER-2 amplification, the signals ratio being 1.18 and the number per cell 1.65.  The key 67 was 80%.  The patient's subsequent history is as detailed below.  INTERVAL HISTORY: Tashona returns today for follow-up and treatment of her triple negative breast cancer. She has completed neoadjuvant chemotherapy and has undergone surgery for her breast cancer.  On 11/13/2017 a 3.2 cm residual triple negative invasive ductal  carcinoma was removed.  She tolerated her surgery well.    REVIEW OF SYSTEMS: Ikeisha is doing well today.  She denies any pain or bleeding issues from her surgery.  She is without fevers or chills.  She continues to deny peripheral neuropathy.  She has a cough she thinks is related to allergies.  She denies fevers, chills, nausea, vomiting, constipation, or any other concerns today.  A detailed ROS was otherwise non contributory.    PAST MEDICAL HISTORY: Past Medical History:  Diagnosis Date  . Allergy   . Anxiety   . Breast cancer ([place])   . Chronic kidney disease    bladder issues  . Depression   . Family history of breast cancer   . Fibromyalgia   . Genetic testing 06/13/2017   Breast/GYN panel (23 genes) @ [facility] - No pathogenic mutations detected  . Headache    smokes marijuana for migraine pain  . Murmur, cardiac    as a child  . Personal history of chemotherapy 2018  . Stroke ([place]) 2000   numbness on left side  . TMJ (dislocation of temporomandibular joint)     PAST SURGICAL HISTORY: Past Surgical History:  Procedure Laterality Date  . BREAST BIOPSY Left 05/14/2017   malignant  . BREAST LUMPECTOMY WITH AXILLARY LYMPH NODE BIOPSY Left 11/13/2017   Procedure: LEFT BREAST LUMPECTOMY WITH SENTINEL LYMPH NODE BIOPSY;  Surgeon: [name], MD;  Location: [facility];  Service: General;  Laterality: Left;  . CESAREAN SECTION     2  . ENDOMETRIAL ABLATION  2011  . PORTACATH PLACEMENT Right 05/31/2017   Procedure: INSERTION PORT-A-CATH WITH [place];  Surgeon: [name], MD;  Location: [facility];  Service: General;  Laterality: Right;    FAMILY HISTORY Family History  Problem Relation Age of Onset  . Liver cancer Sister        "rare type"; deceased 71  . Breast cancer Maternal Aunt        Dx 53s; currently 64s  . Breast cancer Paternal Grandmother        Dx 73s; deceased 88s  . Cancer Paternal Aunt        unk. type; deceased 53    . Colon cancer Neg Hx   . Esophageal cancer Neg Hx   . Rectal cancer Neg Hx   . Stomach cancer Neg Hx   The patient's parents are in their mid 53s as of November 2018.  The patient had 1 brother, 1 sister.  The sister died from a rare liver cancer at age 53.  A maternal aunt and a paternal grandmother were diagnosed with breast cancer in their 53s.  GYNECOLOGIC HISTORY:  No LMP recorded. Patient has had an ablation. Menarche age 52.  She underwent endometrial ablation 2011.  She is no longer having periods.  She never used hormone replacement.  First live birth was age 52.  She is GX P2.   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  Her husband [name] works as an Firefighter for [employer]  Daughter Leonda Cristo attends [employer].  Son Rudie Rikard is doing on apprenticeship in the [employer] program.    ADVANCED DIRECTIVES: Not in place   HEALTH MAINTENANCE: Social History   Tobacco Use  . Smoking status: Former Research scientist (life sciences)  . Smokeless tobacco: Never Used  Substance Use Topics  . Alcohol use: Yes    Alcohol/week: 0.0 oz    Comment: sometimes  . Drug use: Yes    Types: Marijuana    Comment: last smoked 11-10-17 ("for anxiety"), uses daily     Colonoscopy: 2016/[facility]  PAP: October 2018  Bone density: Never   Allergies  Allergen Reactions  . Other Other (See Comments)    Red wine & white cheese: migraine Raw egg whites- hives    Current Outpatient Medications  Medication Sig Dispense Refill  . amitriptyline (ELAVIL) 50 MG tablet Take 25 mg by mouth at bedtime as needed for sleep.    Marland Kitchen azelastine (OPTIVAR) 0.05 % ophthalmic solution INSTILL 1 DROP INTO EACH EYE AS NEEDED  1  . Calcium Glycerophosphate (PRELIEF PO) Take 2 tablets by mouth daily.     . Cetirizine HCl (ZYRTEC ALLERGY PO) Take by mouth daily.    Marland Kitchen dimenhyDRINATE (DRAMAMINE) 50 MG tablet Take 50 mg every 8 (eight) hours as needed by mouth.    . fluticasone  (FLONASE) 50 MCG/ACT nasal spray Place 2 sprays into both nostrils daily.    . Multiple Vitamin (MULTIVITAMIN) tablet Take 1 tablet by mouth daily.    . Pseudoephedrine HCl (SUDAFED PO) Take by mouth as needed.     No current facility-administered medications for this visit.     OBJECTIVE: Vitals:   11/27/17 1100  BP: 135/84  Pulse: 94  Resp: 18  Temp: 98.4 F (36.9 C)  SpO2: 100%     Body mass index is 27.34 kg/m.   Wt Readings from Last 3 Encounters:  11/27/17 183 lb 12.8 oz (83.4 kg)  11/13/17 181 lb (82.1 kg)  10/15/17 178 lb 11.2 oz (81.1 kg)  ECOG FS:1 -  Symptomatic but completely ambulatory GENERAL: Patient is a well appearing female in no acute distress HEENT:  Sclerae anicteric.  Oropharynx clear and moist. No ulcerations or evidence of oropharyngeal candidiasis. Neck is supple.  NODES:  No cervical, supraclavicular, or axillary lymphadenopathy palpated.  BREAST EXAM:  Left breast s/p lumpectomy, site healing well, no erythema, swelling noted, left axillary surgical site healing well, no erythema or swelling noted. LUNGS:  Clear to auscultation bilaterally.  No wheezes or rhonchi. HEART:  Regular rate and rhythm. No murmur appreciated. ABDOMEN:  Soft, nontender.  Positive, normoactive bowel sounds. No organomegaly palpated. MSK:  No focal spinal tenderness to palpation. Full range of motion bilaterally in the upper extremities. EXTREMITIES:  No peripheral edema.   SKIN:  Clear with no obvious rashes or skin changes. No nail dyscrasia. NEURO:  Nonfocal. Well oriented.  Appropriate affect.    LAB RESULTS:  CMP     Component Value Date/Time   NA 139 10/22/2017 1135   NA 138 07/10/2017 1458   K 3.7 10/22/2017 1135   K 3.7 07/10/2017 1458   CL 105 10/22/2017 1135   CO2 26 10/22/2017 1135   CO2 25 07/10/2017 1458   GLUCOSE 120 10/22/2017 1135   GLUCOSE 95 07/10/2017 1458   BUN 7 10/22/2017 1135   BUN 7.4 07/10/2017 1458   CREATININE 0.75 10/22/2017 1135    CREATININE 0.7 07/10/2017 1458   CALCIUM 8.6 10/22/2017 1135   CALCIUM 8.9 07/10/2017 1458   PROT 6.3 (L) 10/22/2017 1135   PROT 7.1 07/10/2017 1458   ALBUMIN 3.2 (L) 10/22/2017 1135   ALBUMIN 3.8 07/10/2017 1458   AST 20 10/22/2017 1135   AST 10 07/10/2017 1458   ALT 23 10/22/2017 1135   ALT 11 07/10/2017 1458   ALKPHOS 101 10/22/2017 1135   ALKPHOS 92 07/10/2017 1458   BILITOT <0.2 (L) 10/22/2017 1135   BILITOT <0.22 07/10/2017 1458   GFRNONAA >60 10/22/2017 1135   GFRAA >60 10/22/2017 1135    No results found for: TOTALPROTELP, ALBUMINELP, A1GS, A2GS, BETS, BETA2SER, GAMS, MSPIKE, SPEI  No results found for: Nils Pyle, [facility]  Lab Results  Component Value Date   WBC 4.1 10/22/2017   NEUTROABS 2.5 10/22/2017   HGB 11.6 10/22/2017   HCT 35.0 10/22/2017   MCV 105.4 (H) 10/22/2017   PLT 158 10/22/2017      Chemistry      Component Value Date/Time   NA 139 10/22/2017 1135   NA 138 07/10/2017 1458   K 3.7 10/22/2017 1135   K 3.7 07/10/2017 1458   CL 105 10/22/2017 1135   CO2 26 10/22/2017 1135   CO2 25 07/10/2017 1458   BUN 7 10/22/2017 1135   BUN 7.4 07/10/2017 1458   CREATININE 0.75 10/22/2017 1135   CREATININE 0.7 07/10/2017 1458      Component Value Date/Time   CALCIUM 8.6 10/22/2017 1135   CALCIUM 8.9 07/10/2017 1458   ALKPHOS 101 10/22/2017 1135   ALKPHOS 92 07/10/2017 1458   AST 20 10/22/2017 1135   AST 10 07/10/2017 1458   ALT 23 10/22/2017 1135   ALT 11 07/10/2017 1458   BILITOT <0.2 (L) 10/22/2017 1135   BILITOT <0.22 07/10/2017 1458       No results found for: LABCA2  No components found for: XAJOIN867  No results for input(s): INR in the last 168 hours.  No results found for: LABCA2  No results found for: EHM094  No results found for: BSJ628  No results found  for: JTT017  No results found for: CA2729  No components found for: HGQUANT  No results found for: CEA1 / No results found for: CEA1   No results  found for: AFPTUMOR  No results found for: CHROMOGRNA  No results found for: PSA1  No visits with results within 3 Day(s) from this visit.  Latest known visit with results is:  Appointment on 10/22/2017  Component Date Value Ref Range Status  . WBC 10/22/2017 4.1  3.9 - 10.3 K/uL Final  . RBC 10/22/2017 3.32* 3.70 - 5.45 MIL/uL Final  . Hemoglobin 10/22/2017 11.6  11.6 - 15.9 g/dL Final  . HCT 10/22/2017 35.0  34.8 - 46.6 % Final  . MCV 10/22/2017 105.4* 79.5 - 101.0 fL Final  . MCH 10/22/2017 34.9* 25.1 - 34.0 pg Final  . MCHC 10/22/2017 33.1  31.5 - 36.0 g/dL Final  . RDW 10/22/2017 15.5* 11.2 - 14.5 % Final  . Platelets 10/22/2017 158  145 - 400 K/uL Final  . Neutrophils Relative % 10/22/2017 62  % Final  . Neutro Abs 10/22/2017 2.5  1.5 - 6.5 K/uL Final  . Lymphocytes Relative 10/22/2017 28  % Final  . Lymphs Abs 10/22/2017 1.2  0.9 - 3.3 K/uL Final  . Monocytes Relative 10/22/2017 9  % Final  . Monocytes Absolute 10/22/2017 0.4  0.1 - 0.9 K/uL Final  . Eosinophils Relative 10/22/2017 0  % Final  . Eosinophils Absolute 10/22/2017 0.0  0.0 - 0.5 K/uL Final  . Basophils Relative 10/22/2017 1  % Final  . Basophils Absolute 10/22/2017 0.0  0.0 - 0.1 K/uL Final   Performed at [facility], [place] [address], [place], [place] [address]  . Sodium 10/22/2017 139  136 - 145 mmol/L Final  . Potassium 10/22/2017 3.7  3.5 - 5.1 mmol/L Final  . Chloride 10/22/2017 105  98 - 109 mmol/L Final  . CO2 10/22/2017 26  22 - 29 mmol/L Final  . Glucose, Bld 10/22/2017 120  70 - 140 mg/dL Final  . BUN 10/22/2017 7  7 - 26 mg/dL Final  . Creatinine 10/22/2017 0.75  0.60 - 1.10 mg/dL Final  . Calcium 10/22/2017 8.6  8.4 - 10.4 mg/dL Final  . Total Protein 10/22/2017 6.3* 6.4 - 8.3 g/dL Final  . Albumin 10/22/2017 3.2* 3.5 - 5.0 g/dL Final  . AST 10/22/2017 20  5 - 34 U/L Final  . ALT 10/22/2017 23  0 - 55 U/L Final  . Alkaline Phosphatase 10/22/2017 101  40 - 150 U/L Final    . Total Bilirubin 10/22/2017 <0.2* 0.2 - 1.2 mg/dL Final  . GFR, Est Non Af Am 10/22/2017 >60  >60 mL/min Final  . GFR, Est AFR Am 10/22/2017 >60  >60 mL/min Final   Comment: (NOTE) The eGFR has been calculated using the CKD EPI equation. This calculation has not been validated in all clinical situations. eGFR's persistently <60 mL/min signify possible Chronic Kidney Disease.   Georgiann Hahn gap 10/22/2017 8  3 - 11 Final   Performed at [facility], [place] [address], [place], [place] [address]    (this displays the last labs from the last 3 days)  No results found for: TOTALPROTELP, ALBUMINELP, A1GS, A2GS, BETS, BETA2SER, GAMS, MSPIKE, SPEI (this displays SPEP labs)  No results found for: KPAFRELGTCHN, LAMBDASER, KAPLAMBRATIO (kappa/lambda light chains)  No results found for: HGBA, HGBA2QUANT, HGBFQUANT, HGBSQUAN (Hemoglobinopathy evaluation)   No results found for: LDH  No results found for:  IRON, TIBC, IRONPCTSAT (Iron and TIBC)  No results found for: FERRITIN  Urinalysis    Component Value Date/Time   COLORURINE YELLOW 07/07/2017 1400   APPEARANCEUR CLEAR 07/07/2017 1400   LABSPEC 1.003 (L) 07/07/2017 1400   LABSPEC 1.010 05/30/2017 1315   PHURINE 7.0 07/07/2017 1400   GLUCOSEU NEGATIVE 07/07/2017 1400   GLUCOSEU Negative 05/30/2017 1315   HGBUR NEGATIVE 07/07/2017 1400   BILIRUBINUR NEGATIVE 07/07/2017 1400   BILIRUBINUR Color Interference 05/30/2017 1315   KETONESUR NEGATIVE 07/07/2017 1400   PROTEINUR NEGATIVE 07/07/2017 1400   UROBILINOGEN Color Interference 05/30/2017 1315   NITRITE NEGATIVE 07/07/2017 1400   LEUKOCYTESUR NEGATIVE 07/07/2017 1400   LEUKOCYTESUR Color Interference 05/30/2017 1315     STUDIES: Nm Sentinel Node Inj-no Rpt (breast)  Result Date: 11/13/2017 Sulfur colloid was injected by the nuclear medicine technologist for melanoma sentinel node.     ELIGIBLE FOR AVAILABLE RESEARCH PROTOCOL: SWOG 6063 depending on  response to neoadjuvant treatment, UPBEAT  ASSESSMENT: 52 y.o. [place] status post left breast upper outer quadrant biopsy May 14, 2017 for a clinical T2 N0, stage IIB invasive ductal carcinoma, grade 3, triple negative, with an MIB-1 of 80%  (1) neoadjuvant chemotherapy consisting of doxorubicin and cyclophosphamide in dose dense fashion x4 started 06/04/2017, completed 07/17/2017; followed by weekly carboplatin and paclitaxel x12 started 07/30/2017, completed 10/22/2017  (a) echo on 05/30/2017: LVEF 55-60%  (2) Right lumpectomy and SLNB on 11/13/2017: showed ypT2 ypN0 residual invasive ductal carcinoma, grade 3, repeat prognostic panel again triple negative.  RCB-II  (3) adjuvant radiation to follow surgery with capecitabine sensitizaton  (a) capecitabine to be continued a total of 6 months  (4) genetics testing offered through [facility] 08/13/2016 showed: No deleterious mutations in ATM, BARD1, BRCA1, BRCA2, BRIP1, CDH1, CHEK2, DICER1, EPCAM*, MLH1, MSH2, MSH6, NBN, NF1, PALB2, PMS2, PTEN, RAD50, RAD51C, RAD51D, SMARCA4, STK11, TP53  (5) referral to SWOG K1601 in process  PLAN:  Julyssa is doing well today.  We reviewed her pathology results with her in detail.  She met with Dr. [name] and will meet with radiation oncology this afternoon.  She will start Capecitabine with radiation, and take 2 tablets twice a day on radiation days, then after radiation is complete, her Capecitabine will be increased to 3 tablets twice a day for 4-6 months.  Dr. [name] reviewed Capecitabine, dosing, side effects, risks, benefits with her in detail.  He also reviewed with her the [id number] trial following Capecitabine therapy.    Lorrie was able to meet with Denyse Amass in our oral pharmacy department to help assist [place] with getting the Capecitabine.  Lorrie will return when she starts her therapy.  We are awaiting her radiation to be scheduled.  She knows to call for any questions prior to her next appointment  with [place].     Wilber Bihari, NP 11/27/17 1:19 PM Medical Oncology and Hematology [facility] [address] [place], [place] [address] Tel. [phone]    Fax. [phone]   ADDENDUM: [name] did well with her surgery.  She did have some response to chemo but still has significant residual grade 3 triple negative tumor.  We are going to add capecitabine sensitization to her radiation treatments and then continue capecitabine full dose to complete a total of 6 months.  She understands this is the only treatment we have level 1 data for effectiveness in the setting of post neoadjuvant chemotherapy.  Once she completes the capecitabine she will be a good candidate for the smog  study randomizing patients like her to pembrolizumab versus observation.  She has a good understanding of these options and is very interested in proceeding with them.  She knows to call for any issues that may develop before the next visit.  I personally saw this patient and performed a substantive portion of this encounter with the listed APP documented above.   Chauncey Cruel, MD Medical Oncology and Hematology [facility] [address] [place], [place] [address] Tel. [phone]    Fax. [phone]

## 2017-11-27 NOTE — Telephone Encounter (Signed)
Oral Chemotherapy Pharmacist Encounter   I spoke with patient in exam room for overview of: Xeloda.   Counseled patient on administration, dosing, side effects, monitoring, drug-food interactions, safe handling, storage, and disposal.  Patient will take Xeloda 500mg  tablets, 2 tablets (1000 mg) by mouth in AM and 2 tabs (1000 mg) by mouth in PM, within 30 minutes of finishing meals, on days of radiation only.  Xeloda and radiation start date: TBD, radiation treatments not yet scheduled  At the completion of concurrent Xeloda and radiation course patient will continue on adjuvant Xeloda.  Once instructed by Dr. [name], patient will take Xeloda 500mg  tablets, 3 tablets (1500mg ) by mouth in AM and 3 tabs (1500mg ) by mouth in PM, within 30 minutes of finishing meals.  She will take her Xeloda for 14 days on, 7 days off, repeat every 21 days for an additional 4 to 6 months of therapy.   Adverse effects of Xeloda include but are not limited to: fatigue, decreased blood counts, GI upset, diarrhea, and hand-foot syndrome.  Patient has anti-emetic on hand and knows to take it if nausea develops.   Patient will obtain anti diarrheal and alert the office of 4 or more loose stools above baseline.   Reviewed with patient importance of keeping a medication schedule and plan for any missed doses.  Ms. Nagorski voiced understanding and appreciation.   All questions answered.  Medication reconciliation performed and medication/allergy list updated.  Patient informed that insurance will likely mandate her Xeloda be filled at [facility] specialty pharmacy. Patient informed that insurance authorization will be submitted by the office. Once insurance authorization is obtained prescription will be sent to appropriate specialty pharmacy for dispensing. Patient instructed that she will likely here [facility] dispensing pharmacy at some point in time early week of 12/02/2017.  Patient understands that she will wait to  start her Xeloda on the first day of her radiation treatments.  Patient knows to call the office with questions or concerns. Oral Oncology Clinic will continue to follow.  Thank you,  Johny Drilling, PharmD, BCPS, BCOP  11/27/2017  2:49 PM Oral Oncology Clinic [phone]

## 2017-11-27 NOTE — Progress Notes (Signed)
Location of Breast Cancer: Upper-outer quadrant of left breast in female  Histology per Pathology Report:   Diagnosis 06-14-17 Breast, left, needle core biopsy, UOQ, 12:30 o'clock, 5 cm fn - INVASIVE DUCTAL CARCINOMA.   Receptor Status: ER(0%%-), PR (0%-), Her2-neu (-), Ki-(80%)  Did patient present with symptoms (if so, please note symptoms) or was this found on screening mammography?: Noted to have a palpable mass in the left breast and on diagnostic imaging.    Past/Anticipated interventions by surgeon, if any:   Diagnosis 11-13-17 Dr. [name] 1. Breast, lumpectomy, Left - INVASIVE DUCTAL CARCINOMA, GRADE III, 3.2 CM. - TUMOR IS 0.2 CM FROM THE JUNCTION OF INFERIOR AND ANTERIOR MARGINS AND MORE THAN 0.5 CM FROM ALL OTHER MARGINS. - NO DEFINITE RESPONSE TO PRESURGICAL THERAPY. - BIOPSY SITE CHANGES. - BACKGROUND BREAST TISSUE WITH FIBROCYSTIC CHANGE, INCLUDING SCLEROSING ADENOSIS AND CALCIFICATIONS. - SEE ONCOLOGY TABLE. 2. Lymph node, sentinel, biopsy, Left axillary - LYMPH NODE, NEGATIVE FOR CARCINOMA (0/1). 3. Lymph node, sentinel, biopsy, Left axillary - LYMPH NODE, NEGATIVE FOR CARCINOMA (0/1).  Receptor Status: ER(0 % -), PR (0 % -), Her2-neu (-), Ki-(80%)   Past/Anticipated interventions by medical oncology, if any: Dr. [name] 11/27/2017 Discussed radiation and the study that she qualified for.  Neoadjuvant chemotherapy will consist of doxorubicin and cyclophosphamide in dose dense fashion x4 starting June 04, 2017, to be followed by weekly carboplatin and paclitaxel x12  adjuvant radiation to follow surgery   06-13-17 Genetics testing negative   Lymphedema issues, if any: No  Skin to left breast                    Follow up appointment with Dr. [name]  Pain issues, if any: No  SAFETY ISSUES:  Prior radiation?: No  Pacemaker/ICD? No  Possible current pregnancy?: No  Is the patient on methotrexate? :  No  Menarche  57, G2 P2 BC LMP endometrial ablation 2011 Menopause 2011  HRT yes 17 years   Current Complaints / other details: Liver cancer sister, Maternal grandmother and maternal aunt breast cancer

## 2017-11-28 ENCOUNTER — Encounter: Payer: Self-pay | Admitting: General Practice

## 2017-11-28 NOTE — Progress Notes (Signed)
[facility] Psychosocial Distress Screening Clinical Social Work  Clinical Social Work was referred by distress screening protocol.  The patient scored a 7 on the Psychosocial Distress Thermometer which indicates moderate distress. Clinical Social Worker Edwyna Shell to assess for distress and other psychosocial needs. Called patient, patient in midst of eating supper and did not want to talk.  Patient aware of [place] resources.  States she is "doing fine now."  Pls encourage patient to recontact CSW if desired in future.   ONCBCN DISTRESS SCREENING 11/27/2017  Screening Type Initial Screening  Distress experienced in past week (1-10) 7  Practical problem type   Emotional problem type Depression;Nervousness/Anxiety;Feeling hopeless;Adjusting to appearance changes  Spiritual/Religous concerns type   Physical Problem type     Clinical Social Worker follow up needed: No.  If yes, follow up plan:  Edwyna Shell, LCSW Clinical Social Worker Phone:  [phone]

## 2017-11-28 NOTE — Addendum Note (Signed)
Encounter addended by: [name], MD on: 11/28/2017 8:35 AM  Actions taken: Edit attestation on clinical note

## 2017-11-29 ENCOUNTER — Telehealth: Payer: Self-pay | Admitting: Pharmacist

## 2017-11-29 DIAGNOSIS — Z171 Estrogen receptor negative status [ER-]: Principal | ICD-10-CM

## 2017-11-29 DIAGNOSIS — C50412 Malignant neoplasm of upper-outer quadrant of left female breast: Secondary | ICD-10-CM

## 2017-11-29 MED ORDER — CAPECITABINE 500 MG PO TABS: 1000.0000 mg | ORAL_TABLET | Freq: Two times a day (BID) | ORAL | 0 refills | Status: DC

## 2017-11-29 MED ORDER — CAPECITABINE 500 MG PO TABS: 1000.0000 mg | ORAL_TABLET | Freq: Two times a day (BID) | ORAL | 2 refills | Status: DC

## 2017-11-29 NOTE — Telephone Encounter (Signed)
Oral Oncology Pharmacist Encounter  Insurance authorization for Xeloda has been approved Patient must fill her Xeloda through [facility] per insurance requirement.  Xeloda prescription printed by MD this morning.  New prescription for Xeloda 500mg  tablets, take 2 tablets (1000mg ) by mouth 2 times daily, within 30 minutes of finishing food. Take on radiation days on only, M-F  Patient planned for 6 weeks of radiation, 30 treatment days Sent quantity #120 for 6 week course  New prescription for Xeloda 500mg  tablets, take 3 tablets (1500mg ) by mouth 2 times daily, within 30 minutes of finishing food. Taken for 14 days on, 7 days off, repeated every 21 days, will need to be sent separately to pharmacy prior to start of monotherapy course.   To complete 6 total months of treatment.  Oral Oncology Clinic will continue to follow.  Johny Drilling, PharmD, BCPS, BCOP  11/29/2017   7:56 AM Oral Oncology Clinic [phone]

## 2017-12-02 ENCOUNTER — Telehealth: Payer: Self-pay | Admitting: *Deleted

## 2017-12-02 NOTE — Telephone Encounter (Signed)
Call received from [facility] in reference to medication order received .  Call transferred to Pharmacist requested patient's height and weight.  Nov 27, 2017 height and weight information provided with this call.  Helene Kelp denies any further questions or needs at this time.

## 2017-12-05 ENCOUNTER — Ambulatory Visit
Admission: RE | Admit: 2017-12-05 | Discharge: 2017-12-05 | Disposition: A | Discharge status: Home / Self Care | Payer: 59 | Source: Ambulatory Visit | Attending: Radiation Oncology | Admitting: Radiation Oncology

## 2017-12-05 DIAGNOSIS — Z510 Encounter for antineoplastic radiation therapy: Secondary | ICD-10-CM | POA: Diagnosis not present

## 2017-12-05 DIAGNOSIS — Z171 Estrogen receptor negative status [ER-]: Secondary | ICD-10-CM | POA: Insufficient documentation

## 2017-12-05 DIAGNOSIS — C50412 Malignant neoplasm of upper-outer quadrant of left female breast: Secondary | ICD-10-CM | POA: Diagnosis present

## 2017-12-05 NOTE — Progress Notes (Signed)
  Radiation Oncology         [phone] ________________________________  Name: Amy Dawson MRN: [id number]  Date: 12/05/2017  DOB: 09-Nov-1965  Optical Surface Tracking Plan:  Since intensity modulated radiotherapy (IMRT) and 3D conformal radiation treatment methods are predicated on accurate and precise positioning for treatment, intrafraction motion monitoring is medically necessary to ensure accurate and safe treatment delivery.  The ability to quantify intrafraction motion without excessive ionizing radiation dose can only be performed with optical surface tracking. Accordingly, surface imaging offers the opportunity to obtain 3D measurements of patient position throughout IMRT and 3D treatments without excessive radiation exposure.  I am ordering optical surface tracking for this patient's upcoming course of radiotherapy. ________________________________  [name], MD 12/05/2017 5:58 PM    Reference:   Ursula Alert, J, et al. Surface imaging-based analysis of intrafraction motion for breast radiotherapy patients.Journal of [place], n. 6, nov. 2014. ISSN [id number].   Available at: <http://www.jacmp.org/index.php/jacmp/article/view/4957>.

## 2017-12-05 NOTE — Progress Notes (Signed)
  Radiation Oncology         [phone] ________________________________  Name: Amy Dawson MRN: [id number]  Date: 12/05/2017  DOB: 03-May-1966  DIAGNOSIS:     ICD-10-CM   1. Malignant neoplasm of upper-outer quadrant of left breast in female, estrogen receptor negative ([place]) C50.412    Z17.1      SIMULATION AND TREATMENT PLANNING NOTE  The patient presented for simulation prior to beginning her course of radiation treatment for her diagnosis of left-sided breast cancer. The patient was placed in a supine position on a breast board. A customized vac-lock bag was constructed and this complex treatment device will be used on a daily basis during her treatment. In this fashion, a CT scan was obtained through the chest area and an isocenter was placed near the chest wall within the breast.  The patient will be planned to receive a course of radiation initially to a dose of 50.4 Gy. This will consist of a whole breast radiotherapy technique. To accomplish this, 2 customized blocks have been designed which will correspond to medial and lateral whole breast tangent fields. This treatment will be accomplished at 1.8 Gy per fraction. A forward planning technique will also be evaluated to determine if this approach improves the plan. It is anticipated that the patient will then receive a 10 Gy boost to the seroma cavity which has been contoured. This will be accomplished at 2 Gy per fraction.   This initial treatment will consist of a 3-D conformal technique. The seroma has been contoured as the primary target structure. Additionally, dose volume histograms of both this target as well as the lungs and heart will also be evaluated. Such an approach is necessary to ensure that the target area is adequately covered while the nearby critical  normal structures are adequately spared.  Plan:  The final anticipated total dose therefore will correspond to 60.4 Gy.   Special treatment procedure was  performed today due to the extra time and effort required by myself to plan and prepare this patient for deep inspiration breath hold technique.  I have determined cardiac sparing to be of benefit to this patient to prevent long term cardiac damage due to radiation of the heart.  Bellows were placed on the patient's abdomen. To facilitate cardiac sparing, the patient was coached by the radiation therapists on breath hold techniques and breathing practice was performed. Practice waveforms were obtained. The patient was then scanned while maintaining breath hold in the treatment position.  This image was then transferred over to the imaging specialist. The imaging specialist then created a fusion of the free breathing and breath hold scans using the chest wall as the stable structure. I personally reviewed the fusion in axial, coronal and sagittal image planes.  Excellent cardiac sparing was obtained.  I felt the patient is an appropriate candidate for breath hold and the patient will be treated as such.  The image fusion was then reviewed with the patient to reinforce the necessity of reproducible breath hold.     _______________________________   Jodelle Gross, MD, PhD

## 2017-12-05 NOTE — Telephone Encounter (Signed)
Oral Oncology Patient Advocate Encounter  Received confirmation from [facility] pharmacy that the patient will receive her initial shipment of Xeloda on 12/06/2017.   Fabio Asa. Melynda Keller, [name] [place] [phone] 12/05/2017 3:14 PM

## 2017-12-10 DIAGNOSIS — C50412 Malignant neoplasm of upper-outer quadrant of left female breast: Secondary | ICD-10-CM | POA: Diagnosis not present

## 2017-12-13 ENCOUNTER — Ambulatory Visit
Admission: RE | Admit: 2017-12-13 | Discharge: 2017-12-13 | Disposition: A | Discharge status: Home / Self Care | Payer: 59 | Source: Ambulatory Visit | Attending: Radiation Oncology | Admitting: Radiation Oncology

## 2017-12-13 DIAGNOSIS — C50412 Malignant neoplasm of upper-outer quadrant of left female breast: Secondary | ICD-10-CM | POA: Diagnosis not present

## 2017-12-16 ENCOUNTER — Ambulatory Visit
Admission: RE | Admit: 2017-12-16 | Discharge: 2017-12-16 | Disposition: A | Discharge status: Home / Self Care | Payer: 59 | Source: Ambulatory Visit | Attending: Radiation Oncology | Admitting: Radiation Oncology

## 2017-12-16 DIAGNOSIS — Z510 Encounter for antineoplastic radiation therapy: Secondary | ICD-10-CM | POA: Insufficient documentation

## 2017-12-16 DIAGNOSIS — Z171 Estrogen receptor negative status [ER-]: Secondary | ICD-10-CM | POA: Insufficient documentation

## 2017-12-16 DIAGNOSIS — C50412 Malignant neoplasm of upper-outer quadrant of left female breast: Secondary | ICD-10-CM | POA: Insufficient documentation

## 2017-12-17 ENCOUNTER — Ambulatory Visit
Admission: RE | Admit: 2017-12-17 | Discharge: 2017-12-17 | Disposition: A | Discharge status: Home / Self Care | Payer: 59 | Source: Ambulatory Visit | Attending: Radiation Oncology | Admitting: Radiation Oncology

## 2017-12-17 DIAGNOSIS — Z510 Encounter for antineoplastic radiation therapy: Secondary | ICD-10-CM | POA: Diagnosis not present

## 2017-12-18 ENCOUNTER — Ambulatory Visit
Admission: RE | Admit: 2017-12-18 | Discharge: 2017-12-18 | Disposition: A | Discharge status: Home / Self Care | Payer: 59 | Source: Ambulatory Visit | Attending: Radiation Oncology | Admitting: Radiation Oncology

## 2017-12-18 DIAGNOSIS — Z510 Encounter for antineoplastic radiation therapy: Secondary | ICD-10-CM | POA: Diagnosis not present

## 2017-12-19 ENCOUNTER — Ambulatory Visit
Admission: RE | Admit: 2017-12-19 | Discharge: 2017-12-19 | Disposition: A | Discharge status: Home / Self Care | Payer: 59 | Source: Ambulatory Visit | Attending: Radiation Oncology | Admitting: Radiation Oncology

## 2017-12-19 DIAGNOSIS — Z510 Encounter for antineoplastic radiation therapy: Secondary | ICD-10-CM | POA: Diagnosis not present

## 2017-12-20 ENCOUNTER — Inpatient Hospital Stay: Payer: 59

## 2017-12-20 ENCOUNTER — Encounter: Payer: Self-pay | Admitting: Adult Health

## 2017-12-20 ENCOUNTER — Ambulatory Visit
Admission: RE | Admit: 2017-12-20 | Discharge: 2017-12-20 | Disposition: A | Discharge status: Home / Self Care | Payer: 59 | Source: Ambulatory Visit | Attending: Radiation Oncology | Admitting: Radiation Oncology

## 2017-12-20 ENCOUNTER — Inpatient Hospital Stay: Payer: 59 | Attending: Oncology | Admitting: Adult Health

## 2017-12-20 VITALS — BP 128/86 | HR 81 | Temp 98.4°F | Resp 18 | Ht 68.75 in | Wt 178.3 lb

## 2017-12-20 DIAGNOSIS — Z171 Estrogen receptor negative status [ER-]: Principal | ICD-10-CM

## 2017-12-20 DIAGNOSIS — Z9221 Personal history of antineoplastic chemotherapy: Secondary | ICD-10-CM

## 2017-12-20 DIAGNOSIS — C50412 Malignant neoplasm of upper-outer quadrant of left female breast: Secondary | ICD-10-CM

## 2017-12-20 DIAGNOSIS — D709 Neutropenia, unspecified: Secondary | ICD-10-CM | POA: Diagnosis not present

## 2017-12-20 DIAGNOSIS — Z79899 Other long term (current) drug therapy: Secondary | ICD-10-CM | POA: Diagnosis not present

## 2017-12-20 DIAGNOSIS — Z800 Family history of malignant neoplasm of digestive organs: Secondary | ICD-10-CM | POA: Diagnosis not present

## 2017-12-20 DIAGNOSIS — Z510 Encounter for antineoplastic radiation therapy: Secondary | ICD-10-CM | POA: Diagnosis not present

## 2017-12-20 DIAGNOSIS — Z803 Family history of malignant neoplasm of breast: Secondary | ICD-10-CM | POA: Diagnosis not present

## 2017-12-20 DIAGNOSIS — Z87891 Personal history of nicotine dependence: Secondary | ICD-10-CM

## 2017-12-20 LAB — CBC WITH DIFFERENTIAL/PLATELET
BASOS ABS: 0 10*3/uL (ref 0.0–0.1)
BASOS PCT: 1 %
EOS PCT: 2 %
Eosinophils Absolute: 0.1 10*3/uL (ref 0.0–0.5)
HCT: 36.3 % (ref 34.8–46.6)
Hemoglobin: 12.3 g/dL (ref 11.6–15.9)
LYMPHS PCT: 33 %
Lymphs Abs: 0.9 10*3/uL (ref 0.9–3.3)
MCH: 33.5 pg (ref 25.1–34.0)
MCHC: 33.9 g/dL (ref 31.5–36.0)
MCV: 98.9 fL (ref 79.5–101.0)
MONO ABS: 0.4 10*3/uL (ref 0.1–0.9)
Monocytes Relative: 14 %
Neutro Abs: 1.3 10*3/uL — ABNORMAL LOW (ref 1.5–6.5)
Neutrophils Relative %: 50 %
PLATELETS: 224 10*3/uL (ref 145–400)
RBC: 3.67 MIL/uL — ABNORMAL LOW (ref 3.70–5.45)
RDW: 14.4 % (ref 11.2–14.5)
WBC: 2.6 10*3/uL — AB (ref 3.9–10.3)

## 2017-12-20 LAB — CMP (CANCER CENTER ONLY)
ALBUMIN: 4 g/dL (ref 3.5–5.0)
ALK PHOS: 75 U/L (ref 40–150)
ALT: 24 U/L (ref 0–55)
ANION GAP: 9 (ref 3–11)
AST: 35 U/L — AB (ref 5–34)
BILIRUBIN TOTAL: 0.4 mg/dL (ref 0.2–1.2)
BUN: 7 mg/dL (ref 7–26)
CO2: 27 mmol/L (ref 22–29)
Calcium: 9.1 mg/dL (ref 8.4–10.4)
Chloride: 101 mmol/L (ref 98–109)
Creatinine: 0.74 mg/dL (ref 0.60–1.10)
GFR, Est AFR Am: >60 mL/min (ref >60)
GFR, Estimated: >60 mL/min (ref >60)
GLUCOSE: 116 mg/dL (ref 70–140)
POTASSIUM: 3.9 mmol/L (ref 3.5–5.1)
Sodium: 137 mmol/L (ref 136–145)
TOTAL PROTEIN: 6.9 g/dL (ref 6.4–8.3)

## 2017-12-20 MED ORDER — RADIAPLEXRX EX GEL
Freq: Once | CUTANEOUS | Status: AC
  Administered 2017-12-20: 15:00:00 via TOPICAL

## 2017-12-20 MED ORDER — ALRA NON-METALLIC DEODORANT (RAD-ONC)
1.0000 "application " | Freq: Once | TOPICAL | Status: AC
  Administered 2017-12-20: 1 via TOPICAL

## 2017-12-20 NOTE — Progress Notes (Signed)
Amy Dawson  [phone] Fax:(336) [phone]     ID: Amy Dawson DOB: 1965-10-07  MR#: [id number]  FTD#:[id number]  Patient Care Team: [name], MD as PCP - General (Family Medicine) [name], [name], MD as Consulting Physician (Oncology) [name], MD as Consulting Physician (General Surgery) [name], MD as Consulting Physician (Radiation Oncology) [name], NP as Nurse Practitioner (Obstetrics and Gynecology) OTHER MD:  CHIEF COMPLAINT: Triple negative breast cancer  CURRENT TREATMENT: To start Capecitabine and adjuvant Radiation  HISTORY OF CURRENT ILLNESS: From the original intake note:  Amy Dawson felt some discomfort in the left breast in June 2018, but did not think much of it.  However in October she palpated a change in the upper outer quadrant of her left breast and she brought this to medical attention.  On May 09, 2017 she had bilateral diagnostic mammography with tomography at the breast center, showing the breast density to be category capital C.  In the left breast that was not obscured mass in the upper outer quadrant, which by palpation was identified at the 1230 o'clock radiant 5 cm from the nipple.  By ultrasound this measured 2.9 cm, and it was irregular and hypoechoic.  The left axilla was sonographically benign.  Biopsy of the left breast mass in question May 14, 2017 showed [phone]) and invasive ductal carcinoma, grade 2 or more likely 3, estrogen and progesterone receptor negative, with no HER-2 amplification, the signals ratio being 1.18 and the number per cell 1.65.  The key 67 was 80%.  The patient's subsequent history is as detailed below.  INTERVAL HISTORY: Amy Dawson returns today for follow-up and treatment of her triple negative breast cancer. She started radiation on 12/16/2017, on that day she also began Capecitabine sensitization BID.  She is taking two tablets BID with lunch and dinner.  She is  tolerating these moderately well.    REVIEW OF SYSTEMS: Amy Dawson is doing well with radiaiton.  She has noted after lunch when she goes walking, her hands will get swollen.  However, the swelling goes away shortly thereafter.  Amy Dawson is not having any diarrhea.  She is not experience any skin dryness or cracking in the palms of her hands or soles of her feet.  She denies any nausea, vomiting, rash, mucositis, fevers, chills, or any other concerns.  A detailed ROS was conducted and was non contributory.    PAST MEDICAL HISTORY: Past Medical History:  Diagnosis Date  . Allergy   . Anxiety   . Breast cancer ([place])   . Chronic kidney disease    bladder issues  . Depression   . Family history of breast cancer   . Fibromyalgia   . Genetic testing 06/13/2017   Breast/GYN panel (23 genes) @ [facility] - No pathogenic mutations detected  . Headache    smokes marijuana for migraine pain  . Murmur, cardiac    as a child  . Personal history of chemotherapy 2018  . Stroke ([place]) 2000   numbness on left side  . TMJ (dislocation of temporomandibular joint)     PAST SURGICAL HISTORY: Past Surgical History:  Procedure Laterality Date  . BREAST BIOPSY Left 05/14/2017   malignant  . BREAST LUMPECTOMY WITH AXILLARY LYMPH NODE BIOPSY Left 11/13/2017   Procedure: LEFT BREAST LUMPECTOMY WITH SENTINEL LYMPH NODE BIOPSY;  Surgeon: [name], MD;  Location: [facility];  Service: General;  Laterality: Left;  . CESAREAN SECTION     2  .  ENDOMETRIAL ABLATION  2011  . PORTACATH PLACEMENT Right 05/31/2017   Procedure: INSERTION PORT-A-CATH WITH [place];  Surgeon: [name], MD;  Location: [facility];  Service: General;  Laterality: Right;    FAMILY HISTORY Family History  Problem Relation Age of Onset  . Liver cancer Sister        "rare type"; deceased 55  . Breast cancer Maternal Aunt        Dx 23s; currently 58s  . Breast cancer Paternal Grandmother        Dx 53s;  deceased 38s  . Cancer Paternal Aunt        unk. type; deceased 65  . Colon cancer Neg Hx   . Esophageal cancer Neg Hx   . Rectal cancer Neg Hx   . Stomach cancer Neg Hx   The patient's parents are in their mid 38s as of November 2018.  The patient had 1 brother, 1 sister.  The sister died from a rare liver cancer at age 53.  A maternal aunt and a paternal grandmother were diagnosed with breast cancer in their 26s.  GYNECOLOGIC HISTORY:  No LMP recorded. Patient has had an ablation. Menarche age 68.  She underwent endometrial ablation 2011.  She is no longer having periods.  She never used hormone replacement.  First live birth was age 35.  She is GX P2.   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  Her husband [name] works as an Firefighter for [employer]  Daughter Demani Weyrauch attends [employer].  Son Shama Monfils is doing on apprenticeship in the [employer] program.    ADVANCED DIRECTIVES: Not in place   HEALTH MAINTENANCE: Social History   Tobacco Use  . Smoking status: Former Research scientist (life sciences)  . Smokeless tobacco: Never Used  Substance Use Topics  . Alcohol use: Yes    Alcohol/week: 0.0 oz    Comment: sometimes  . Drug use: Yes    Types: Marijuana    Comment: last smoked 11-10-17 ("for anxiety"), uses daily     Colonoscopy: 2016/[facility]  PAP: October 2018  Bone density: Never   Allergies  Allergen Reactions  . Other Other (See Comments)    Red wine & white cheese: migraine Raw egg whites- hives    Current Outpatient Medications  Medication Sig Dispense Refill  . amitriptyline (ELAVIL) 50 MG tablet Take 25 mg by mouth at bedtime as needed for sleep.    Marland Kitchen azelastine (OPTIVAR) 0.05 % ophthalmic solution INSTILL 1 DROP INTO EACH EYE AS NEEDED  1  . Calcium Glycerophosphate (PRELIEF PO) Take 2 tablets by mouth daily.     . capecitabine (XELODA) 500 MG tablet Take 2 tablets (1,000 mg total) by mouth 2 (two) times daily after  a meal. Take on days of radiation only, M-F 120 tablet 0  . Cetirizine HCl (ZYRTEC ALLERGY PO) Take by mouth daily.    Marland Kitchen dimenhyDRINATE (DRAMAMINE) 50 MG tablet Take 50 mg every 8 (eight) hours as needed by mouth.    . fluticasone (FLONASE) 50 MCG/ACT nasal spray Place 2 sprays into both nostrils daily.    . Multiple Vitamin (MULTIVITAMIN) tablet Take 1 tablet by mouth daily.    . Pseudoephedrine HCl (SUDAFED PO) Take by mouth as needed.     No current facility-administered medications for this visit.     OBJECTIVE: Vitals:   12/20/17 1315  BP: 128/86  Pulse: 81  Resp: 18  Temp: 98.4 F (  36.9 C)  SpO2: 100%     Body mass index is 26.52 kg/m.   Wt Readings from Last 3 Encounters:  12/20/17 178 lb 4.8 oz (80.9 kg)  11/27/17 184 lb 3.2 oz (83.6 kg)  11/27/17 183 lb 12.8 oz (83.4 kg)  ECOG FS:1 - Symptomatic but completely ambulatory GENERAL: Patient is a well appearing female in no acute distress HEENT:  Sclerae anicteric.  Oropharynx clear and moist. No ulcerations or evidence of oropharyngeal candidiasis. Neck is supple.  NODES:  No cervical, supraclavicular, or axillary lymphadenopathy palpated.  BREAST EXAM:  deferred LUNGS:  Clear to auscultation bilaterally.  No wheezes or rhonchi. HEART:  Regular rate and rhythm. No murmur appreciated. ABDOMEN:  Soft, nontender.  Positive, normoactive bowel sounds. No organomegaly palpated. MSK:  No focal spinal tenderness to palpation. Full range of motion bilaterally in the upper extremities. EXTREMITIES:  No peripheral edema.   SKIN:  Clear with no obvious rashes or skin changes. No nail dyscrasia. NEURO:  Nonfocal. Well oriented.  Appropriate affect.    LAB RESULTS:  CMP     Component Value Date/Time   NA 137 12/20/2017 1222   NA 138 07/10/2017 1458   K 3.9 12/20/2017 1222   K 3.7 07/10/2017 1458   CL 101 12/20/2017 1222   CO2 27 12/20/2017 1222   CO2 25 07/10/2017 1458   GLUCOSE 116 12/20/2017 1222   GLUCOSE 95 07/10/2017  1458   BUN 7 12/20/2017 1222   BUN 7.4 07/10/2017 1458   CREATININE 0.74 12/20/2017 1222   CREATININE 0.7 07/10/2017 1458   CALCIUM 9.1 12/20/2017 1222   CALCIUM 8.9 07/10/2017 1458   PROT 6.9 12/20/2017 1222   PROT 7.1 07/10/2017 1458   ALBUMIN 4.0 12/20/2017 1222   ALBUMIN 3.8 07/10/2017 1458   AST 35 (H) 12/20/2017 1222   AST 10 07/10/2017 1458   ALT 24 12/20/2017 1222   ALT 11 07/10/2017 1458   ALKPHOS 75 12/20/2017 1222   ALKPHOS 92 07/10/2017 1458   BILITOT 0.4 12/20/2017 1222   BILITOT <0.22 07/10/2017 1458   GFRNONAA >60 12/20/2017 1222   GFRAA >60 12/20/2017 1222    No results found for: TOTALPROTELP, ALBUMINELP, A1GS, A2GS, BETS, BETA2SER, GAMS, MSPIKE, SPEI  No results found for: Nils Pyle, [facility]  Lab Results  Component Value Date   WBC 2.6 (L) 12/20/2017   NEUTROABS 1.3 (L) 12/20/2017   HGB 12.3 12/20/2017   HCT 36.3 12/20/2017   MCV 98.9 12/20/2017   PLT 224 12/20/2017      Chemistry      Component Value Date/Time   NA 137 12/20/2017 1222   NA 138 07/10/2017 1458   K 3.9 12/20/2017 1222   K 3.7 07/10/2017 1458   CL 101 12/20/2017 1222   CO2 27 12/20/2017 1222   CO2 25 07/10/2017 1458   BUN 7 12/20/2017 1222   BUN 7.4 07/10/2017 1458   CREATININE 0.74 12/20/2017 1222   CREATININE 0.7 07/10/2017 1458      Component Value Date/Time   CALCIUM 9.1 12/20/2017 1222   CALCIUM 8.9 07/10/2017 1458   ALKPHOS 75 12/20/2017 1222   ALKPHOS 92 07/10/2017 1458   AST 35 (H) 12/20/2017 1222   AST 10 07/10/2017 1458   ALT 24 12/20/2017 1222   ALT 11 07/10/2017 1458   BILITOT 0.4 12/20/2017 1222   BILITOT <0.22 07/10/2017 1458       No results found for: LABCA2  No components found for: GBTDVV616  No results for  input(s): INR in the last 168 hours.  No results found for: LABCA2  No results found for: TGY563  No results found for: SLH734  No results found for: KAJ681  No results found for: CA2729  No components found for:  HGQUANT  No results found for: CEA1 / No results found for: CEA1   No results found for: AFPTUMOR  No results found for: CHROMOGRNA  No results found for: PSA1  Appointment on 12/20/2017  Component Date Value Ref Range Status  . Sodium 12/20/2017 137  136 - 145 mmol/L Final  . Potassium 12/20/2017 3.9  3.5 - 5.1 mmol/L Final  . Chloride 12/20/2017 101  98 - 109 mmol/L Final  . CO2 12/20/2017 27  22 - 29 mmol/L Final  . Glucose, Bld 12/20/2017 116  70 - 140 mg/dL Final  . BUN 12/20/2017 7  7 - 26 mg/dL Final  . Creatinine 12/20/2017 0.74  0.60 - 1.10 mg/dL Final  . Calcium 12/20/2017 9.1  8.4 - 10.4 mg/dL Final  . Total Protein 12/20/2017 6.9  6.4 - 8.3 g/dL Final  . Albumin 12/20/2017 4.0  3.5 - 5.0 g/dL Final  . AST 12/20/2017 35* 5 - 34 U/L Final  . ALT 12/20/2017 24  0 - 55 U/L Final  . Alkaline Phosphatase 12/20/2017 75  40 - 150 U/L Final  . Total Bilirubin 12/20/2017 0.4  0.2 - 1.2 mg/dL Final  . GFR, Est Non Af Am 12/20/2017 >60  >60 mL/min Final  . GFR, Est AFR Am 12/20/2017 >60  >60 mL/min Final   Comment: (NOTE) The eGFR has been calculated using the CKD EPI equation. This calculation has not been validated in all clinical situations. eGFR's persistently <60 mL/min signify possible Chronic Kidney Disease.   Georgiann Hahn gap 12/20/2017 9  3 - 11 Final   Performed at [facility], [place] [address], [place], [place] [address]  . WBC 12/20/2017 2.6* 3.9 - 10.3 K/uL Final  . RBC 12/20/2017 3.67* 3.70 - 5.45 MIL/uL Final  . Hemoglobin 12/20/2017 12.3  11.6 - 15.9 g/dL Final  . HCT 12/20/2017 36.3  34.8 - 46.6 % Final  . MCV 12/20/2017 98.9  79.5 - 101.0 fL Final  . MCH 12/20/2017 33.5  25.1 - 34.0 pg Final  . MCHC 12/20/2017 33.9  31.5 - 36.0 g/dL Final  . RDW 12/20/2017 14.4  11.2 - 14.5 % Final  . Platelets 12/20/2017 224  145 - 400 K/uL Final  . Neutrophils Relative % 12/20/2017 50  % Final  . Neutro Abs 12/20/2017 1.3* 1.5 - 6.5 K/uL Final  .  Lymphocytes Relative 12/20/2017 33  % Final  . Lymphs Abs 12/20/2017 0.9  0.9 - 3.3 K/uL Final  . Monocytes Relative 12/20/2017 14  % Final  . Monocytes Absolute 12/20/2017 0.4  0.1 - 0.9 K/uL Final  . Eosinophils Relative 12/20/2017 2  % Final  . Eosinophils Absolute 12/20/2017 0.1  0.0 - 0.5 K/uL Final  . Basophils Relative 12/20/2017 1  % Final  . Basophils Absolute 12/20/2017 0.0  0.0 - 0.1 K/uL Final   Performed at [facility], [place] [address], [place], [place] [address]    (this displays the last labs from the last 3 days)  No results found for: TOTALPROTELP, ALBUMINELP, A1GS, A2GS, BETS, BETA2SER, GAMS, MSPIKE, SPEI (this displays SPEP labs)  No results found for: KPAFRELGTCHN, LAMBDASER, KAPLAMBRATIO (kappa/lambda light chains)  No results found for: HGBA, HGBA2QUANT, HGBFQUANT, HGBSQUAN (Hemoglobinopathy  evaluation)   No results found for: LDH  No results found for: IRON, TIBC, IRONPCTSAT (Iron and TIBC)  No results found for: FERRITIN  Urinalysis    Component Value Date/Time   COLORURINE YELLOW 07/07/2017 1400   APPEARANCEUR CLEAR 07/07/2017 1400   LABSPEC 1.003 (L) 07/07/2017 1400   LABSPEC 1.010 05/30/2017 1315   PHURINE 7.0 07/07/2017 1400   GLUCOSEU NEGATIVE 07/07/2017 1400   GLUCOSEU Negative 05/30/2017 1315   HGBUR NEGATIVE 07/07/2017 1400   BILIRUBINUR NEGATIVE 07/07/2017 1400   BILIRUBINUR Color Interference 05/30/2017 1315   KETONESUR NEGATIVE 07/07/2017 1400   PROTEINUR NEGATIVE 07/07/2017 1400   UROBILINOGEN Color Interference 05/30/2017 1315   NITRITE NEGATIVE 07/07/2017 1400   LEUKOCYTESUR NEGATIVE 07/07/2017 1400   LEUKOCYTESUR Color Interference 05/30/2017 1315     STUDIES: No results found.   ELIGIBLE FOR AVAILABLE RESEARCH PROTOCOL: SWOG 5701 depending on response to neoadjuvant treatment, UPBEAT  ASSESSMENT: 52 y.o. [place] status post left breast upper outer quadrant biopsy May 14, 2017 for a clinical  T2 N0, stage IIB invasive ductal carcinoma, grade 3, triple negative, with an MIB-1 of 80%  (1) neoadjuvant chemotherapy consisting of doxorubicin and cyclophosphamide in dose dense fashion x4 started 06/04/2017, completed 07/17/2017; followed by weekly carboplatin and paclitaxel x12 started 07/30/2017, completed 10/22/2017  (a) echo on 05/30/2017: LVEF 55-60%  (2) Right lumpectomy and SLNB on 11/13/2017: showed ypT2 ypN0 residual invasive ductal carcinoma, grade 3, repeat prognostic panel again triple negative.  RCB-II  (3) adjuvant radiation to follow surgery with capecitabine sensitizaton starting on 12/16/2017  (a) capecitabine to be continued a total of 6 months  (4) genetics testing offered through [facility] 08/13/2016 showed: No deleterious mutations in ATM, BARD1, BRCA1, BRCA2, BRIP1, CDH1, CHEK2, DICER1, EPCAM*, MLH1, MSH2, MSH6, NBN, NF1, PALB2, PMS2, PTEN, RAD50, RAD51C, RAD51D, SMARCA4, STK11, TP53  (5) referral to SWOG X7939 in process  PLAN:  Mava is doing well today.  She is tolerating the Capecitabine well. She will continue this on her radiation days.  We will monitor her hands and their swelling.  We will see her back in one week for labs and f/u.  She knows to call for any questions prior to her next appointment with [place].    A total of (20) minutes of face-to-face time was spent with this patient with greater than 50% of that time in counseling and care-coordination.    Wilber Bihari, NP 12/20/17 1:59 PM Medical Oncology and Hematology [facility] [address] [place], [place] [address] Tel. [phone]    Fax. [phone]

## 2017-12-20 NOTE — Progress Notes (Signed)
Pt here for patient teaching.  Pt given Radiation and You booklet, skin care instructions, Alra deodorant and Radiaplex gel.  Reviewed areas of pertinence such as fatigue, hair loss, skin changes, breast tenderness and breast swelling . Pt able to give teach back of to pat skin and use unscented/gentle soap,apply Radiaplex bid, avoid applying anything to skin within 4 hours of treatment, avoid wearing an under wire bra and to use an electric razor if they must shave. Pt verbalizes understanding of information given and will contact nursing with any questions or concerns.     Cori Razor, RN

## 2017-12-23 ENCOUNTER — Ambulatory Visit
Admission: RE | Admit: 2017-12-23 | Discharge: 2017-12-23 | Disposition: A | Discharge status: Home / Self Care | Payer: 59 | Source: Ambulatory Visit | Attending: Radiation Oncology | Admitting: Radiation Oncology

## 2017-12-23 ENCOUNTER — Telehealth: Payer: Self-pay | Admitting: Adult Health

## 2017-12-23 DIAGNOSIS — Z510 Encounter for antineoplastic radiation therapy: Secondary | ICD-10-CM | POA: Diagnosis not present

## 2017-12-23 NOTE — Telephone Encounter (Signed)
Tried to call patient regarding 6/14

## 2017-12-24 ENCOUNTER — Ambulatory Visit: Payer: 59 | Admitting: Physical Therapy

## 2017-12-24 ENCOUNTER — Ambulatory Visit
Admission: RE | Admit: 2017-12-24 | Discharge: 2017-12-24 | Disposition: A | Discharge status: Home / Self Care | Payer: 59 | Source: Ambulatory Visit | Attending: Radiation Oncology | Admitting: Radiation Oncology

## 2017-12-24 DIAGNOSIS — Z510 Encounter for antineoplastic radiation therapy: Secondary | ICD-10-CM | POA: Diagnosis not present

## 2017-12-25 ENCOUNTER — Ambulatory Visit
Admission: RE | Admit: 2017-12-25 | Discharge: 2017-12-25 | Disposition: A | Discharge status: Home / Self Care | Payer: 59 | Source: Ambulatory Visit | Attending: Radiation Oncology | Admitting: Radiation Oncology

## 2017-12-25 DIAGNOSIS — Z510 Encounter for antineoplastic radiation therapy: Secondary | ICD-10-CM | POA: Diagnosis not present

## 2017-12-26 ENCOUNTER — Ambulatory Visit
Admission: RE | Admit: 2017-12-26 | Discharge: 2017-12-26 | Disposition: A | Discharge status: Home / Self Care | Payer: 59 | Source: Ambulatory Visit | Attending: Radiation Oncology | Admitting: Radiation Oncology

## 2017-12-26 ENCOUNTER — Encounter: Payer: Self-pay | Admitting: *Deleted

## 2017-12-26 DIAGNOSIS — Z510 Encounter for antineoplastic radiation therapy: Secondary | ICD-10-CM | POA: Diagnosis not present

## 2017-12-27 ENCOUNTER — Inpatient Hospital Stay: Payer: 59

## 2017-12-27 ENCOUNTER — Encounter: Payer: Self-pay | Admitting: Adult Health

## 2017-12-27 ENCOUNTER — Inpatient Hospital Stay (HOSPITAL_BASED_OUTPATIENT_CLINIC_OR_DEPARTMENT_OTHER): Payer: 59 | Admitting: Adult Health

## 2017-12-27 ENCOUNTER — Ambulatory Visit
Admission: RE | Admit: 2017-12-27 | Discharge: 2017-12-27 | Disposition: A | Discharge status: Home / Self Care | Payer: 59 | Source: Ambulatory Visit | Attending: Radiation Oncology | Admitting: Radiation Oncology

## 2017-12-27 VITALS — BP 118/80 | HR 90 | Temp 98.6°F | Resp 18 | Ht 68.75 in | Wt 176.3 lb

## 2017-12-27 DIAGNOSIS — Z87891 Personal history of nicotine dependence: Secondary | ICD-10-CM | POA: Diagnosis not present

## 2017-12-27 DIAGNOSIS — C50412 Malignant neoplasm of upper-outer quadrant of left female breast: Secondary | ICD-10-CM | POA: Diagnosis not present

## 2017-12-27 DIAGNOSIS — Z171 Estrogen receptor negative status [ER-]: Principal | ICD-10-CM

## 2017-12-27 DIAGNOSIS — D709 Neutropenia, unspecified: Secondary | ICD-10-CM | POA: Diagnosis not present

## 2017-12-27 DIAGNOSIS — Z9221 Personal history of antineoplastic chemotherapy: Secondary | ICD-10-CM | POA: Diagnosis not present

## 2017-12-27 DIAGNOSIS — Z800 Family history of malignant neoplasm of digestive organs: Secondary | ICD-10-CM

## 2017-12-27 DIAGNOSIS — Z510 Encounter for antineoplastic radiation therapy: Secondary | ICD-10-CM | POA: Diagnosis not present

## 2017-12-27 DIAGNOSIS — Z803 Family history of malignant neoplasm of breast: Secondary | ICD-10-CM | POA: Diagnosis not present

## 2017-12-27 DIAGNOSIS — Z79899 Other long term (current) drug therapy: Secondary | ICD-10-CM

## 2017-12-27 LAB — CBC WITH DIFFERENTIAL/PLATELET
BASOS ABS: 0 10*3/uL (ref 0.0–0.1)
BASOS PCT: 1 %
Eosinophils Absolute: 0.1 10*3/uL (ref 0.0–0.5)
Eosinophils Relative: 3 %
HEMATOCRIT: 39 % (ref 34.8–46.6)
HEMOGLOBIN: 13 g/dL (ref 11.6–15.9)
Lymphocytes Relative: 37 %
Lymphs Abs: 1.2 10*3/uL (ref 0.9–3.3)
MCH: 33.2 pg (ref 25.1–34.0)
MCHC: 33.3 g/dL (ref 31.5–36.0)
MCV: 99.7 fL (ref 79.5–101.0)
MONO ABS: 0.5 10*3/uL (ref 0.1–0.9)
Monocytes Relative: 16 %
NEUTROS ABS: 1.4 10*3/uL — AB (ref 1.5–6.5)
NEUTROS PCT: 43 %
Platelets: 228 10*3/uL (ref 145–400)
RBC: 3.91 MIL/uL (ref 3.70–5.45)
RDW: 13.9 % (ref 11.2–14.5)
WBC: 3.1 10*3/uL — ABNORMAL LOW (ref 3.9–10.3)

## 2017-12-27 LAB — CMP (CANCER CENTER ONLY)
ALBUMIN: 4.3 g/dL (ref 3.5–5.0)
ALT: 20 U/L (ref 0–55)
AST: 26 U/L (ref 5–34)
Alkaline Phosphatase: 79 U/L (ref 40–150)
Anion gap: 9 (ref 3–11)
BILIRUBIN TOTAL: 0.4 mg/dL (ref 0.2–1.2)
BUN: 6 mg/dL — ABNORMAL LOW (ref 7–26)
CO2: 28 mmol/L (ref 22–29)
Calcium: 9.9 mg/dL (ref 8.4–10.4)
Chloride: 102 mmol/L (ref 98–109)
Creatinine: 0.77 mg/dL (ref 0.60–1.10)
GFR, Est AFR Am: >60 mL/min (ref >60)
GFR, Estimated: >60 mL/min (ref >60)
GLUCOSE: 97 mg/dL (ref 70–140)
POTASSIUM: 4.4 mmol/L (ref 3.5–5.1)
SODIUM: 139 mmol/L (ref 136–145)
TOTAL PROTEIN: 7.3 g/dL (ref 6.4–8.3)

## 2017-12-27 NOTE — Progress Notes (Signed)
[place]  [phone] [phone]     ID: Amy Dawson DOB: May 30, 1966  MR#: [id number]  SHF#:[id number]  Patient Care Team: Amy Freeze, MD as PCP - General (Family Medicine) Dawson, Amy Dad, MD as Consulting Physician (Oncology) Amy Bookbinder, MD as Consulting Physician (General Surgery) Amy Rudd, MD as Consulting Physician (Radiation Oncology) Amy Gully, NP as Nurse Practitioner (Obstetrics and Gynecology) OTHER MD:  CHIEF COMPLAINT: Triple negative breast cancer  CURRENT TREATMENT: Capecitabine and adjuvant Radiation  HISTORY OF CURRENT ILLNESS: From the original intake note:  Amy Dawson felt some discomfort in the left breast in June 2018, but did not think much of it.  However in October she palpated a change in the upper outer quadrant of her left breast and she brought this to medical attention.  On May 09, 2017 she had bilateral diagnostic mammography with tomography at the breast center, showing the breast density to be category capital C.  In the left breast that was not obscured mass in the upper outer quadrant, which by palpation was identified at the 1230 o'clock radiant 5 cm from the nipple.  By ultrasound this measured 2.9 cm, and it was irregular and hypoechoic.  The left axilla was sonographically benign.  Biopsy of the left breast mass in question May 14, 2017 showed [phone]) and invasive ductal carcinoma, grade 2 or more likely 3, estrogen and progesterone receptor negative, with no HER-2 amplification, the signals ratio being 1.18 and the number per cell 1.65.  The key 67 was 80%.  The patient's subsequent history is as detailed below.  INTERVAL HISTORY: Amy Dawson returns today for follow-up and treatment of her triple negative breast cancer. She started radiation on 12/16/2017, on that day she also began Capecitabine sensitization BID.  She is taking two tablets BID with lunch and dinner.  She continues to  tolerate these moderately well.     REVIEW OF SYSTEMS: Amy Dawson is doing well today.  She continues to tolerate Capecitabine well.  She still notes some mild swelling in her hands when she goes walking.    She also notes some difficulty sleeping at night.  She can fall asleep, but does not stay asleep.  Amy Dawson continues to take Amitriptyline at night which is for sleep.  She denies any other issues today such as fevers, chills, skin changes in her hands and feet.  She notes loose bowel movements, but says she wouldn't consider them diarrhea.  She denies any other concerns today and a detailed ROS is otherwise non contributory.    PAST MEDICAL HISTORY: Past Medical History:  Diagnosis Date  . Allergy   . Anxiety   . Breast cancer ([place])   . Chronic kidney disease    bladder issues  . Depression   . Family history of breast cancer   . Fibromyalgia   . Genetic testing 06/13/2017   Breast/GYN panel (23 genes) @ [facility] - No pathogenic mutations detected  . Headache    smokes marijuana for migraine pain  . Murmur, cardiac    as a child  . Personal history of chemotherapy 2018  . Stroke ([place]) 2000   numbness on left side  . TMJ (dislocation of temporomandibular joint)     PAST SURGICAL HISTORY: Past Surgical History:  Procedure Laterality Date  . BREAST BIOPSY Left 05/14/2017   malignant  . BREAST LUMPECTOMY WITH AXILLARY LYMPH NODE BIOPSY Left 11/13/2017   Procedure: LEFT BREAST LUMPECTOMY WITH SENTINEL LYMPH NODE BIOPSY;  Surgeon: Amy Bookbinder,  MD;  Location: [facility];  Service: General;  Laterality: Left;  . CESAREAN SECTION     2  . ENDOMETRIAL ABLATION  2011  . PORTACATH PLACEMENT Right 05/31/2017   Procedure: INSERTION PORT-A-CATH WITH [place];  Surgeon: Amy Bookbinder, MD;  Location: [facility];  Service: General;  Laterality: Right;    FAMILY HISTORY Family History  Problem Relation Age of Onset  . Liver cancer Sister        "rare type";  deceased 82  . Breast cancer Maternal Aunt        Dx 64s; currently 1s  . Breast cancer Paternal Grandmother        Dx 28s; deceased 4s  . Cancer Paternal Aunt        unk. type; deceased 70  . Colon cancer Neg Hx   . Esophageal cancer Neg Hx   . Rectal cancer Neg Hx   . Stomach cancer Neg Hx   The patient's parents are in their mid 12s as of November 2018.  The patient had 1 brother, 1 sister.  The sister died from a rare liver cancer at age 33.  A maternal aunt and a paternal grandmother were diagnosed with breast cancer in their 74s.  GYNECOLOGIC HISTORY:  No LMP recorded. Patient has had an ablation. Menarche age 67.  She underwent endometrial ablation 2011.  She is no longer having periods.  She never used hormone replacement.  First live birth was age 29.  She is GX P2.   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  Her husband Amy Dawson works as an Firefighter for [employer]  Daughter Amy Dawson attends [employer].  Son Amy Dawson is doing on apprenticeship in the [employer] program.    ADVANCED DIRECTIVES: Not in place   HEALTH MAINTENANCE: Social History   Tobacco Use  . Smoking status: Former Research scientist (life sciences)  . Smokeless tobacco: Never Used  Substance Use Topics  . Alcohol use: Yes    Alcohol/week: 0.0 oz    Comment: sometimes  . Drug use: Yes    Types: Marijuana    Comment: last smoked 11-10-17 ("for anxiety"), uses daily     Colonoscopy: 2016/[facility]  PAP: October 2018  Bone density: Never   Allergies  Allergen Reactions  . Other Other (See Comments)    Red wine & white cheese: migraine Raw egg whites- hives    Current Outpatient Medications  Medication Sig Dispense Refill  . amitriptyline (ELAVIL) 50 MG tablet Take 25 mg by mouth at bedtime as needed for sleep.    Marland Kitchen azelastine (OPTIVAR) 0.05 % ophthalmic solution INSTILL 1 DROP INTO EACH EYE AS NEEDED  1  . Calcium Glycerophosphate (PRELIEF PO) Take 2  tablets by mouth daily.     . capecitabine (XELODA) 500 MG tablet Take 2 tablets (1,000 mg total) by mouth 2 (two) times daily after a meal. Take on days of radiation only, M-F 120 tablet 0  . Cetirizine HCl (ZYRTEC ALLERGY PO) Take by mouth daily.    Marland Kitchen dimenhyDRINATE (DRAMAMINE) 50 MG tablet Take 50 mg every 8 (eight) hours as needed by mouth.    . fluticasone (FLONASE) 50 MCG/ACT nasal spray Place 2 sprays into both nostrils daily.    . Multiple Vitamin (MULTIVITAMIN) tablet Take 1 tablet by mouth daily.    . Pseudoephedrine HCl (SUDAFED PO) Take by mouth as needed.     No current facility-administered medications for this  visit.     OBJECTIVE: Vitals:   12/27/17 1523  BP: 118/80  Pulse: 90  Resp: 18  Temp: 98.6 F (37 C)  SpO2: 100%     Body mass index is 26.22 kg/m.   Wt Readings from Last 3 Encounters:  12/27/17 176 lb 4.8 oz (80 kg)  12/20/17 178 lb 4.8 oz (80.9 kg)  11/27/17 184 lb 3.2 oz (83.6 kg)  ECOG FS:1 - Symptomatic but completely ambulatory GENERAL: Patient is a well appearing female in no acute distress HEENT:  Sclerae anicteric.  Oropharynx clear and moist. No ulcerations or evidence of oropharyngeal candidiasis. Neck is supple.  NODES:  No cervical, supraclavicular, or axillary lymphadenopathy palpated.  BREAST EXAM:  deferred LUNGS:  Clear to auscultation bilaterally.  No wheezes or rhonchi. HEART:  Regular rate and rhythm. No murmur appreciated. ABDOMEN:  Soft, nontender.  Positive, normoactive bowel sounds. No organomegaly palpated. MSK:  No focal spinal tenderness to palpation. Full range of motion bilaterally in the upper extremities. EXTREMITIES:  No peripheral edema.   SKIN:  Clear with no obvious rashes or skin changes. No nail dyscrasia. NEURO:  Nonfocal. Well oriented.  Appropriate affect.    LAB RESULTS:  CMP     Component Value Date/Time   NA 139 12/27/2017 1512   NA 138 07/10/2017 1458   K 4.4 12/27/2017 1512   K 3.7 07/10/2017 1458    CL 102 12/27/2017 1512   CO2 28 12/27/2017 1512   CO2 25 07/10/2017 1458   GLUCOSE 97 12/27/2017 1512   GLUCOSE 95 07/10/2017 1458   BUN 6 (L) 12/27/2017 1512   BUN 7.4 07/10/2017 1458   CREATININE 0.77 12/27/2017 1512   CREATININE 0.7 07/10/2017 1458   CALCIUM 9.9 12/27/2017 1512   CALCIUM 8.9 07/10/2017 1458   PROT 7.3 12/27/2017 1512   PROT 7.1 07/10/2017 1458   ALBUMIN 4.3 12/27/2017 1512   ALBUMIN 3.8 07/10/2017 1458   AST 26 12/27/2017 1512   AST 10 07/10/2017 1458   ALT 20 12/27/2017 1512   ALT 11 07/10/2017 1458   ALKPHOS 79 12/27/2017 1512   ALKPHOS 92 07/10/2017 1458   BILITOT 0.4 12/27/2017 1512   BILITOT <0.22 07/10/2017 1458   GFRNONAA >60 12/27/2017 1512   GFRAA >60 12/27/2017 1512    No results found for: TOTALPROTELP, ALBUMINELP, A1GS, A2GS, BETS, BETA2SER, GAMS, MSPIKE, SPEI  No results found for: Nils Pyle, [facility]  Lab Results  Component Value Date   WBC 3.1 (L) 12/27/2017   NEUTROABS 1.4 (L) 12/27/2017   HGB 13.0 12/27/2017   HCT 39.0 12/27/2017   MCV 99.7 12/27/2017   PLT 228 12/27/2017      Chemistry      Component Value Date/Time   NA 139 12/27/2017 1512   NA 138 07/10/2017 1458   K 4.4 12/27/2017 1512   K 3.7 07/10/2017 1458   CL 102 12/27/2017 1512   CO2 28 12/27/2017 1512   CO2 25 07/10/2017 1458   BUN 6 (L) 12/27/2017 1512   BUN 7.4 07/10/2017 1458   CREATININE 0.77 12/27/2017 1512   CREATININE 0.7 07/10/2017 1458      Component Value Date/Time   CALCIUM 9.9 12/27/2017 1512   CALCIUM 8.9 07/10/2017 1458   ALKPHOS 79 12/27/2017 1512   ALKPHOS 92 07/10/2017 1458   AST 26 12/27/2017 1512   AST 10 07/10/2017 1458   ALT 20 12/27/2017 1512   ALT 11 07/10/2017 1458   BILITOT 0.4 12/27/2017 1512   BILITOT <  0.22 07/10/2017 1458       No results found for: LABCA2  No components found for: [id number]  No results for input(s): INR in the last 168 hours.  No results found for: LABCA2  No results found for:  ALP379  No results found for: KWI097  No results found for: DZH299  No results found for: CA2729  No components found for: HGQUANT  No results found for: CEA1 / No results found for: CEA1   No results found for: AFPTUMOR  No results found for: CHROMOGRNA  No results found for: PSA1  Appointment on 12/27/2017  Component Date Value Ref Range Status  . WBC 12/27/2017 3.1* 3.9 - 10.3 K/uL Final  . RBC 12/27/2017 3.91  3.70 - 5.45 MIL/uL Final  . Hemoglobin 12/27/2017 13.0  11.6 - 15.9 g/dL Final  . HCT 12/27/2017 39.0  34.8 - 46.6 % Final  . MCV 12/27/2017 99.7  79.5 - 101.0 fL Final  . MCH 12/27/2017 33.2  25.1 - 34.0 pg Final  . MCHC 12/27/2017 33.3  31.5 - 36.0 g/dL Final  . RDW 12/27/2017 13.9  11.2 - 14.5 % Final  . Platelets 12/27/2017 228  145 - 400 K/uL Final  . Neutrophils Relative % 12/27/2017 43  % Final  . Neutro Abs 12/27/2017 1.4* 1.5 - 6.5 K/uL Final  . Lymphocytes Relative 12/27/2017 37  % Final  . Lymphs Abs 12/27/2017 1.2  0.9 - 3.3 K/uL Final  . Monocytes Relative 12/27/2017 16  % Final  . Monocytes Absolute 12/27/2017 0.5  0.1 - 0.9 K/uL Final  . Eosinophils Relative 12/27/2017 3  % Final  . Eosinophils Absolute 12/27/2017 0.1  0.0 - 0.5 K/uL Final  . Basophils Relative 12/27/2017 1  % Final  . Basophils Absolute 12/27/2017 0.0  0.0 - 0.1 K/uL Final   Performed at [facility], [place] [address], [place], [place] [address]  . Sodium 12/27/2017 139  136 - 145 mmol/L Final  . Potassium 12/27/2017 4.4  3.5 - 5.1 mmol/L Final  . Chloride 12/27/2017 102  98 - 109 mmol/L Final  . CO2 12/27/2017 28  22 - 29 mmol/L Final  . Glucose, Bld 12/27/2017 97  70 - 140 mg/dL Final  . BUN 12/27/2017 6* 7 - 26 mg/dL Final  . Creatinine 12/27/2017 0.77  0.60 - 1.10 mg/dL Final  . Calcium 12/27/2017 9.9  8.4 - 10.4 mg/dL Final  . Total Protein 12/27/2017 7.3  6.4 - 8.3 g/dL Final  . Albumin 12/27/2017 4.3  3.5 - 5.0 g/dL Final  . AST 12/27/2017 26  5  - 34 U/L Final  . ALT 12/27/2017 20  0 - 55 U/L Final  . Alkaline Phosphatase 12/27/2017 79  40 - 150 U/L Final  . Total Bilirubin 12/27/2017 0.4  0.2 - 1.2 mg/dL Final  . GFR, Est Non Af Am 12/27/2017 >60  >60 mL/min Final  . GFR, Est AFR Am 12/27/2017 >60  >60 mL/min Final   Comment: (NOTE) The eGFR has been calculated using the CKD EPI equation. This calculation has not been validated in all clinical situations. eGFR's persistently <60 mL/min signify possible Chronic Kidney Disease.   Georgiann Hahn gap 12/27/2017 9  3 - 11 Final   Performed at [facility], [place] [address], [place], [place] [address]    (this displays the last labs from the last 3 days)  No results found for: TOTALPROTELP, ALBUMINELP, A1GS, A2GS, BETS, BETA2SER, GAMS, MSPIKE, SPEI (  this displays SPEP labs)  No results found for: KPAFRELGTCHN, LAMBDASER, KAPLAMBRATIO (kappa/lambda light chains)  No results found for: HGBA, HGBA2QUANT, HGBFQUANT, HGBSQUAN (Hemoglobinopathy evaluation)   No results found for: LDH  No results found for: IRON, TIBC, IRONPCTSAT (Iron and TIBC)  No results found for: FERRITIN  Urinalysis    Component Value Date/Time   COLORURINE YELLOW 07/07/2017 1400   APPEARANCEUR CLEAR 07/07/2017 1400   LABSPEC 1.003 (L) 07/07/2017 1400   LABSPEC 1.010 05/30/2017 1315   PHURINE 7.0 07/07/2017 1400   GLUCOSEU NEGATIVE 07/07/2017 1400   GLUCOSEU Negative 05/30/2017 1315   HGBUR NEGATIVE 07/07/2017 1400   BILIRUBINUR NEGATIVE 07/07/2017 1400   BILIRUBINUR Color Interference 05/30/2017 1315   KETONESUR NEGATIVE 07/07/2017 1400   PROTEINUR NEGATIVE 07/07/2017 1400   UROBILINOGEN Color Interference 05/30/2017 1315   NITRITE NEGATIVE 07/07/2017 1400   LEUKOCYTESUR NEGATIVE 07/07/2017 1400   LEUKOCYTESUR Color Interference 05/30/2017 1315     STUDIES: No results found.   ELIGIBLE FOR AVAILABLE RESEARCH PROTOCOL: SWOG [id number] depending on response to neoadjuvant  treatment, UPBEAT  ASSESSMENT: 52 y.o. [place] status post left breast upper outer quadrant biopsy May 14, 2017 for a clinical T2 N0, stage IIB invasive ductal carcinoma, grade 3, triple negative, with an MIB-1 of 80%  (1) neoadjuvant chemotherapy consisting of doxorubicin and cyclophosphamide in dose dense fashion x4 started 06/04/2017, completed 07/17/2017; followed by weekly carboplatin and paclitaxel x12 started 07/30/2017, completed 10/22/2017  (a) echo on 05/30/2017: LVEF 55-60%  (2) Right lumpectomy and SLNB on 11/13/2017: showed ypT2 ypN0 residual invasive ductal carcinoma, grade 3, repeat prognostic panel again triple negative.  RCB-II  (3) adjuvant radiation to follow surgery with capecitabine sensitizaton starting on 12/16/2017  (a) capecitabine to be continued a total of 6 months  (4) genetics testing offered through [facility] 08/13/2016 showed: No deleterious mutations in ATM, BARD1, BRCA1, BRCA2, BRIP1, CDH1, CHEK2, DICER1, EPCAM*, MLH1, MSH2, MSH6, NBN, NF1, PALB2, PMS2, PTEN, RAD50, RAD51C, RAD51D, SMARCA4, STK11, TP53  (5) referral to SWOG T6546 in process  PLAN:  Amy Dawson is doing well today.  Her labs remain stable and she does have a mild neutropenia with an ANC of 1.4.  This is improved from last week.  Amy Dawson will continue taking Capecitabine BID on radiation days and continue with daily radiation.  I will f/u with Amy Dawson about the sleep question and how it may relate to the capecitabine.  Amy Dawson will return in 2 weeks for labs and f/u with [place].    She knows to call for any questions prior to her next appointment with [place].    A total of (20) minutes of face-to-face time was spent with this patient with greater than 50% of that time in counseling and care-coordination.    Wilber Bihari, NP 12/27/17 3:53 PM Medical Oncology and Hematology [facility] [address] [place], [place] [address] Tel. [phone]    Fax. [phone]

## 2017-12-30 ENCOUNTER — Ambulatory Visit
Admission: RE | Admit: 2017-12-30 | Discharge: 2017-12-30 | Disposition: A | Discharge status: Home / Self Care | Payer: 59 | Source: Ambulatory Visit | Attending: Radiation Oncology | Admitting: Radiation Oncology

## 2017-12-30 ENCOUNTER — Telehealth: Payer: Self-pay | Admitting: Oncology

## 2017-12-30 DIAGNOSIS — Z510 Encounter for antineoplastic radiation therapy: Secondary | ICD-10-CM | POA: Diagnosis not present

## 2017-12-30 NOTE — Telephone Encounter (Signed)
Scheduled appts per 6/14 los - left voicemail for pt re appts that were added.

## 2017-12-31 ENCOUNTER — Ambulatory Visit
Admission: RE | Admit: 2017-12-31 | Discharge: 2017-12-31 | Disposition: A | Discharge status: Home / Self Care | Payer: 59 | Source: Ambulatory Visit | Attending: Radiation Oncology | Admitting: Radiation Oncology

## 2017-12-31 ENCOUNTER — Ambulatory Visit: Payer: 59 | Attending: General Surgery | Admitting: Rehabilitation

## 2017-12-31 ENCOUNTER — Ambulatory Visit: Payer: 59 | Admitting: Adult Health

## 2017-12-31 DIAGNOSIS — Z510 Encounter for antineoplastic radiation therapy: Secondary | ICD-10-CM | POA: Diagnosis not present

## 2018-01-01 ENCOUNTER — Ambulatory Visit
Admission: RE | Admit: 2018-01-01 | Discharge: 2018-01-01 | Disposition: A | Discharge status: Home / Self Care | Payer: 59 | Source: Ambulatory Visit | Attending: Radiation Oncology | Admitting: Radiation Oncology

## 2018-01-01 DIAGNOSIS — Z510 Encounter for antineoplastic radiation therapy: Secondary | ICD-10-CM | POA: Diagnosis not present

## 2018-01-02 ENCOUNTER — Ambulatory Visit
Admission: RE | Admit: 2018-01-02 | Discharge: 2018-01-02 | Disposition: A | Discharge status: Home / Self Care | Payer: 59 | Source: Ambulatory Visit | Attending: Radiation Oncology | Admitting: Radiation Oncology

## 2018-01-02 ENCOUNTER — Other Ambulatory Visit: Payer: Self-pay | Admitting: Oncology

## 2018-01-02 DIAGNOSIS — Z171 Estrogen receptor negative status [ER-]: Principal | ICD-10-CM

## 2018-01-02 DIAGNOSIS — Z510 Encounter for antineoplastic radiation therapy: Secondary | ICD-10-CM | POA: Diagnosis not present

## 2018-01-02 DIAGNOSIS — C50412 Malignant neoplasm of upper-outer quadrant of left female breast: Secondary | ICD-10-CM

## 2018-01-03 ENCOUNTER — Ambulatory Visit
Admission: RE | Admit: 2018-01-03 | Discharge: 2018-01-03 | Disposition: A | Discharge status: Home / Self Care | Payer: 59 | Source: Ambulatory Visit | Attending: Radiation Oncology | Admitting: Radiation Oncology

## 2018-01-03 DIAGNOSIS — Z510 Encounter for antineoplastic radiation therapy: Secondary | ICD-10-CM | POA: Diagnosis not present

## 2018-01-06 ENCOUNTER — Ambulatory Visit
Admission: RE | Admit: 2018-01-06 | Discharge: 2018-01-06 | Disposition: A | Discharge status: Home / Self Care | Payer: 59 | Source: Ambulatory Visit | Attending: Radiation Oncology | Admitting: Radiation Oncology

## 2018-01-06 DIAGNOSIS — Z510 Encounter for antineoplastic radiation therapy: Secondary | ICD-10-CM | POA: Diagnosis not present

## 2018-01-07 ENCOUNTER — Ambulatory Visit
Admission: RE | Admit: 2018-01-07 | Discharge: 2018-01-07 | Disposition: A | Discharge status: Home / Self Care | Payer: 59 | Source: Ambulatory Visit | Attending: Radiation Oncology | Admitting: Radiation Oncology

## 2018-01-07 DIAGNOSIS — Z510 Encounter for antineoplastic radiation therapy: Secondary | ICD-10-CM | POA: Diagnosis not present

## 2018-01-08 ENCOUNTER — Ambulatory Visit
Admission: RE | Admit: 2018-01-08 | Discharge: 2018-01-08 | Disposition: A | Discharge status: Home / Self Care | Payer: 59 | Source: Ambulatory Visit | Attending: Radiation Oncology | Admitting: Radiation Oncology

## 2018-01-08 DIAGNOSIS — Z510 Encounter for antineoplastic radiation therapy: Secondary | ICD-10-CM | POA: Diagnosis not present

## 2018-01-09 ENCOUNTER — Ambulatory Visit
Admission: RE | Admit: 2018-01-09 | Discharge: 2018-01-09 | Disposition: A | Discharge status: Home / Self Care | Payer: 59 | Source: Ambulatory Visit | Attending: Radiation Oncology | Admitting: Radiation Oncology

## 2018-01-09 DIAGNOSIS — Z510 Encounter for antineoplastic radiation therapy: Secondary | ICD-10-CM | POA: Diagnosis not present

## 2018-01-10 ENCOUNTER — Ambulatory Visit
Admission: RE | Admit: 2018-01-10 | Discharge: 2018-01-10 | Disposition: A | Discharge status: Home / Self Care | Payer: 59 | Source: Ambulatory Visit | Attending: Radiation Oncology | Admitting: Radiation Oncology

## 2018-01-10 DIAGNOSIS — Z510 Encounter for antineoplastic radiation therapy: Secondary | ICD-10-CM | POA: Diagnosis not present

## 2018-01-13 ENCOUNTER — Ambulatory Visit
Admission: RE | Admit: 2018-01-13 | Discharge: 2018-01-13 | Disposition: A | Discharge status: Home / Self Care | Payer: 59 | Source: Ambulatory Visit | Attending: Radiation Oncology | Admitting: Radiation Oncology

## 2018-01-13 ENCOUNTER — Inpatient Hospital Stay: Payer: 59 | Admitting: Adult Health

## 2018-01-13 ENCOUNTER — Inpatient Hospital Stay: Payer: 59 | Attending: Oncology

## 2018-01-13 DIAGNOSIS — Z8673 Personal history of transient ischemic attack (TIA), and cerebral infarction without residual deficits: Secondary | ICD-10-CM | POA: Diagnosis not present

## 2018-01-13 DIAGNOSIS — Z808 Family history of malignant neoplasm of other organs or systems: Secondary | ICD-10-CM | POA: Diagnosis not present

## 2018-01-13 DIAGNOSIS — Z510 Encounter for antineoplastic radiation therapy: Secondary | ICD-10-CM | POA: Insufficient documentation

## 2018-01-13 DIAGNOSIS — F419 Anxiety disorder, unspecified: Secondary | ICD-10-CM | POA: Diagnosis not present

## 2018-01-13 DIAGNOSIS — Z171 Estrogen receptor negative status [ER-]: Secondary | ICD-10-CM | POA: Diagnosis present

## 2018-01-13 DIAGNOSIS — Z79899 Other long term (current) drug therapy: Secondary | ICD-10-CM | POA: Diagnosis not present

## 2018-01-13 DIAGNOSIS — Z9221 Personal history of antineoplastic chemotherapy: Secondary | ICD-10-CM | POA: Diagnosis not present

## 2018-01-13 DIAGNOSIS — Z87891 Personal history of nicotine dependence: Secondary | ICD-10-CM | POA: Insufficient documentation

## 2018-01-13 DIAGNOSIS — M797 Fibromyalgia: Secondary | ICD-10-CM | POA: Insufficient documentation

## 2018-01-13 DIAGNOSIS — F329 Major depressive disorder, single episode, unspecified: Secondary | ICD-10-CM | POA: Insufficient documentation

## 2018-01-13 DIAGNOSIS — Z803 Family history of malignant neoplasm of breast: Secondary | ICD-10-CM | POA: Diagnosis not present

## 2018-01-13 DIAGNOSIS — N189 Chronic kidney disease, unspecified: Secondary | ICD-10-CM | POA: Diagnosis not present

## 2018-01-13 DIAGNOSIS — Z800 Family history of malignant neoplasm of digestive organs: Secondary | ICD-10-CM | POA: Insufficient documentation

## 2018-01-13 DIAGNOSIS — C50412 Malignant neoplasm of upper-outer quadrant of left female breast: Secondary | ICD-10-CM | POA: Insufficient documentation

## 2018-01-14 ENCOUNTER — Ambulatory Visit
Admission: RE | Admit: 2018-01-14 | Discharge: 2018-01-14 | Disposition: A | Discharge status: Home / Self Care | Payer: 59 | Source: Ambulatory Visit | Attending: Radiation Oncology | Admitting: Radiation Oncology

## 2018-01-14 DIAGNOSIS — Z510 Encounter for antineoplastic radiation therapy: Secondary | ICD-10-CM | POA: Diagnosis not present

## 2018-01-15 ENCOUNTER — Telehealth: Payer: Self-pay | Admitting: Oncology

## 2018-01-15 ENCOUNTER — Ambulatory Visit
Admission: RE | Admit: 2018-01-15 | Discharge: 2018-01-15 | Disposition: A | Discharge status: Home / Self Care | Payer: 59 | Source: Ambulatory Visit | Attending: Radiation Oncology | Admitting: Radiation Oncology

## 2018-01-15 DIAGNOSIS — Z510 Encounter for antineoplastic radiation therapy: Secondary | ICD-10-CM | POA: Diagnosis not present

## 2018-01-15 NOTE — Telephone Encounter (Signed)
Returned patients call to r/s missed appts.

## 2018-01-17 ENCOUNTER — Ambulatory Visit
Admission: RE | Admit: 2018-01-17 | Discharge: 2018-01-17 | Disposition: A | Discharge status: Home / Self Care | Payer: 59 | Source: Ambulatory Visit | Attending: Radiation Oncology | Admitting: Radiation Oncology

## 2018-01-17 DIAGNOSIS — Z510 Encounter for antineoplastic radiation therapy: Secondary | ICD-10-CM | POA: Diagnosis not present

## 2018-01-20 ENCOUNTER — Ambulatory Visit: Admission: RE | Admit: 2018-01-20 | Payer: 59 | Source: Ambulatory Visit

## 2018-01-20 NOTE — Progress Notes (Signed)
[place]  Telephone:([phone] Fax:(336) [phone]     ID: Amy Dawson DOB: 04/30/1966  MR#: [id number]  GYF#:[id number]  Patient Care Team: [name], MD as PCP - General (Family Medicine) Jaiyla Granados, [name], MD as Consulting Physician (Oncology) [name], MD as Consulting Physician (General Surgery) [name], MD as Consulting Physician (Radiation Oncology) [name], NP as Nurse Practitioner (Obstetrics and Gynecology) OTHER MD:  CHIEF COMPLAINT: Triple negative breast cancer  CURRENT TREATMENT: [Capecitabine], adjuvant Radiation  HISTORY OF CURRENT ILLNESS: From the original intake note:  Amy Dawson felt some discomfort in the left breast in June 2018, but did not think much of it.  However in October she palpated a change in the upper outer quadrant of her left breast and she brought this to medical attention.  On May 09, 2017 she had bilateral diagnostic mammography with tomography at the breast center, showing the breast density to be category capital C.  In the left breast that was not obscured mass in the upper outer quadrant, which by palpation was identified at the 1230 o'clock radiant 5 cm from the nipple.  By ultrasound this measured 2.9 cm, and it was irregular and hypoechoic.  The left axilla was sonographically benign.  Biopsy of the left breast mass in question May 14, 2017 showed [phone]) and invasive ductal carcinoma, grade 2 or more likely 3, estrogen and progesterone receptor negative, with no HER-2 amplification, the signals ratio being 1.18 and the number per cell 1.65.  The key 67 was 80%.  The patient's subsequent history is as detailed below.  INTERVAL HISTORY: Amy Dawson returns today for follow-up and treatment of her triple negative breast cancer.  The patient continues on Capecitabine, which she tolerates relatively well. She endorses soreness particularly in the soles of her feet but also somewhat in the  palms of her hands.. That improves throughout the day once she starts moving.  It is worse at night.  She continues to receive adjuvant radiation that she started on 12/16/2017. She is tolerating it well.  She has mild fatigue but "I pushed through it".  REVIEW OF SYSTEMS: Amy Dawson is doing well. The patient denies unusual headaches, visual changes, nausea, vomiting, or dizziness. There has been no unusual cough, phlegm production, or pleurisy. This been no change in bowel or bladder habits. The patient denies unexplained fatigue or unexplained weight loss, bleeding, rash, or fever. A detailed review of systems was otherwise noncontributory.   PAST MEDICAL HISTORY: Past Medical History:  Diagnosis Date  . Allergy   . Anxiety   . Breast cancer ([place])   . Chronic kidney disease    bladder issues  . Depression   . Family history of breast cancer   . Fibromyalgia   . Genetic testing 06/13/2017   Breast/GYN panel (23 genes) @ [facility] - No pathogenic mutations detected  . Headache    smokes marijuana for migraine pain  . Murmur, cardiac    as a child  . Personal history of chemotherapy 2018  . Stroke ([place]) 2000   numbness on left side  . TMJ (dislocation of temporomandibular joint)     PAST SURGICAL HISTORY: Past Surgical History:  Procedure Laterality Date  . BREAST BIOPSY Left 05/14/2017   malignant  . BREAST LUMPECTOMY WITH AXILLARY LYMPH NODE BIOPSY Left 11/13/2017   Procedure: LEFT BREAST LUMPECTOMY WITH SENTINEL LYMPH NODE BIOPSY;  Surgeon: [name], MD;  Location: [facility];  Service: General;  Laterality: Left;  . CESAREAN  SECTION     2  . ENDOMETRIAL ABLATION  2011  . PORTACATH PLACEMENT Right 05/31/2017   Procedure: INSERTION PORT-A-CATH WITH [place];  Surgeon: [name], MD;  Location: [facility];  Service: General;  Laterality: Right;    FAMILY HISTORY Family History  Problem Relation Age of Onset  . Liver cancer Sister         "rare type"; deceased 33  . Breast cancer Maternal Aunt        Dx 37s; currently 21s  . Breast cancer Paternal Grandmother        Dx 56s; deceased 34s  . Cancer Paternal Aunt        unk. type; deceased 1  . Colon cancer Neg Hx   . Esophageal cancer Neg Hx   . Rectal cancer Neg Hx   . Stomach cancer Neg Hx   The patient's parents are in their mid 44s as of November 2018.  The patient had 1 brother, 1 sister.  The sister died from a rare liver cancer at age 57.  A maternal aunt and a paternal grandmother were diagnosed with breast cancer in their 6s.  GYNECOLOGIC HISTORY:  No LMP recorded. Patient has had an ablation. Menarche age 44.  She underwent endometrial ablation 2011.  She is no longer having periods.  She never used hormone replacement.  First live birth was age 36.  She is GX P2.   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  Her husband Amy Dawson works as an Firefighter for [employer]  Daughter Jaasia Viglione attends [employer].  Son Krystol Rocco is doing on apprenticeship in the [employer] program.    ADVANCED DIRECTIVES: Not in place   HEALTH MAINTENANCE: Social History   Tobacco Use  . Smoking status: Former Research scientist (life sciences)  . Smokeless tobacco: Never Used  Substance Use Topics  . Alcohol use: Yes    Alcohol/week: 0.0 oz    Comment: sometimes  . Drug use: Yes    Types: Marijuana    Comment: last smoked 11-10-17 ("for anxiety"), uses daily     Colonoscopy: 2016/[facility]  PAP: October 2018  Bone density: Never   Allergies  Allergen Reactions  . Other Other (See Comments)    Red wine & white cheese: migraine Raw egg whites- hives    Current Outpatient Medications  Medication Sig Dispense Refill  . amitriptyline (ELAVIL) 50 MG tablet Take 25 mg by mouth at bedtime as needed for sleep.    Marland Kitchen azelastine (OPTIVAR) 0.05 % ophthalmic solution INSTILL 1 DROP INTO EACH EYE AS NEEDED  1  . Calcium Glycerophosphate (PRELIEF PO)  Take 2 tablets by mouth daily.     . capecitabine (XELODA) 500 MG tablet TAKE 2 TABLETS (1,000 MG TOTAL) BY MOUTH 2 (TWO) TIMES DAILY AFTER A MEAL. TAKE ON DAYS OF RADIATION ONLY, M-F 120 tablet 0  . Cetirizine HCl (ZYRTEC ALLERGY PO) Take by mouth daily.    Marland Kitchen dimenhyDRINATE (DRAMAMINE) 50 MG tablet Take 50 mg every 8 (eight) hours as needed by mouth.    . fluticasone (FLONASE) 50 MCG/ACT nasal spray Place 2 sprays into both nostrils daily.    . Multiple Vitamin (MULTIVITAMIN) tablet Take 1 tablet by mouth daily.    . Pseudoephedrine HCl (SUDAFED PO) Take by mouth as needed.     No current facility-administered medications for this visit.     OBJECTIVE: Middle-aged white woman in no acute distress  Vitals:  01/21/18 1335  BP: 128/87  Pulse: 71  Resp: 18  Temp: 97.9 F (36.6 C)  SpO2: 100%     Body mass index is 26.6 kg/m.   Wt Readings from Last 3 Encounters:  01/21/18 178 lb 12.8 oz (81.1 kg)  12/27/17 176 lb 4.8 oz (80 kg)  12/20/17 178 lb 4.8 oz (80.9 kg)  ECOG FS:1 - Symptomatic but completely ambulatory   Sclerae unicteric, EOMs intact Oropharynx clear and moist No cervical or supraclavicular adenopathy Lungs no rales or rhonchi Heart regular rate and rhythm Abd soft, nontender, positive bowel sounds MSK no focal spinal tenderness, no upper extremity lymphedema Neuro: nonfocal, well oriented, appropriate affect Breasts: The right breast is unremarkable.  The left breast is currently receiving radiation.  There is mild hyperpigmentation.  There is a little bit more irritation in the inframammary fold.  There is no peeling.  Both axillae are benign.   LAB RESULTS:  CMP     Component Value Date/Time   NA 138 01/21/2018 1315   NA 138 07/10/2017 1458   K 3.9 01/21/2018 1315   K 3.7 07/10/2017 1458   CL 101 01/21/2018 1315   CO2 31 01/21/2018 1315   CO2 25 07/10/2017 1458   GLUCOSE 107 (H) 01/21/2018 1315   GLUCOSE 95 07/10/2017 1458   BUN 8 01/21/2018 1315    BUN 7.4 07/10/2017 1458   CREATININE 0.82 01/21/2018 1315   CREATININE 0.7 07/10/2017 1458   CALCIUM 9.7 01/21/2018 1315   CALCIUM 8.9 07/10/2017 1458   PROT 6.9 01/21/2018 1315   PROT 7.1 07/10/2017 1458   ALBUMIN 4.1 01/21/2018 1315   ALBUMIN 3.8 07/10/2017 1458   AST 29 01/21/2018 1315   AST 10 07/10/2017 1458   ALT 30 01/21/2018 1315   ALT 11 07/10/2017 1458   ALKPHOS 86 01/21/2018 1315   ALKPHOS 92 07/10/2017 1458   BILITOT 0.4 01/21/2018 1315   BILITOT <0.22 07/10/2017 1458   GFRNONAA >60 01/21/2018 1315   GFRAA >60 01/21/2018 1315    No results found for: TOTALPROTELP, ALBUMINELP, A1GS, A2GS, BETS, BETA2SER, GAMS, MSPIKE, SPEI  No results found for: Nils Pyle, [facility]  Lab Results  Component Value Date   WBC 2.8 (L) 01/21/2018   NEUTROABS 1.6 01/21/2018   HGB 12.5 01/21/2018   HCT 37.5 01/21/2018   MCV 100.6 01/21/2018   PLT 195 01/21/2018      Chemistry      Component Value Date/Time   NA 138 01/21/2018 1315   NA 138 07/10/2017 1458   K 3.9 01/21/2018 1315   K 3.7 07/10/2017 1458   CL 101 01/21/2018 1315   CO2 31 01/21/2018 1315   CO2 25 07/10/2017 1458   BUN 8 01/21/2018 1315   BUN 7.4 07/10/2017 1458   CREATININE 0.82 01/21/2018 1315   CREATININE 0.7 07/10/2017 1458      Component Value Date/Time   CALCIUM 9.7 01/21/2018 1315   CALCIUM 8.9 07/10/2017 1458   ALKPHOS 86 01/21/2018 1315   ALKPHOS 92 07/10/2017 1458   AST 29 01/21/2018 1315   AST 10 07/10/2017 1458   ALT 30 01/21/2018 1315   ALT 11 07/10/2017 1458   BILITOT 0.4 01/21/2018 1315   BILITOT <0.22 07/10/2017 1458       No results found for: LABCA2  No components found for: LNLGXQ119  No results for input(s): INR in the last 168 hours.  No results found for: LABCA2  No results found for: ERD408  No results found  for: KWI097  No results found for: DZH299  No results found for: CA2729  No components found for: HGQUANT  No results found for: CEA1 / No  results found for: CEA1   No results found for: AFPTUMOR  No results found for: [place]  No results found for: PSA1  Appointment on 01/21/2018  Component Date Value Ref Range Status  . WBC 01/21/2018 2.8* 3.9 - 10.3 K/uL Final  . RBC 01/21/2018 3.72  3.70 - 5.45 MIL/uL Final  . Hemoglobin 01/21/2018 12.5  11.6 - 15.9 g/dL Final  . HCT 01/21/2018 37.5  34.8 - 46.6 % Final  . MCV 01/21/2018 100.6  79.5 - 101.0 fL Final  . MCH 01/21/2018 33.6  25.1 - 34.0 pg Final  . MCHC 01/21/2018 33.4  31.5 - 36.0 g/dL Final  . RDW 01/21/2018 16.1* 11.2 - 14.5 % Final  . Platelets 01/21/2018 195  145 - 400 K/uL Final  . Neutrophils Relative % 01/21/2018 56  % Final  . Neutro Abs 01/21/2018 1.6  1.5 - 6.5 K/uL Final  . Lymphocytes Relative 01/21/2018 27  % Final  . Lymphs Abs 01/21/2018 0.8* 0.9 - 3.3 K/uL Final  . Monocytes Relative 01/21/2018 14  % Final  . Monocytes Absolute 01/21/2018 0.4  0.1 - 0.9 K/uL Final  . Eosinophils Relative 01/21/2018 2  % Final  . Eosinophils Absolute 01/21/2018 0.1  0.0 - 0.5 K/uL Final  . Basophils Relative 01/21/2018 1  % Final  . Basophils Absolute 01/21/2018 0.0  0.0 - 0.1 K/uL Final   Performed at [facility], [place] [address] [place], [place] [address]  . Sodium 01/21/2018 138  135 - 145 mmol/L Final   Please note reference intervals were recently updated.  . Potassium 01/21/2018 3.9  3.5 - 5.1 mmol/L Final  . Chloride 01/21/2018 101  98 - 111 mmol/L Final  . CO2 01/21/2018 31  22 - 32 mmol/L Final  . Glucose, Bld 01/21/2018 107* 70 - 99 mg/dL Final  . BUN 01/21/2018 8  6 - 20 mg/dL Final   Please note change in reference range.  . Creatinine 01/21/2018 0.82  0.44 - 1.00 mg/dL Final  . Calcium 01/21/2018 9.7  8.9 - 10.3 mg/dL Final  . Total Protein 01/21/2018 6.9  6.5 - 8.1 g/dL Final  . Albumin 01/21/2018 4.1  3.5 - 5.0 g/dL Final  . AST 01/21/2018 29  15 - 41 U/L Final  . ALT 01/21/2018 30  0 - 44 U/L Final  . Alkaline  Phosphatase 01/21/2018 86  38 - 126 U/L Final  . Total Bilirubin 01/21/2018 0.4  0.3 - 1.2 mg/dL Final  . GFR, Est Non Af Am 01/21/2018 >60  >60 mL/min Final  . GFR, Est AFR Am 01/21/2018 >60  >60 mL/min Final   Comment: (NOTE) The eGFR has been calculated using the CKD EPI equation. This calculation has not been validated in all clinical situations. eGFR's persistently <60 mL/min signify possible Chronic Kidney Disease.   Georgiann Hahn gap 01/21/2018 6  5 - 15 Final   Performed at [facility], [place] [address], [place], [place] [address]    (this displays the last labs from the last 3 days)  No results found for: TOTALPROTELP, ALBUMINELP, A1GS, A2GS, BETS, BETA2SER, GAMS, MSPIKE, SPEI (this displays SPEP labs)  No results found for: KPAFRELGTCHN, LAMBDASER, KAPLAMBRATIO (kappa/lambda light chains)  No results found for: HGBA, HGBA2QUANT, HGBFQUANT, HGBSQUAN (Hemoglobinopathy evaluation)   No results found  for: LDH  No results found for: IRON, TIBC, IRONPCTSAT (Iron and TIBC)  No results found for: FERRITIN  Urinalysis    Component Value Date/Time   COLORURINE YELLOW 07/07/2017 1400   APPEARANCEUR CLEAR 07/07/2017 1400   LABSPEC 1.003 (L) 07/07/2017 1400   LABSPEC 1.010 05/30/2017 1315   PHURINE 7.0 07/07/2017 1400   GLUCOSEU NEGATIVE 07/07/2017 1400   GLUCOSEU Negative 05/30/2017 1315   HGBUR NEGATIVE 07/07/2017 1400   BILIRUBINUR NEGATIVE 07/07/2017 1400   BILIRUBINUR Color Interference 05/30/2017 1315   KETONESUR NEGATIVE 07/07/2017 1400   PROTEINUR NEGATIVE 07/07/2017 1400   UROBILINOGEN Color Interference 05/30/2017 1315   NITRITE NEGATIVE 07/07/2017 1400   LEUKOCYTESUR NEGATIVE 07/07/2017 1400   LEUKOCYTESUR Color Interference 05/30/2017 1315     STUDIES: No results found.  ELIGIBLE FOR AVAILABLE RESEARCH PROTOCOL: SWOG 0923 depending on response to neoadjuvant treatment, UPBEAT  ASSESSMENT: 52 y.o. [place] status post left breast  upper outer quadrant biopsy May 14, 2017 for a clinical T2 N0, stage IIB invasive ductal carcinoma, grade 3, triple negative, with an MIB-1 of 80%  (1) neoadjuvant chemotherapy consisting of doxorubicin and cyclophosphamide in dose dense fashion x4 started 06/04/2017, completed 07/17/2017; followed by weekly carboplatin and paclitaxel x12 started 07/30/2017, completed 10/22/2017  (a) echo on 05/30/2017: LVEF 55-60%  (2) Right lumpectomy and SLNB on 11/13/2017: showed ypT2 ypN0 residual invasive ductal carcinoma, grade 3, repeat prognostic panel again triple negative.  RCB-II  (3) adjuvant radiation with capecitabine sensitizaton  12/16/2017-01/31/2018   (a) capecitabine to be continued a total of 6 months  (4) genetics testing offered through [facility] 08/13/2016 showed: No deleterious mutations in ATM, BARD1, BRCA1, BRCA2, BRIP1, CDH1, CHEK2, DICER1, EPCAM*, MLH1, MSH2, MSH6, NBN, NF1, PALB2, PMS2, PTEN, RAD50, RAD51C, RAD51D, SMARCA4, STK11, TP53  (5) referral to SWOG R0076 in process  PLAN:  Russell is tolerating the radiation generally well.  I do not think we are going to be able to push the capecitabine any further.  She is going to develop significant pain in her hands and feet and so we are stopping that now.  She is very concerned because of stopping the "chemotherapy pill".  I reassured her that the main purpose was just to help the radiation work better and she has largely accomplish that already by taking it as long as she has.  I doubt that she would be able to tolerate full dose since she is not doing well on low-dose but she is interested in giving that a try.  I am more interested in her participating in the SWOG trial and today the the study nurse met with her briefly to introduce her to it.  Amy Dawson will return to see me in August and the choices at that point will be the study, capecitabine, or observation.  I, Margit Banda am acting as a Education administrator for Chauncey Cruel, MD.     I, Lurline Del MD, have reviewed the above documentation for accuracy and completeness, and I agree with the above.

## 2018-01-21 ENCOUNTER — Ambulatory Visit
Admission: RE | Admit: 2018-01-21 | Discharge: 2018-01-21 | Disposition: A | Discharge status: Home / Self Care | Payer: 59 | Source: Ambulatory Visit | Attending: Radiation Oncology | Admitting: Radiation Oncology

## 2018-01-21 ENCOUNTER — Inpatient Hospital Stay (HOSPITAL_BASED_OUTPATIENT_CLINIC_OR_DEPARTMENT_OTHER): Payer: 59 | Admitting: Oncology

## 2018-01-21 ENCOUNTER — Inpatient Hospital Stay: Payer: 59

## 2018-01-21 VITALS — BP 128/87 | HR 71 | Temp 97.9°F | Resp 18 | Ht 68.75 in | Wt 178.8 lb

## 2018-01-21 DIAGNOSIS — Z171 Estrogen receptor negative status [ER-]: Secondary | ICD-10-CM | POA: Diagnosis not present

## 2018-01-21 DIAGNOSIS — Z79899 Other long term (current) drug therapy: Secondary | ICD-10-CM | POA: Diagnosis not present

## 2018-01-21 DIAGNOSIS — C50412 Malignant neoplasm of upper-outer quadrant of left female breast: Secondary | ICD-10-CM

## 2018-01-21 DIAGNOSIS — F419 Anxiety disorder, unspecified: Secondary | ICD-10-CM | POA: Diagnosis not present

## 2018-01-21 DIAGNOSIS — Z803 Family history of malignant neoplasm of breast: Secondary | ICD-10-CM | POA: Diagnosis not present

## 2018-01-21 DIAGNOSIS — Z800 Family history of malignant neoplasm of digestive organs: Secondary | ICD-10-CM | POA: Diagnosis not present

## 2018-01-21 DIAGNOSIS — Z510 Encounter for antineoplastic radiation therapy: Secondary | ICD-10-CM | POA: Diagnosis not present

## 2018-01-21 DIAGNOSIS — Z87891 Personal history of nicotine dependence: Secondary | ICD-10-CM | POA: Diagnosis not present

## 2018-01-21 LAB — CBC WITH DIFFERENTIAL/PLATELET
BASOS ABS: 0 10*3/uL (ref 0.0–0.1)
Basophils Relative: 1 %
Eosinophils Absolute: 0.1 10*3/uL (ref 0.0–0.5)
Eosinophils Relative: 2 %
HCT: 37.5 % (ref 34.8–46.6)
HEMOGLOBIN: 12.5 g/dL (ref 11.6–15.9)
LYMPHS ABS: 0.8 10*3/uL — AB (ref 0.9–3.3)
LYMPHS PCT: 27 %
MCH: 33.6 pg (ref 25.1–34.0)
MCHC: 33.4 g/dL (ref 31.5–36.0)
MCV: 100.6 fL (ref 79.5–101.0)
MONO ABS: 0.4 10*3/uL (ref 0.1–0.9)
MONOS PCT: 14 %
Neutro Abs: 1.6 10*3/uL (ref 1.5–6.5)
Neutrophils Relative %: 56 %
Platelets: 195 10*3/uL (ref 145–400)
RBC: 3.72 MIL/uL (ref 3.70–5.45)
RDW: 16.1 % — ABNORMAL HIGH (ref 11.2–14.5)
WBC: 2.8 10*3/uL — ABNORMAL LOW (ref 3.9–10.3)

## 2018-01-21 LAB — CMP (CANCER CENTER ONLY)
ALK PHOS: 86 U/L (ref 38–126)
ALT: 30 U/L (ref 0–44)
ANION GAP: 6 (ref 5–15)
AST: 29 U/L (ref 15–41)
Albumin: 4.1 g/dL (ref 3.5–5.0)
BILIRUBIN TOTAL: 0.4 mg/dL (ref 0.3–1.2)
BUN: 8 mg/dL (ref 6–20)
CALCIUM: 9.7 mg/dL (ref 8.9–10.3)
CO2: 31 mmol/L (ref 22–32)
CREATININE: 0.82 mg/dL (ref 0.44–1.00)
Chloride: 101 mmol/L (ref 98–111)
Glucose, Bld: 107 mg/dL — ABNORMAL HIGH (ref 70–99)
Potassium: 3.9 mmol/L (ref 3.5–5.1)
SODIUM: 138 mmol/L (ref 135–145)
TOTAL PROTEIN: 6.9 g/dL (ref 6.5–8.1)

## 2018-01-22 ENCOUNTER — Ambulatory Visit
Admission: RE | Admit: 2018-01-22 | Discharge: 2018-01-22 | Disposition: A | Discharge status: Home / Self Care | Payer: 59 | Source: Ambulatory Visit | Attending: Radiation Oncology | Admitting: Radiation Oncology

## 2018-01-22 DIAGNOSIS — Z510 Encounter for antineoplastic radiation therapy: Secondary | ICD-10-CM | POA: Diagnosis not present

## 2018-01-23 ENCOUNTER — Ambulatory Visit
Admission: RE | Admit: 2018-01-23 | Discharge: 2018-01-23 | Disposition: A | Discharge status: Home / Self Care | Payer: 59 | Source: Ambulatory Visit | Attending: Radiation Oncology | Admitting: Radiation Oncology

## 2018-01-23 ENCOUNTER — Telehealth: Payer: Self-pay | Admitting: Oncology

## 2018-01-23 DIAGNOSIS — Z510 Encounter for antineoplastic radiation therapy: Secondary | ICD-10-CM | POA: Diagnosis not present

## 2018-01-23 NOTE — Telephone Encounter (Signed)
Scheduled visit per GM, 7/9 los.  Called patient and left message.  Mailed calendar.

## 2018-01-24 ENCOUNTER — Ambulatory Visit
Admission: RE | Admit: 2018-01-24 | Discharge: 2018-01-24 | Disposition: A | Discharge status: Home / Self Care | Payer: 59 | Source: Ambulatory Visit | Attending: Radiation Oncology | Admitting: Radiation Oncology

## 2018-01-24 ENCOUNTER — Ambulatory Visit: Payer: 59

## 2018-01-24 DIAGNOSIS — Z510 Encounter for antineoplastic radiation therapy: Secondary | ICD-10-CM | POA: Diagnosis not present

## 2018-01-27 ENCOUNTER — Ambulatory Visit: Payer: 59

## 2018-01-27 ENCOUNTER — Ambulatory Visit
Admission: RE | Admit: 2018-01-27 | Discharge: 2018-01-27 | Disposition: A | Discharge status: Home / Self Care | Payer: 59 | Source: Ambulatory Visit | Attending: Radiation Oncology | Admitting: Radiation Oncology

## 2018-01-27 DIAGNOSIS — Z510 Encounter for antineoplastic radiation therapy: Secondary | ICD-10-CM | POA: Diagnosis not present

## 2018-01-28 ENCOUNTER — Ambulatory Visit
Admission: RE | Admit: 2018-01-28 | Discharge: 2018-01-28 | Disposition: A | Discharge status: Home / Self Care | Payer: 59 | Source: Ambulatory Visit | Attending: Radiation Oncology | Admitting: Radiation Oncology

## 2018-01-28 DIAGNOSIS — Z510 Encounter for antineoplastic radiation therapy: Secondary | ICD-10-CM | POA: Diagnosis not present

## 2018-01-29 ENCOUNTER — Ambulatory Visit
Admission: RE | Admit: 2018-01-29 | Discharge: 2018-01-29 | Disposition: A | Discharge status: Home / Self Care | Payer: 59 | Source: Ambulatory Visit | Attending: Radiation Oncology | Admitting: Radiation Oncology

## 2018-01-29 DIAGNOSIS — Z510 Encounter for antineoplastic radiation therapy: Secondary | ICD-10-CM | POA: Diagnosis not present

## 2018-01-30 ENCOUNTER — Ambulatory Visit: Payer: 59

## 2018-01-30 ENCOUNTER — Ambulatory Visit
Admission: RE | Admit: 2018-01-30 | Discharge: 2018-01-30 | Disposition: A | Discharge status: Home / Self Care | Payer: 59 | Source: Ambulatory Visit | Attending: Radiation Oncology | Admitting: Radiation Oncology

## 2018-01-30 DIAGNOSIS — Z510 Encounter for antineoplastic radiation therapy: Secondary | ICD-10-CM | POA: Diagnosis not present

## 2018-01-31 ENCOUNTER — Encounter: Payer: Self-pay | Admitting: Radiation Oncology

## 2018-01-31 ENCOUNTER — Ambulatory Visit
Admission: RE | Admit: 2018-01-31 | Discharge: 2018-01-31 | Disposition: A | Discharge status: Home / Self Care | Payer: 59 | Source: Ambulatory Visit | Attending: Radiation Oncology | Admitting: Radiation Oncology

## 2018-01-31 ENCOUNTER — Encounter: Payer: Self-pay | Admitting: Oncology

## 2018-01-31 DIAGNOSIS — Z510 Encounter for antineoplastic radiation therapy: Secondary | ICD-10-CM | POA: Diagnosis not present

## 2018-01-31 NOTE — Progress Notes (Signed)
Received call back from Sarita Bottom (case manager) with [facility], she stated that pt must call her and say she wants to be a part of the cancer support plan.  I have contacted Doristine Johns RN and told her this information. Lexine Baton ask me if I could call the pt.  I called [phone] and spoke with Pt son (?justin or jason-very muffled,could not understand ) he said he will have his mom call Sarita Bottom. I left her phone number w/ ext.

## 2018-02-03 ENCOUNTER — Encounter: Payer: Self-pay | Admitting: *Deleted

## 2018-02-03 NOTE — Progress Notes (Signed)
02/03/18 at 11:34 am - UpBeat study- End of Radiation Treatment CRF05 completion. The research nurse worked with Tiburcio Pea, physicist, to complete the Radiation Treatment Summary Form [id number]).  The pt received radiation treatment from 12/16/17 through 01/31/18.  Tiburcio Pea provided the number of fractions, doses, and beam energy for both the initial treatment fields and the boost treatment fields.  He also reviewed the pt's daily radiation records and confirmed that the pt had no treatment breaks related to skin toxicity.  According to the pt assessments, the pt had a RTOG score of grade 1.  He also sent the pt's treatment records and DICOM data of all the pt's radiation treatments to [place] RN, BSN, CCRP Clinical Research Nurse 02/03/2018 11:44 AM

## 2018-02-10 ENCOUNTER — Telehealth: Payer: Self-pay | Admitting: *Deleted

## 2018-02-10 NOTE — Progress Notes (Signed)
  Radiation Oncology         [phone] ________________________________  Name: Amy Dawson MRN: [id number]  Date: 01/31/2018  DOB: Aug 18, 1965  End of Treatment Note  Diagnosis:   52 y.o. female with Stage IIB, pT2N0M0 grade 2-3 triple negative invasive ductal carcinoma of the left breast    Indication for treatment:  Curative       Radiation treatment dates:   12/16/2017 - 01/31/2018  Site/dose:   The patient initially received a dose of 50.4 Gy in 28 fractions to the left breast using whole-breast tangent fields. This was delivered using a 3-D conformal technique. The patient then received a boost to the seroma. This delivered an additional 10 Gy in 5 fractions using 12E electrons. The total dose was 60.4 Gy.  Narrative: The patient tolerated radiation treatment relatively well.   The patient had some expected skin irritation as she progressed during treatment. Moist desquamation was not present at the end of treatment.   Plan: The patient has completed radiation treatment. I advised the patient to continue applying Radiaplex to her skin after radiation. The patient will return to radiation oncology clinic for routine followup in one month. I advised the patient to call or return sooner if they have any questions or concerns related to their recovery or treatment. ________________________________  Jodelle Gross, MD, PhD  This document serves as a record of services personally performed by [name], MD. It was created on his behalf by Rae Lips, a trained medical scribe. The creation of this record is based on the scribe's personal observations and the provider's statements to them. This document has been checked and approved by the attending provider.

## 2018-02-10 NOTE — Telephone Encounter (Signed)
This RN spoke with pt per her call wanting to let MD know she " found a lump on my inner right thigh "  Baeleigh states nodule is " about the size of a pea or a butterbean ", it is movable, non tender and without any redness.  Above discussed - this RN offered for pt to come in for evaluation with Tressa stating - " no I have an appointment on Aug 16 - just wanted Dr [name] to know "  Plan is for pt to monitor- if worsens she knows to call.  MD will be made aware of pt's concern.

## 2018-02-17 ENCOUNTER — Other Ambulatory Visit: Payer: Self-pay | Admitting: *Deleted

## 2018-02-17 DIAGNOSIS — Z171 Estrogen receptor negative status [ER-]: Principal | ICD-10-CM

## 2018-02-17 DIAGNOSIS — Z79899 Other long term (current) drug therapy: Secondary | ICD-10-CM

## 2018-02-17 DIAGNOSIS — C50412 Malignant neoplasm of upper-outer quadrant of left female breast: Secondary | ICD-10-CM

## 2018-02-18 ENCOUNTER — Telehealth: Payer: Self-pay | Admitting: Oncology

## 2018-02-18 NOTE — Telephone Encounter (Signed)
Called pt re appts that were added per 8/5 sch msg - left vm for pt re appts.  °

## 2018-02-19 ENCOUNTER — Inpatient Hospital Stay: Payer: 59 | Attending: Oncology | Admitting: Adult Health

## 2018-02-19 ENCOUNTER — Inpatient Hospital Stay: Payer: 59

## 2018-02-19 ENCOUNTER — Telehealth: Payer: Self-pay | Admitting: Adult Health

## 2018-02-19 VITALS — BP 118/90 | HR 81 | Temp 97.9°F | Resp 18 | Ht 68.75 in | Wt 174.6 lb

## 2018-02-19 DIAGNOSIS — B373 Candidiasis of vulva and vagina: Secondary | ICD-10-CM | POA: Insufficient documentation

## 2018-02-19 DIAGNOSIS — Z79899 Other long term (current) drug therapy: Secondary | ICD-10-CM

## 2018-02-19 DIAGNOSIS — R590 Localized enlarged lymph nodes: Secondary | ICD-10-CM | POA: Insufficient documentation

## 2018-02-19 DIAGNOSIS — Z9221 Personal history of antineoplastic chemotherapy: Secondary | ICD-10-CM | POA: Insufficient documentation

## 2018-02-19 DIAGNOSIS — Z006 Encounter for examination for normal comparison and control in clinical research program: Secondary | ICD-10-CM | POA: Insufficient documentation

## 2018-02-19 DIAGNOSIS — Z95828 Presence of other vascular implants and grafts: Secondary | ICD-10-CM

## 2018-02-19 DIAGNOSIS — C50412 Malignant neoplasm of upper-outer quadrant of left female breast: Secondary | ICD-10-CM | POA: Insufficient documentation

## 2018-02-19 DIAGNOSIS — Z171 Estrogen receptor negative status [ER-]: Secondary | ICD-10-CM | POA: Diagnosis not present

## 2018-02-19 DIAGNOSIS — Z923 Personal history of irradiation: Secondary | ICD-10-CM | POA: Diagnosis not present

## 2018-02-19 LAB — CBC WITH DIFFERENTIAL (CANCER CENTER ONLY)
BASOS ABS: 0 10*3/uL (ref 0.0–0.1)
BASOS PCT: 1 %
EOS ABS: 0.1 10*3/uL (ref 0.0–0.5)
EOS PCT: 1 %
HCT: 39.8 % (ref 34.8–46.6)
Hemoglobin: 13.6 g/dL (ref 11.6–15.9)
Lymphocytes Relative: 24 %
Lymphs Abs: 1 10*3/uL (ref 0.9–3.3)
MCH: 33.3 pg (ref 25.1–34.0)
MCHC: 34.1 g/dL (ref 31.5–36.0)
MCV: 97.7 fL (ref 79.5–101.0)
Monocytes Absolute: 0.6 10*3/uL (ref 0.1–0.9)
Monocytes Relative: 13 %
Neutro Abs: 2.6 10*3/uL (ref 1.5–6.5)
Neutrophils Relative %: 61 %
PLATELETS: 218 10*3/uL (ref 145–400)
RBC: 4.07 MIL/uL (ref 3.70–5.45)
RDW: 14.7 % — ABNORMAL HIGH (ref 11.2–14.5)
WBC Count: 4.2 10*3/uL (ref 3.9–10.3)

## 2018-02-19 LAB — CMP (CANCER CENTER ONLY)
ALT: 18 U/L (ref 0–44)
AST: 21 U/L (ref 15–41)
Albumin: 4 g/dL (ref 3.5–5.0)
Alkaline Phosphatase: 101 U/L (ref 38–126)
Anion gap: 10 (ref 5–15)
BILIRUBIN TOTAL: 0.3 mg/dL (ref 0.3–1.2)
BUN: 10 mg/dL (ref 6–20)
CALCIUM: 9.7 mg/dL (ref 8.9–10.3)
CO2: 28 mmol/L (ref 22–32)
CREATININE: 0.86 mg/dL (ref 0.44–1.00)
Chloride: 99 mmol/L (ref 98–111)
GFR, Est AFR Am: >60 mL/min (ref >60)
Glucose, Bld: 125 mg/dL — ABNORMAL HIGH (ref 70–99)
Potassium: 4 mmol/L (ref 3.5–5.1)
Sodium: 137 mmol/L (ref 135–145)
TOTAL PROTEIN: 7.7 g/dL (ref 6.5–8.1)

## 2018-02-19 LAB — RESEARCH LABS

## 2018-02-19 LAB — TSH: TSH: 1.475 u[IU]/mL (ref 0.308–3.960)

## 2018-02-19 MED ORDER — HEPARIN SOD (PORK) LOCK FLUSH 100 UNIT/ML IV SOLN
500.0000 [IU] | Freq: Once | INTRAVENOUS | Status: AC
  Administered 2018-02-19: 500 [IU]
  Filled 2018-02-19: qty 5

## 2018-02-19 MED ORDER — SODIUM CHLORIDE 0.9% FLUSH
10.0000 mL | Freq: Once | INTRAVENOUS | Status: AC
  Administered 2018-02-19: 10 mL
  Filled 2018-02-19: qty 10

## 2018-02-19 NOTE — Telephone Encounter (Signed)
No 8/7 los/orders/referrals  °

## 2018-02-19 NOTE — Progress Notes (Addendum)
[place]  [phone] [phone]     ID: Amy Dawson DOB: Jan 16, 1966  MR#: [id number]  HQP#:[id number]  Patient Care Team: Amy Freeze, MD as PCP - General (Family Medicine) Dawson, Amy Dad, MD as Consulting Physician (Oncology) Amy Bookbinder, MD as Consulting Physician (General Surgery) Amy Rudd, MD as Consulting Physician (Radiation Oncology) Amy Gully, NP as Nurse Practitioner (Obstetrics and Gynecology) OTHER MD:  CHIEF COMPLAINT: Triple negative breast cancer  CURRENT TREATMENT: [Capecitabine], adjuvant Radiation  HISTORY OF CURRENT ILLNESS: From the original intake note:  Amy Dawson felt some discomfort in the left breast in June 2018, but did not think much of it.  However in October she palpated a change in the upper outer quadrant of her left breast and she brought this to medical attention.  On May 09, 2017 she had bilateral diagnostic mammography with tomography at the breast center, showing the breast density to be category capital C.  In the left breast that was not obscured mass in the upper outer quadrant, which by palpation was identified at the 1230 o'clock radiant 5 cm from the nipple.  By ultrasound this measured 2.9 cm, and it was irregular and hypoechoic.  The left axilla was sonographically benign.  Biopsy of the left breast mass in question May 14, 2017 showed [phone]) and invasive ductal carcinoma, grade 2 or more likely 3, estrogen and progesterone receptor negative, with no HER-2 amplification, the signals ratio being 1.18 and the number per cell 1.65.  The key 67 was 80%.  The patient's subsequent history is as detailed below.  INTERVAL HISTORY: Amy Dawson returns today for follow-up and treatment of her triple negative breast cancer.  She is doing well today.  She is here today for an evaluation of a nodule on her inner right thigh that is about the size of a pea or butterbean.   It has been  present for about a week or two.  She noted it prior to going to the beach.  She does shave her bikini line.  The nodule is not painful, or tender.  It is not red.  She can't tell if it is worsening at this point.  She notes she had one episode of dyspareunia in the past few weeks.   REVIEW OF SYSTEMS: See is doing well.  Other than this spot on her thigh she has had no changes whatsoever.  She has not had a menstrual cycle since 2014.  She also notes her husband has undergone an vasectomy and they are in a monogamous relationship.  She denies any new pain or symptoms since her last visit.  A detailed ROS was otherwise non contributory.     PAST MEDICAL HISTORY: Past Medical History:  Diagnosis Date  . Allergy   . Anxiety   . Breast cancer ([place])   . Chronic kidney disease    bladder issues  . Depression   . Family history of breast cancer   . Fibromyalgia   . Genetic testing 06/13/2017   Breast/GYN panel (23 genes) @ [facility] - No pathogenic mutations detected  . Headache    smokes marijuana for migraine pain  . Murmur, cardiac    as a child  . Personal history of chemotherapy 2018  . Stroke ([place]) 2000   numbness on left side  . TMJ (dislocation of temporomandibular joint)     PAST SURGICAL HISTORY: Past Surgical History:  Procedure Laterality Date  . BREAST BIOPSY Left 05/14/2017   malignant  .  BREAST LUMPECTOMY WITH AXILLARY LYMPH NODE BIOPSY Left 11/13/2017   Procedure: LEFT BREAST LUMPECTOMY WITH SENTINEL LYMPH NODE BIOPSY;  Surgeon: Amy Bookbinder, MD;  Location: [place];  Service: General;  Laterality: Left;  . CESAREAN SECTION     2  . ENDOMETRIAL ABLATION  2011  . PORTACATH PLACEMENT Right 05/31/2017   Procedure: INSERTION PORT-A-CATH WITH [place];  Surgeon: Amy Bookbinder, MD;  Location: [place];  Service: General;  Laterality: Right;    FAMILY HISTORY Family History  Problem Relation Age of Onset  . Liver cancer Sister         "rare type"; deceased 40  . Breast cancer Maternal Aunt        Dx 31s; currently 53s  . Breast cancer Paternal Grandmother        Dx 91s; deceased 2s  . Cancer Paternal Aunt        unk. type; deceased 53  . Colon cancer Neg Hx   . Esophageal cancer Neg Hx   . Rectal cancer Neg Hx   . Stomach cancer Neg Hx   The patient's parents are in their mid 72s as of November 2018.  The patient had 1 brother, 1 sister.  The sister died from a rare liver cancer at age 46.  A maternal aunt and a paternal grandmother were diagnosed with breast cancer in their 31s.  GYNECOLOGIC HISTORY:  No LMP recorded. Patient has had an ablation. Menarche age 39.  She underwent endometrial ablation 2011.  She is no longer having periods.  She never used hormone replacement.  First live birth was age 30.  She is GX P2.   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  Her husband Amy Dawson works as an Firefighter for [employer]  Daughter Amy Dawson attends [employer].  Son Amy Dawson is doing on apprenticeship in the [employer] program.    ADVANCED DIRECTIVES: Not in place   HEALTH MAINTENANCE: Social History   Tobacco Use  . Smoking status: Former Research scientist (life sciences)  . Smokeless tobacco: Never Used  Substance Use Topics  . Alcohol use: Yes    Alcohol/week: 0.0 oz    Comment: sometimes  . Drug use: Yes    Types: Marijuana    Comment: last smoked 11-10-17 ("for anxiety"), uses daily     Colonoscopy: 2016/[facility]  PAP: October 2018  Bone density: Never   Allergies  Allergen Reactions  . Other Other (See Comments)    Red wine & white cheese: migraine Raw egg whites- hives    Current Outpatient Medications  Medication Sig Dispense Refill  . amitriptyline (ELAVIL) 50 MG tablet Take 25 mg by mouth at bedtime as needed for sleep.    Marland Kitchen azelastine (OPTIVAR) 0.05 % ophthalmic solution INSTILL 1 DROP INTO EACH EYE AS NEEDED  1  . Calcium Glycerophosphate (PRELIEF  PO) Take 2 tablets by mouth daily.     . Cetirizine HCl (ZYRTEC ALLERGY PO) Take by mouth daily.    Marland Kitchen dimenhyDRINATE (DRAMAMINE) 50 MG tablet Take 50 mg every 8 (eight) hours as needed by mouth.    . fluticasone (FLONASE) 50 MCG/ACT nasal spray Place 2 sprays into both nostrils daily.    . Multiple Vitamin (MULTIVITAMIN) tablet Take 1 tablet by mouth daily.    Marland Kitchen OVER THE COUNTER MEDICATION as needed (excedrine - tension headache).    . OVER THE COUNTER MEDICATION as needed (Excedrine - pain reliever).    Marland Kitchen  Pseudoephedrine HCl (SUDAFED PO) Take by mouth as needed.    . Pseudoephedrine-guaiFENesin (MUCINEX D PO) Take by mouth as needed.     No current facility-administered medications for this visit.     OBJECTIVE:  Vitals:   02/19/18 0955  BP: 118/90  Pulse: 81  Resp: 18  Temp: 97.9 F (36.6 C)  SpO2: 100%     Body mass index is 25.97 kg/m.   Wt Readings from Last 3 Encounters:  02/19/18 174 lb 9.6 oz (79.2 kg)  01/21/18 178 lb 12.8 oz (81.1 kg)  12/27/17 176 lb 4.8 oz (80 kg)  ECOG FS:1 - Symptomatic but completely ambulatory   GENERAL: Patient is a well appearing female in no acute distress HEENT:  Sclerae anicteric.  Oropharynx clear and moist. No ulcerations or evidence of oropharyngeal candidiasis. Neck is supple.  NODES:  No cervical, supraclavicular, or axillary lymphadenopathy palpated. + right internal femoral lymph node is enlarged and about 2cm, non tender BREAST EXAM:  Deferred. LUNGS:  Clear to auscultation bilaterally.  No wheezes or rhonchi. HEART:  Regular rate and rhythm. No murmur appreciated. ABDOMEN:  Soft, nontender.  Positive, normoactive bowel sounds. No organomegaly palpated. MSK:  No focal spinal tenderness to palpation. Full range of motion bilaterally in the upper extremities. EXTREMITIES:  No peripheral edema.   SKIN:  Clear with no obvious rashes or skin changes. No nail dyscrasia. NEURO:  Nonfocal. Well oriented.  Appropriate affect.     LAB  RESULTS:  CMP     Component Value Date/Time   NA 138 01/21/2018 1315   NA 138 07/10/2017 1458   K 3.9 01/21/2018 1315   K 3.7 07/10/2017 1458   CL 101 01/21/2018 1315   CO2 31 01/21/2018 1315   CO2 25 07/10/2017 1458   GLUCOSE 107 (H) 01/21/2018 1315   GLUCOSE 95 07/10/2017 1458   BUN 8 01/21/2018 1315   BUN 7.4 07/10/2017 1458   CREATININE 0.82 01/21/2018 1315   CREATININE 0.7 07/10/2017 1458   CALCIUM 9.7 01/21/2018 1315   CALCIUM 8.9 07/10/2017 1458   PROT 6.9 01/21/2018 1315   PROT 7.1 07/10/2017 1458   ALBUMIN 4.1 01/21/2018 1315   ALBUMIN 3.8 07/10/2017 1458   AST 29 01/21/2018 1315   AST 10 07/10/2017 1458   ALT 30 01/21/2018 1315   ALT 11 07/10/2017 1458   ALKPHOS 86 01/21/2018 1315   ALKPHOS 92 07/10/2017 1458   BILITOT 0.4 01/21/2018 1315   BILITOT <0.22 07/10/2017 1458   GFRNONAA >60 01/21/2018 1315   GFRAA >60 01/21/2018 1315    No results found for: TOTALPROTELP, ALBUMINELP, A1GS, A2GS, BETS, BETA2SER, GAMS, MSPIKE, SPEI  No results found for: KPAFRELGTCHN, LAMBDASER, [facility]  Lab Results  Component Value Date   WBC 4.2 02/19/2018   NEUTROABS 2.6 02/19/2018   HGB 13.6 02/19/2018   HCT 39.8 02/19/2018   MCV 97.7 02/19/2018   PLT 218 02/19/2018      Chemistry      Component Value Date/Time   NA 138 01/21/2018 1315   NA 138 07/10/2017 1458   K 3.9 01/21/2018 1315   K 3.7 07/10/2017 1458   CL 101 01/21/2018 1315   CO2 31 01/21/2018 1315   CO2 25 07/10/2017 1458   BUN 8 01/21/2018 1315   BUN 7.4 07/10/2017 1458   CREATININE 0.82 01/21/2018 1315   CREATININE 0.7 07/10/2017 1458      Component Value Date/Time   CALCIUM 9.7 01/21/2018 1315   CALCIUM 8.9 07/10/2017 1458  ALKPHOS 86 01/21/2018 1315   ALKPHOS 92 07/10/2017 1458   AST 29 01/21/2018 1315   AST 10 07/10/2017 1458   ALT 30 01/21/2018 1315   ALT 11 07/10/2017 1458   BILITOT 0.4 01/21/2018 1315   BILITOT <0.22 07/10/2017 1458       No results found for: LABCA2  No  components found for: UXNATF573  No results for input(s): INR in the last 168 hours.  No results found for: LABCA2  No results found for: UKG254  No results found for: YHC623  No results found for: JSE831  No results found for: CA2729  No components found for: HGQUANT  No results found for: CEA1 / No results found for: CEA1   No results found for: AFPTUMOR  No results found for: CHROMOGRNA  No results found for: PSA1  Appointment on 02/19/2018  Component Date Value Ref Range Status  . WBC Count 02/19/2018 4.2  3.9 - 10.3 K/uL Final  . RBC 02/19/2018 4.07  3.70 - 5.45 MIL/uL Final  . Hemoglobin 02/19/2018 13.6  11.6 - 15.9 g/dL Final  . HCT 02/19/2018 39.8  34.8 - 46.6 % Final  . MCV 02/19/2018 97.7  79.5 - 101.0 fL Final  . MCH 02/19/2018 33.3  25.1 - 34.0 pg Final  . MCHC 02/19/2018 34.1  31.5 - 36.0 g/dL Final  . RDW 02/19/2018 14.7* 11.2 - 14.5 % Final  . Platelet Count 02/19/2018 218  145 - 400 K/uL Final  . Neutrophils Relative % 02/19/2018 61  % Final  . Neutro Abs 02/19/2018 2.6  1.5 - 6.5 K/uL Final  . Lymphocytes Relative 02/19/2018 24  % Final  . Lymphs Abs 02/19/2018 1.0  0.9 - 3.3 K/uL Final  . Monocytes Relative 02/19/2018 13  % Final  . Monocytes Absolute 02/19/2018 0.6  0.1 - 0.9 K/uL Final  . Eosinophils Relative 02/19/2018 1  % Final  . Eosinophils Absolute 02/19/2018 0.1  0.0 - 0.5 K/uL Final  . Basophils Relative 02/19/2018 1  % Final  . Basophils Absolute 02/19/2018 0.0  0.0 - 0.1 K/uL Final   Performed at [facility], [place] [address], [place], [place] [address]  . Research Labs 02/19/2018 COLLECTED BY LABORATORY   Final   Performed at [facility], [address] [place], [place] [address]    (this displays the last labs from the last 3 days)  No results found for: TOTALPROTELP, ALBUMINELP, A1GS, A2GS, BETS, BETA2SER, GAMS, MSPIKE, SPEI (this displays SPEP labs)  No results found for:  KPAFRELGTCHN, LAMBDASER, KAPLAMBRATIO (kappa/lambda light chains)  No results found for: HGBA, HGBA2QUANT, HGBFQUANT, HGBSQUAN (Hemoglobinopathy evaluation)   No results found for: LDH  No results found for: IRON, TIBC, IRONPCTSAT (Iron and TIBC)  No results found for: FERRITIN  Urinalysis    Component Value Date/Time   COLORURINE YELLOW 07/07/2017 1400   APPEARANCEUR CLEAR 07/07/2017 1400   LABSPEC 1.003 (L) 07/07/2017 1400   LABSPEC 1.010 05/30/2017 1315   PHURINE 7.0 07/07/2017 1400   GLUCOSEU NEGATIVE 07/07/2017 1400   GLUCOSEU Negative 05/30/2017 1315   HGBUR NEGATIVE 07/07/2017 1400   BILIRUBINUR NEGATIVE 07/07/2017 1400   BILIRUBINUR Color Interference 05/30/2017 1315   KETONESUR NEGATIVE 07/07/2017 1400   PROTEINUR NEGATIVE 07/07/2017 1400   UROBILINOGEN Color Interference 05/30/2017 1315   NITRITE NEGATIVE 07/07/2017 1400   LEUKOCYTESUR NEGATIVE 07/07/2017 1400   LEUKOCYTESUR Color Interference 05/30/2017 1315     STUDIES: No results found.  ELIGIBLE FOR AVAILABLE  RESEARCH PROTOCOL: SWOG 1418 depending on response to neoadjuvant treatment, UPBEAT  ASSESSMENT: 52 y.o. [place] status post left breast upper outer quadrant biopsy May 14, 2017 for a clinical T2 N0, stage IIB invasive ductal carcinoma, grade 3, triple negative, with an MIB-1 of 80%  (1) neoadjuvant chemotherapy consisting of doxorubicin and cyclophosphamide in dose dense fashion x4 started 06/04/2017, completed 07/17/2017; followed by weekly carboplatin and paclitaxel x12 started 07/30/2017, completed 10/22/2017  (a) echo on 05/30/2017: LVEF 55-60%  (2) Right lumpectomy and SLNB on 11/13/2017: showed ypT2 ypN0 residual invasive ductal carcinoma, grade 3, repeat prognostic panel again triple negative.  RCB-II  (3) adjuvant radiation with capecitabine sensitizaton  12/16/2017-01/31/2018, she did experience planter erythrodysesthesia with the Capecitabine, so it was not continued for six months  after sensitization    (4) genetics testing offered through [facility] 08/13/2016 showed: No deleterious mutations in ATM, BARD1, BRCA1, BRCA2, BRIP1, CDH1, CHEK2, DICER1, EPCAM*, MLH1, MSH2, MSH6, NBN, NF1, PALB2, PMS2, PTEN, RAD50, RAD51C, RAD51D, SMARCA4, STK11, TP53  (5) referral to SWOG O0600 in process  PLAN:  Takenya is doing moderately well today, and is here for evaluation of this lymph node.  Dr. [name] came in the room and examined her.  He believes this lymph node is reactive and benign.  This should improve in a couple of weeks.  Amelia verbalized understanding.  She will return in one week for follow up with Dr. [name].    Research RN Doristine Johns was in the room for the entirety of the visit.    She knows to call for any questions or concerns prior to her next appointment.  Wilber Bihari, NP 02/19/2018   ADDENDUM: Amy Dawson was concerned because she has developed a mass in her right inner thigh.  This is consistent with a lymph node and it measures approximately 2 cm.  It is not associated with erythema and it is not tender to palpation.  It is rubbery, not hard.  This is most likely a reactive lymph node and at this point only requires follow-up.  She already has an appointment with me on 02/28/2018 and I will reassess it at that point.  If it has not resolved however or is not clearly in the process of resolution we may have to consider additional work-up or excision  I personally saw this patient and performed a substantive portion of this encounter with the listed APP documented above.   Chauncey Cruel, MD Medical Oncology and Hematology [facility] [address] [place], [place] [address] Tel. [phone]    Fax. [phone]

## 2018-02-19 NOTE — Patient Instructions (Signed)

## 2018-02-20 ENCOUNTER — Telehealth: Payer: Self-pay | Admitting: Oncology

## 2018-02-20 ENCOUNTER — Encounter: Payer: Self-pay | Admitting: Adult Health

## 2018-02-20 NOTE — Telephone Encounter (Signed)
Faxed medical records to [employer] at [phone], Release ID: [id number]

## 2018-02-20 NOTE — Telephone Encounter (Signed)
Patient scheduled per GM request.

## 2018-02-27 NOTE — Progress Notes (Signed)
[place]  [phone] [phone]     ID: Amy Dawson DOB: Apr 14, 1966  MR#: [id number]  VOZ#:[id number]  Patient Care Team: Amy Freeze, MD as PCP - General (Family Medicine) Dawson, Amy Dad, MD as Consulting Physician (Oncology) Amy Bookbinder, MD as Consulting Physician (General Surgery) Amy Rudd, MD as Consulting Physician (Radiation Oncology) Amy Gully, NP as Nurse Practitioner (Obstetrics and Gynecology) OTHER MD:  CHIEF COMPLAINT: Triple negative breast cancer  CURRENT TREATMENT: pembrolizumab  HISTORY OF CURRENT ILLNESS: From the original intake note:  Amy Dawson felt some discomfort in the left breast in June 2018, but did not think much of it.  However in October she palpated a change in the upper outer quadrant of her left breast and she brought this to medical attention.  On May 09, 2017 she had bilateral diagnostic mammography with tomography at the breast center, showing the breast density to be category capital C.  In the left breast that was not obscured mass in the upper outer quadrant, which by palpation was identified at the 1230 o'clock radiant 5 cm from the nipple.  By ultrasound this measured 2.9 cm, and it was irregular and hypoechoic.  The left axilla was sonographically benign.  Biopsy of the left breast mass in question May 14, 2017 showed [phone]) and invasive ductal carcinoma, grade 2 or more likely 3, estrogen and progesterone receptor negative, with no HER-2 amplification, the signals ratio being 1.18 and the number per cell 1.65.  The key 67 was 80%.  The patient's subsequent history is as detailed below.  INTERVAL HISTORY: Amy Dawson returns today for follow-up and treatment of her triple negative breast cancer. She is participating in our pembrolizumab SWOG [id number] trial. Today is day 1 cycle 1, with [place] scheduled to be repeated every 21 days for 1 year.   REVIEW OF SYSTEMS: Amy Dawson reports  that she is treating a yeast injection with monistat 1-day and and then she did a 3-day treatment. She is not sure if this is helping.  She notes that her energy is good. She was walking 5 miles almost every day, but this has decreased due to the hot weather. She denies unusual headaches, visual changes, nausea, vomiting, or dizziness. There has been no unusual cough, phlegm production, or pleurisy. There has been no change in bowel or bladder habits. She denies unexplained fatigue or unexplained weight loss, bleeding, rash, or fever. A detailed review of systems was otherwise stable.    PAST MEDICAL HISTORY: Past Medical History:  Diagnosis Date  . Allergy   . Anxiety   . Breast cancer ([place])   . Chronic kidney disease    bladder issues  . Depression   . Family history of breast cancer   . Fibromyalgia   . Genetic testing 06/13/2017   Breast/GYN panel (23 genes) @ [facility] - No pathogenic mutations detected  . Headache    smokes marijuana for migraine pain  . Murmur, cardiac    as a child  . Personal history of chemotherapy 2018  . Stroke ([place]) 2000   numbness on left side  . TMJ (dislocation of temporomandibular joint)     PAST SURGICAL HISTORY: Past Surgical History:  Procedure Laterality Date  . BREAST BIOPSY Left 05/14/2017   malignant  . BREAST LUMPECTOMY WITH AXILLARY LYMPH NODE BIOPSY Left 11/13/2017   Procedure: LEFT BREAST LUMPECTOMY WITH SENTINEL LYMPH NODE BIOPSY;  Surgeon: Amy Bookbinder, MD;  Location: [facility];  Service: General;  Laterality: Left;  .  CESAREAN SECTION     2  . ENDOMETRIAL ABLATION  2011  . PORTACATH PLACEMENT Right 05/31/2017   Procedure: INSERTION PORT-A-CATH WITH [place];  Surgeon: Amy Bookbinder, MD;  Location: [facility];  Service: General;  Laterality: Right;    FAMILY HISTORY Family History  Problem Relation Age of Onset  . Liver cancer Sister        "rare type"; deceased 59  . Breast cancer Maternal  Aunt        Dx 34s; currently 50s  . Breast cancer Paternal Grandmother        Dx 33s; deceased 1s  . Cancer Paternal Aunt        unk. type; deceased 47  . Colon cancer Neg Hx   . Esophageal cancer Neg Hx   . Rectal cancer Neg Hx   . Stomach cancer Neg Hx   The patient's parents are in their mid 61s as of November 2018.  The patient had 1 brother, 1 sister.  The sister died from a rare liver cancer at age 3.  A maternal aunt and a paternal grandmother were diagnosed with breast cancer in their 20s.  GYNECOLOGIC HISTORY:  No LMP recorded. Patient has had an ablation. Menarche age 55.  She underwent endometrial ablation 2011.  She is no longer having periods.  She never used hormone replacement.  First live birth was age 48.  She is GX P2.   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  Her husband Amy Dawson works as an Firefighter for [employer]  Daughter Amy Dawson attends [employer].  Son Amy Dawson is doing on apprenticeship in the [employer] program.    ADVANCED DIRECTIVES: Not in place   HEALTH MAINTENANCE: Social History   Tobacco Use  . Smoking status: Former Research scientist (life sciences)  . Smokeless tobacco: Never Used  Substance Use Topics  . Alcohol use: Yes    Alcohol/week: 0.0 standard drinks    Comment: sometimes  . Drug use: Yes    Types: Marijuana    Comment: last smoked 11-10-17 ("for anxiety"), uses daily     Colonoscopy: 2016/[facility]  PAP: October 2018  Bone density: Never   Allergies  Allergen Reactions  . Other Other (See Comments)    Red wine & white cheese: migraine Raw egg whites- hives    Current Outpatient Medications  Medication Sig Dispense Refill  . amitriptyline (ELAVIL) 50 MG tablet Take 25 mg by mouth at bedtime as needed for sleep.    Marland Kitchen azelastine (OPTIVAR) 0.05 % ophthalmic solution INSTILL 1 DROP INTO EACH EYE AS NEEDED  1  . Calcium Glycerophosphate (PRELIEF PO) Take 2 tablets by mouth daily.     .  Cetirizine HCl (ZYRTEC ALLERGY PO) Take by mouth daily.    Marland Kitchen dimenhyDRINATE (DRAMAMINE) 50 MG tablet Take 50 mg every 8 (eight) hours as needed by mouth.    . fluticasone (FLONASE) 50 MCG/ACT nasal spray Place 2 sprays into both nostrils daily.    . Multiple Vitamin (MULTIVITAMIN) tablet Take 1 tablet by mouth daily.    Marland Kitchen OVER THE COUNTER MEDICATION as needed (excedrine - tension headache).    . OVER THE COUNTER MEDICATION as needed (Excedrine - pain reliever).    . Pseudoephedrine HCl (SUDAFED PO) Take by mouth as needed.    . Pseudoephedrine-guaiFENesin (MUCINEX D PO) Take by mouth as needed.     No current facility-administered medications for this visit.  OBJECTIVE: Middle-aged white woman who appears well  Vitals:   02/28/18 1201  BP: 115/86  Pulse: 85  Resp: 16  Temp: 98.2 F (36.8 C)  SpO2: 99%     Body mass index is 25.59 kg/m.   Wt Readings from Last 3 Encounters:  02/28/18 172 lb (78 kg)  02/19/18 174 lb 9.6 oz (79.2 kg)  01/21/18 178 lb 12.8 oz (81.1 kg)  ECOG FS:1 - Symptomatic but completely ambulatory   Sclerae unicteric, EOMs intact Oropharynx clear and moist No cervical or supraclavicular adenopathy Lungs no rales or rhonchi Heart regular rate and rhythm Abd soft, nontender, positive bowel sounds MSK no focal spinal tenderness, no upper extremity lymphedema Neuro: nonfocal, well oriented, appropriate affect Breasts: The right breast is unremarkable.  The left breast is status post lumpectomy.  There is no evidence of local recurrence.  Both axillae are benign. Adenopathy: The lymph node in the right medial thigh seems to me slightly smaller, from 2.0 down to 1.5 cm or so.  It remains rubbery, movable, and nontender.  There is no other palpable adenopathy in that area.   LAB RESULTS:  CMP     Component Value Date/Time   NA 137 02/19/2018 0908   NA 138 07/10/2017 1458   K 4.0 02/19/2018 0908   K 3.7 07/10/2017 1458   CL 99 02/19/2018 0908   CO2 28  02/19/2018 0908   CO2 25 07/10/2017 1458   GLUCOSE 125 (H) 02/19/2018 0908   GLUCOSE 95 07/10/2017 1458   BUN 10 02/19/2018 0908   BUN 7.4 07/10/2017 1458   CREATININE 0.86 02/19/2018 0908   CREATININE 0.7 07/10/2017 1458   CALCIUM 9.7 02/19/2018 0908   CALCIUM 8.9 07/10/2017 1458   PROT 7.7 02/19/2018 0908   PROT 7.1 07/10/2017 1458   ALBUMIN 4.0 02/19/2018 0908   ALBUMIN 3.8 07/10/2017 1458   AST 21 02/19/2018 0908   AST 10 07/10/2017 1458   ALT 18 02/19/2018 0908   ALT 11 07/10/2017 1458   ALKPHOS 101 02/19/2018 0908   ALKPHOS 92 07/10/2017 1458   BILITOT 0.3 02/19/2018 0908   BILITOT <0.22 07/10/2017 1458   GFRNONAA >60 02/19/2018 0908   GFRAA >60 02/19/2018 0908    No results found for: TOTALPROTELP, ALBUMINELP, A1GS, A2GS, BETS, BETA2SER, GAMS, MSPIKE, SPEI  No results found for: Nils Pyle, [facility]  Lab Results  Component Value Date   WBC 3.8 (L) 02/28/2018   NEUTROABS 2.4 02/28/2018   HGB 13.2 02/28/2018   HCT 39.4 02/28/2018   MCV 96.2 02/28/2018   PLT 238 02/28/2018      Chemistry      Component Value Date/Time   NA 137 02/19/2018 0908   NA 138 07/10/2017 1458   K 4.0 02/19/2018 0908   K 3.7 07/10/2017 1458   CL 99 02/19/2018 0908   CO2 28 02/19/2018 0908   CO2 25 07/10/2017 1458   BUN 10 02/19/2018 0908   BUN 7.4 07/10/2017 1458   CREATININE 0.86 02/19/2018 0908   CREATININE 0.7 07/10/2017 1458      Component Value Date/Time   CALCIUM 9.7 02/19/2018 0908   CALCIUM 8.9 07/10/2017 1458   ALKPHOS 101 02/19/2018 0908   ALKPHOS 92 07/10/2017 1458   AST 21 02/19/2018 0908   AST 10 07/10/2017 1458   ALT 18 02/19/2018 0908   ALT 11 07/10/2017 1458   BILITOT 0.3 02/19/2018 0908   BILITOT <0.22 07/10/2017 1458       No results found for:  LABCA2  No components found for: [id number]  No results for input(s): INR in the last 168 hours.  No results found for: LABCA2  No results found for: RCV893  No results found for:  YBO175  No results found for: ZWC585  No results found for: CA2729  No components found for: HGQUANT  No results found for: CEA1 / No results found for: CEA1   No results found for: AFPTUMOR  No results found for: CHROMOGRNA  No results found for: PSA1  Appointment on 02/28/2018  Component Date Value Ref Range Status  . WBC Count 02/28/2018 3.8* 3.9 - 10.3 K/uL Final  . RBC 02/28/2018 4.10  3.70 - 5.45 MIL/uL Final  . Hemoglobin 02/28/2018 13.2  11.6 - 15.9 g/dL Final  . HCT 02/28/2018 39.4  34.8 - 46.6 % Final  . MCV 02/28/2018 96.2  79.5 - 101.0 fL Final  . MCH 02/28/2018 32.3  25.1 - 34.0 pg Final  . MCHC 02/28/2018 33.6  31.5 - 36.0 g/dL Final  . RDW 02/28/2018 14.6* 11.2 - 14.5 % Final  . Platelet Count 02/28/2018 238  145 - 400 K/uL Final  . Neutrophils Relative % 02/28/2018 63  % Final  . Neutro Abs 02/28/2018 2.4  1.5 - 6.5 K/uL Final  . Lymphocytes Relative 02/28/2018 23  % Final  . Lymphs Abs 02/28/2018 0.9  0.9 - 3.3 K/uL Final  . Monocytes Relative 02/28/2018 11  % Final  . Monocytes Absolute 02/28/2018 0.4  0.1 - 0.9 K/uL Final  . Eosinophils Relative 02/28/2018 2  % Final  . Eosinophils Absolute 02/28/2018 0.1  0.0 - 0.5 K/uL Final  . Basophils Relative 02/28/2018 1  % Final  . Basophils Absolute 02/28/2018 0.0  0.0 - 0.1 K/uL Final   Performed at [facility], [address] [place], [place] [address]    (this displays the last labs from the last 3 days)  No results found for: TOTALPROTELP, ALBUMINELP, A1GS, A2GS, BETS, BETA2SER, GAMS, MSPIKE, SPEI (this displays SPEP labs)  No results found for: KPAFRELGTCHN, LAMBDASER, KAPLAMBRATIO (kappa/lambda light chains)  No results found for: HGBA, HGBA2QUANT, HGBFQUANT, HGBSQUAN (Hemoglobinopathy evaluation)   No results found for: LDH  No results found for: IRON, TIBC, IRONPCTSAT (Iron and TIBC)  No results found for: FERRITIN  Urinalysis    Component Value Date/Time    COLORURINE YELLOW 07/07/2017 1400   APPEARANCEUR CLEAR 07/07/2017 1400   LABSPEC 1.003 (L) 07/07/2017 1400   LABSPEC 1.010 05/30/2017 1315   PHURINE 7.0 07/07/2017 1400   GLUCOSEU NEGATIVE 07/07/2017 1400   GLUCOSEU Negative 05/30/2017 1315   HGBUR NEGATIVE 07/07/2017 1400   BILIRUBINUR NEGATIVE 07/07/2017 1400   BILIRUBINUR Color Interference 05/30/2017 1315   KETONESUR NEGATIVE 07/07/2017 1400   PROTEINUR NEGATIVE 07/07/2017 1400   UROBILINOGEN Color Interference 05/30/2017 1315   NITRITE NEGATIVE 07/07/2017 1400   LEUKOCYTESUR NEGATIVE 07/07/2017 1400   LEUKOCYTESUR Color Interference 05/30/2017 1315     STUDIES: No results found.  ELIGIBLE FOR AVAILABLE RESEARCH PROTOCOL: SWOG 4235 depending on response to neoadjuvant treatment, UPBEAT  ASSESSMENT: 52 y.o. [place] status post left breast upper outer quadrant biopsy May 14, 2017 for a clinical T2 N0, stage IIB invasive ductal carcinoma, grade 3, triple negative, with an MIB-1 of 80%  (1) neoadjuvant chemotherapy consisting of doxorubicin and cyclophosphamide in dose dense fashion x4 started 06/04/2017, completed 07/17/2017; followed by weekly carboplatin and paclitaxel x12 started 07/30/2017, completed 10/22/2017  (a) echo on 05/30/2017: LVEF 55-60%  (2)  Right lumpectomy and SLNB on 11/13/2017: showed ypT2 ypN0 residual invasive ductal carcinoma, grade 3, repeat prognostic panel again triple negative.  RCB-II  (3) adjuvant radiation with capecitabine sensitizaton  12/16/2017-01/31/2018, she did experience planter erythrodysesthesia with the Capecitabine, so it was not continued for six months after sensitization   (4) genetics testing offered through [facility] 08/13/2016 showed: No deleterious mutations in ATM, BARD1, BRCA1, BRCA2, BRIP1, CDH1, CHEK2, DICER1, EPCAM*, MLH1, MSH2, MSH6, NBN, NF1, PALB2, PMS2, PTEN, RAD50, RAD51C, RAD51D, SMARCA4, STK11, TP53  (5) participating in [place] S1418, randomized to ARM 2= pembrolizumab Q  21 days, started 02/28/2018  PLAN: Jadine is ready to start her pembrolizumab treatments today.  She has recovered very nicely from her earlier treatments, and has an excellent functional status as noted above.  Today we again reviewed the way pembrolizumab works.  She understands what the most common side effects are but really she could have absolutely any side effect and the important thing is for her to communicate with [place] and let us know if anything develops.  For the first few cycles I would like to see her to make sure nothing untoward develops.  We are also going to be checking her TSH on a regular basis.  I am also following the lymph node in the right medial thigh.  I think this is going to be reactive.  She certainly has significant problems from her yeast infection.  We will continue to follow this closely.  If it grow since that are shrinking or if it fails to completely disappear after another few weeks we will consider a biopsy  She knows to call for any other issues that may develop before the next visit.      Dawson, Amy Dad, MD  02/28/18 12:10 PM Medical Oncology and Hematology [facility] [address] [place], [place] [address] Tel. [phone]    Fax. [phone]  Alice Rieger, am acting as scribe for Chauncey Cruel MD.  I, Lurline Del MD, have reviewed the above documentation for accuracy and completeness, and I agree with the above.

## 2018-02-28 ENCOUNTER — Encounter: Payer: Self-pay | Admitting: *Deleted

## 2018-02-28 ENCOUNTER — Inpatient Hospital Stay: Payer: 59

## 2018-02-28 ENCOUNTER — Inpatient Hospital Stay (HOSPITAL_BASED_OUTPATIENT_CLINIC_OR_DEPARTMENT_OTHER): Payer: 59 | Admitting: Oncology

## 2018-02-28 ENCOUNTER — Telehealth: Payer: Self-pay | Admitting: Oncology

## 2018-02-28 VITALS — BP 115/86 | HR 85 | Temp 98.2°F | Resp 16 | Ht 68.75 in | Wt 172.0 lb

## 2018-02-28 DIAGNOSIS — Z171 Estrogen receptor negative status [ER-]: Secondary | ICD-10-CM

## 2018-02-28 DIAGNOSIS — B373 Candidiasis of vulva and vagina: Secondary | ICD-10-CM | POA: Diagnosis not present

## 2018-02-28 DIAGNOSIS — C50412 Malignant neoplasm of upper-outer quadrant of left female breast: Secondary | ICD-10-CM

## 2018-02-28 DIAGNOSIS — R590 Localized enlarged lymph nodes: Secondary | ICD-10-CM | POA: Diagnosis not present

## 2018-02-28 DIAGNOSIS — Z95828 Presence of other vascular implants and grafts: Secondary | ICD-10-CM

## 2018-02-28 DIAGNOSIS — Z006 Encounter for examination for normal comparison and control in clinical research program: Secondary | ICD-10-CM | POA: Diagnosis not present

## 2018-02-28 DIAGNOSIS — Z79899 Other long term (current) drug therapy: Secondary | ICD-10-CM

## 2018-02-28 DIAGNOSIS — N6310 Unspecified lump in the right breast, unspecified quadrant: Secondary | ICD-10-CM

## 2018-02-28 DIAGNOSIS — Z923 Personal history of irradiation: Secondary | ICD-10-CM

## 2018-02-28 DIAGNOSIS — Z9221 Personal history of antineoplastic chemotherapy: Secondary | ICD-10-CM

## 2018-02-28 LAB — CBC WITH DIFFERENTIAL (CANCER CENTER ONLY)
BASOS PCT: 1 %
Basophils Absolute: 0 10*3/uL (ref 0.0–0.1)
Eosinophils Absolute: 0.1 10*3/uL (ref 0.0–0.5)
Eosinophils Relative: 2 %
HCT: 39.4 % (ref 34.8–46.6)
HEMOGLOBIN: 13.2 g/dL (ref 11.6–15.9)
Lymphocytes Relative: 23 %
Lymphs Abs: 0.9 10*3/uL (ref 0.9–3.3)
MCH: 32.3 pg (ref 25.1–34.0)
MCHC: 33.6 g/dL (ref 31.5–36.0)
MCV: 96.2 fL (ref 79.5–101.0)
MONO ABS: 0.4 10*3/uL (ref 0.1–0.9)
MONOS PCT: 11 %
NEUTROS PCT: 63 %
Neutro Abs: 2.4 10*3/uL (ref 1.5–6.5)
Platelet Count: 238 10*3/uL (ref 145–400)
RBC: 4.1 MIL/uL (ref 3.70–5.45)
RDW: 14.6 % — AB (ref 11.2–14.5)
WBC Count: 3.8 10*3/uL — ABNORMAL LOW (ref 3.9–10.3)

## 2018-02-28 LAB — CMP (CANCER CENTER ONLY)
ALK PHOS: 96 U/L (ref 38–126)
ALT: 14 U/L (ref 0–44)
AST: 20 U/L (ref 15–41)
Albumin: 3.9 g/dL (ref 3.5–5.0)
Anion gap: 8 (ref 5–15)
BUN: 7 mg/dL (ref 6–20)
CALCIUM: 9.3 mg/dL (ref 8.9–10.3)
CO2: 29 mmol/L (ref 22–32)
CREATININE: 0.75 mg/dL (ref 0.44–1.00)
Chloride: 101 mmol/L (ref 98–111)
GFR, Estimated: >60 mL/min (ref >60)
Glucose, Bld: 94 mg/dL (ref 70–99)
Potassium: 3.8 mmol/L (ref 3.5–5.1)
SODIUM: 138 mmol/L (ref 135–145)
TOTAL PROTEIN: 7.3 g/dL (ref 6.5–8.1)
Total Bilirubin: 0.3 mg/dL (ref 0.3–1.2)

## 2018-02-28 MED ORDER — SODIUM CHLORIDE 0.9 % IV SOLN
200.0000 mg | Freq: Once | INTRAVENOUS | Status: AC
  Administered 2018-02-28: 200 mg via INTRAVENOUS
  Filled 2018-02-28: qty 8

## 2018-02-28 MED ORDER — HEPARIN SOD (PORK) LOCK FLUSH 100 UNIT/ML IV SOLN
500.0000 [IU] | Freq: Once | INTRAVENOUS | Status: AC | PRN
  Administered 2018-02-28: 500 [IU]
  Filled 2018-02-28: qty 5

## 2018-02-28 MED ORDER — SODIUM CHLORIDE 0.9% FLUSH
10.0000 mL | INTRAVENOUS | Status: DC | PRN
  Administered 2018-02-28: 10 mL
  Filled 2018-02-28: qty 10

## 2018-02-28 MED ORDER — SODIUM CHLORIDE 0.9 % IV SOLN
Freq: Once | INTRAVENOUS | Status: AC
  Administered 2018-02-28: 14:00:00 via INTRAVENOUS
  Filled 2018-02-28: qty 250

## 2018-02-28 MED ORDER — SODIUM CHLORIDE 0.9% FLUSH
10.0000 mL | Freq: Once | INTRAVENOUS | Status: AC
  Administered 2018-02-28: 10 mL
  Filled 2018-02-28: qty 10

## 2018-02-28 NOTE — Telephone Encounter (Signed)
Per 8/16 los.  Gave patient first two appt dates and times, as printer down.  She will get calendar next time she is in for an appt.

## 2018-02-28 NOTE — Patient Instructions (Signed)
Pembrolizumab injection  What is this medicine?  PEMBROLIZUMAB (pem broe liz ue mab) is a monoclonal antibody. It is used to treat melanoma, head and neck cancer, Hodgkin lymphoma, non-small cell lung cancer, urothelial cancer, stomach cancer, and cancers that have a certain genetic condition.  This medicine may be used for other purposes; ask your health care provider or pharmacist if you have questions.  COMMON BRAND NAME(S): Keytruda  What should I tell my health care provider before I take this medicine?  They need to know if you have any of these conditions:  -diabetes  -immune system problems  -inflammatory bowel disease  -liver disease  -lung or breathing disease  -lupus  -organ transplant  -an unusual or allergic reaction to pembrolizumab, other medicines, foods, dyes, or preservatives  -pregnant or trying to get pregnant  -breast-feeding  How should I use this medicine?  This medicine is for infusion into a vein. It is given by a health care professional in a hospital or clinic setting.  A special MedGuide will be given to you before each treatment. Be sure to read this information carefully each time.  Talk to your pediatrician regarding the use of this medicine in children. While this drug may be prescribed for selected conditions, precautions do apply.  Overdosage: If you think you have taken too much of this medicine contact a poison control center or emergency room at once.  NOTE: This medicine is only for you. Do not share this medicine with others.  What if I miss a dose?  It is important not to miss your dose. Call your doctor or health care professional if you are unable to keep an appointment.  What may interact with this medicine?  Interactions have not been studied.  Give your health care provider a list of all the medicines, herbs, non-prescription drugs, or dietary supplements you use. Also tell them if you smoke, drink alcohol, or use illegal drugs. Some items may interact with your  medicine.  This list may not describe all possible interactions. Give your health care provider a list of all the medicines, herbs, non-prescription drugs, or dietary supplements you use. Also tell them if you smoke, drink alcohol, or use illegal drugs. Some items may interact with your medicine.  What should I watch for while using this medicine?  Your condition will be monitored carefully while you are receiving this medicine.  You may need blood work done while you are taking this medicine.  Do not become pregnant while taking this medicine or for 4 months after stopping it. Women should inform their doctor if they wish to become pregnant or think they might be pregnant. There is a potential for serious side effects to an unborn child. Talk to your health care professional or pharmacist for more information. Do not breast-feed an infant while taking this medicine or for 4 months after the last dose.  What side effects may I notice from receiving this medicine?  Side effects that you should report to your doctor or health care professional as soon as possible:  -allergic reactions like skin rash, itching or hives, swelling of the face, lips, or tongue  -bloody or black, tarry  -breathing problems  -changes in vision  -chest pain  -chills  -constipation  -cough  -dizziness or feeling faint or lightheaded  -fast or irregular heartbeat  -fever  -flushing  -hair loss  -low blood counts - this medicine may decrease the number of white blood cells, red blood cells   and platelets. You may be at increased risk for infections and bleeding.  -muscle pain  -muscle weakness  -persistent headache  -signs and symptoms of high blood sugar such as dizziness; dry mouth; dry skin; fruity breath; nausea; stomach pain; increased hunger or thirst; increased urination  -signs and symptoms of kidney injury like trouble passing urine or change in the amount of urine  -signs and symptoms of liver injury like dark urine, light-colored  stools, loss of appetite, nausea, right upper belly pain, yellowing of the eyes or skin  -stomach pain  -sweating  -weight loss  Side effects that usually do not require medical attention (report to your doctor or health care professional if they continue or are bothersome):  -decreased appetite  -diarrhea  -tiredness  This list may not describe all possible side effects. Call your doctor for medical advice about side effects. You may report side effects to FDA at 1-800-FDA-1088.  Where should I keep my medicine?  This drug is given in a hospital or clinic and will not be stored at home.  NOTE: This sheet is a summary. It may not cover all possible information. If you have questions about this medicine, talk to your doctor, pharmacist, or health care provider.   2018 Elsevier/Gold Standard (2016-04-10 12:29:36)

## 2018-02-28 NOTE — Progress Notes (Addendum)
[id number]:  Cycle 1, Day 1 Treatment Arm  02/28/18 Patient arrives today by herself for her Cycle 1 Day 1 treatment visit.  This RN met with patient Amy Dawson in the lobby and this RN explained that she would have lab work completed, then see Dr. [name] where her medical history and medications would be reviewed and then proceed to planned infusion.  The patient denies any questions or concerns about the study at this time.  The following procedures were completed throughout the visit today:  Labs:  This RN confirmed with patient that she does have her portacath still in place.  Patient's port will be accessed and used today for blood draw and infusion.  The patient's study pre-treatment lab from baseline is over three days old, so pre-treatment required lab work is repeated today.  The patient had a TSH ordered by Dr. [name].  This RN clarified that the patient did not need a repeat TSH today (last completed 02/19/18), but is to remain as a standing order so TSH can be periodically checked during her treatment as needed and as indicated by the protocol.  Dr. [name] reviewed the patient's lab results prior to treatment today.  Vital signs (including height and weight):  Vitals were collected per routine, see patient's encounter from today with Dr. [name] for documentation.  History/Physical/Performance Status/Toxicity Assessment:  Completed by Dr. [name], see patient's encounter from today with him for documentation.  Patient is to proceed with treatment today, per Dr. [name], per protocol.  This RN reviewed the patient's current status, medical history and concomitant medications with the patient.  Details have been added to the patient's medical history in Epic.  New additions are noted below, see medical history list and medication list in Epic for clarification of start dates on information already listed:  Headaches updated to note that the patient experiences both migraine headaches and  tension headaches.   One addition to the patient's concomitant medications list is an OTC medication "Prelief."  The patient takes 2 tabs PO PRN for her "bladder issues."  Patient describes her "bladder issues" as occasionally she will experience burning with urination if she consumes certain foods or beverages, such as caffeine or spicy foods.  Drinking a lot of water "at least 64oz" and the Prelief will relieve this symptom as well.  One addition to the patient's medical history is nausea.  The patient occasionally will feel nauseated, does not correspond with an illness, very intermittent "once every few months," and she remembers feeling this sensation back to childhood at least at age 88.    This is not debilitating in anyway, she will sometimes take dramamine to treat with resolution.  Dramamine was already on patient's medication list.  Since her last visit, the patient has begun to notice vaginal discharge and treated herself with OTC monistat for this yeast infection.  Vaginal discharge is the only symptom of a yeast infection she is experiencing and she first noticed this on Friday, Feb 21, 2018.  This is ongoing.  The patient took Monistat 1 (1273m miconazole nitrate, vaginal suppository, once) on Sunday, Feb 23, 2018.    She continued to experience symptoms and so took Monistat 3 (2017mmiconazole nitrate, vaginal suppository, once daily x 3 days) Monday-Wednesday (Aug 12-14,2019).  This RN confirmed with Dr. MaJana Hakimhis is local therapy, not systemic therapy, for an active infection.  Patient is not requiring systemic therapy at this time.  Neither monistat therapy have been added to her medication  list in Epic as she has finished treatment with each of these.  Lab Results from 02/28/18 show that the patient's white blood cell count is decreased, just below lower limit of normal.  This represents a Grade 1 AE, is not clinically significant.  See Dr. [name] documentation for his  assessment of lymph node at the patient's thigh.  Baseline Medical History Summary: Baseline  Grade (CTCAE v4.0) Relatedness Comments  Respiratory, thoracic and mediastinal disorders - Other, Specify: Allergies 1 NA; prior to study initiation Medical History; takes daily and PRN medications to manage symptoms, does not limit ADLs.  Anxiety 1 NA; prior to study initiation Medical History; takes daily medication to manage and is well controlled, does not limit ADLs  Renal and urinary disorders - Other, specify: dysuria 1 NA; prior to study initiation Medical History; ensures daily adequate water and PRN medication to manage mild symptom of "bladder issues," does not limit ADLs.  Depression 1 NA; prior to study initiation Medical History; takes daily medication to manage and is well controlled, does not limit ADLs  Nervous system disorders - Other, specify: fibromyalgia 1 NA; prior to study initiation Medical History; only requires PRN medication for joint pain during "flare ups."   Headache 1 NA; prior to study initiation Medical History of occassional tension and migraine headaches; takes only OTC PRN medication.  Nausea 1 NA; prior to study initiation Medical History of occasional nausea; only OTC PRN medication.  Stroke 1 NA; prior to study initiation Medical History; residual mild symptom of partial left sided "numbness" but does not limit ADLs or use of left side.  White blood cell decreased 1 NA; prior to study initiation Start date 02/28/18; unresolved; <LLN at 3.8K/uL.  Vaginal infection 2 NA; prior to study initiation Start date 02/21/18; unresolved; treated with topical, local antifungal   Infusion:  This RN met with Infusion RN Seth Bake prior to patient's infusion appointment and explained protocol treatment and provided this RN's contact information and [id number] infusion sign.  Questions answered.  Infusion RN to complete chemotherapy consent, as this is typically completed by infusion team and  not research team.  Just prior to infusion this RN learned that patient had been reassigned to infusion nurse Amelia Jo, RN.  Seth Bake confirmed giving report to Infusion RN Erline Levine.  This RN met with Infusions RN Tim, who was monitoring Infusion RN Khira Cudmore's patient while she was out of the infusion room, and updated him on protocol infusion and ensured contact information.  Patient had not yet initiated treatment; patient was sleeping comfortably in infusion chair.  No questions or concerns at this time from infusion RNs.   This RN returned to infusion room to check in following start of infusion.  Infusion RN Erline Levine had Research RN contact information and was aware of study requirements which were again briefly reviewed.  No questions or concerns at this time.    This RN discussed with patient that if she were to experience any side effects, should follow the same process she did when she was receiving chemotherapy with seeking care.  This RN reinforced that she should seek emergency care if required and not delay care if she had not yet been in touch with a member of the research team and to notify emergency provider that she is on a clinical trial with pembrolizumab treatment.  This was acknowledged by the patient.  The patient is tolerating the infusion well at the time of this RN meeting with patient (approximately 3:45). The patient  denies any questions or concerns at this time.  The patient's next study visits have already been scheduled.  The patient was thanked for her ongoing participation in the study.  This RN met with patient and Infusion RN Erline Levine at end of infusion.  Patient has no complaints at this time and tolerated infusion well.  Doreatha Martin, RN, BSN, [facility] 02/28/2018 4:32 PM

## 2018-02-28 NOTE — Addendum Note (Signed)
Addended by: Chauncey Cruel on: 02/28/2018 02:24 PM   Modules accepted: Orders

## 2018-03-11 ENCOUNTER — Other Ambulatory Visit: Payer: Self-pay | Admitting: Adult Health

## 2018-03-11 ENCOUNTER — Encounter: Payer: Self-pay | Admitting: Radiation Oncology

## 2018-03-11 ENCOUNTER — Other Ambulatory Visit: Payer: Self-pay

## 2018-03-11 ENCOUNTER — Ambulatory Visit
Admission: RE | Admit: 2018-03-11 | Discharge: 2018-03-11 | Disposition: A | Discharge status: Home / Self Care | Payer: 59 | Source: Ambulatory Visit | Attending: Radiation Oncology | Admitting: Radiation Oncology

## 2018-03-11 VITALS — BP 128/73 | HR 92 | Temp 98.3°F | Resp 20 | Wt 174.6 lb

## 2018-03-11 DIAGNOSIS — Z171 Estrogen receptor negative status [ER-]: Principal | ICD-10-CM

## 2018-03-11 DIAGNOSIS — M25551 Pain in right hip: Secondary | ICD-10-CM | POA: Diagnosis not present

## 2018-03-11 DIAGNOSIS — C50412 Malignant neoplasm of upper-outer quadrant of left female breast: Secondary | ICD-10-CM | POA: Diagnosis not present

## 2018-03-11 DIAGNOSIS — Z79899 Other long term (current) drug therapy: Secondary | ICD-10-CM | POA: Diagnosis not present

## 2018-03-11 DIAGNOSIS — Z923 Personal history of irradiation: Secondary | ICD-10-CM | POA: Insufficient documentation

## 2018-03-11 DIAGNOSIS — M7989 Other specified soft tissue disorders: Secondary | ICD-10-CM

## 2018-03-11 NOTE — Progress Notes (Signed)
Radiation Oncology         [phone] ________________________________  Name: Amy Dawson MRN: [id number]  Date of Service: 03/11/2018  DOB: 09/14/1965  Post Treatment Note  CC: [name], MD  [name], [name], MD  Diagnosis:   Stage IIB, pT2N0M0 grade 2-3 triple negative invasive ductal carcinoma of the left breast     Interval Since Last Radiation:  6 weeks   12/16/2017 - 01/31/2018: The patient initially received a dose of 50.4 Gy in 28 fractions to the left breast using whole-breast tangent fields. This was delivered using a 3-D conformal technique. The patient then received a boost to the seroma. This delivered an additional 10 Gy in 5 fractions using 12E electrons. The total dose was 60.4 Gy.  Narrative:  The patient returns today for routine follow-up. During treatment she did very well with radiotherapy and did not have significant desquamation.                             On review of systems, the patient states she's doing okay but is having pain in her right hip/buttock. She also has a right inguinal lesion that has been followed. She is having pain in the right hip/buttock and will see Dr. [name] for this today. She continues on clinical trial as well. No other complaints are noted.  ALLERGIES:  is allergic to other.  Meds: Current Outpatient Medications  Medication Sig Dispense Refill  . amitriptyline (ELAVIL) 50 MG tablet Take 25 mg by mouth at bedtime as needed for sleep. Patient taking differently; take 50 mg daily.    Marland Kitchen azelastine (OPTIVAR) 0.05 % ophthalmic solution INSTILL 1 DROP INTO EACH EYE AS NEEDED  1  . Calcium Glycerophosphate (PRELIEF PO) Take 2 tablets by mouth as needed (dysuria).     . CALCIUM PO Take 1 tablet by mouth daily.    . Cetirizine HCl (ZYRTEC ALLERGY PO) Take 10 mg by mouth daily.     Marland Kitchen dimenhyDRINATE (DRAMAMINE) 50 MG tablet Take 50 mg by mouth every 8 (eight) hours as needed for nausea.     . fluticasone (FLONASE) 50 MCG/ACT  nasal spray Place 2 sprays into both nostrils daily.    . Multiple Vitamin (MULTIVITAMIN) tablet Take 1 tablet by mouth daily.    Marland Kitchen OVER THE COUNTER MEDICATION Take 2 tablets by mouth as needed (excedrine - tension headache; for headache).     . OVER THE COUNTER MEDICATION Take 2 tablets by mouth as needed (Excedrine - pain reliever; for joint pain associated with fibromyalgia (acetaminophen 250mg +Asprin 250mg +caffeine 65mg )).     . Pseudoephedrine HCl (SUDAFED PO) Take 1 tablet by mouth as needed (psuedoephedrine HCl 60mg ).     . Pseudoephedrine-guaiFENesin (MUCINEX D PO) Take 1 tablet by mouth as needed (guaifenesin 1200mg +pseudoephedrine HCl 120mg ).      No current facility-administered medications for this encounter.     Physical Findings:  weight is 174 lb 9.6 oz (79.2 kg). Her oral temperature is 98.3 F (36.8 C). Her blood pressure is 128/73 and her pulse is 92. Her respiration is 20 and oxygen saturation is 99%.  Pain Assessment Pain Score: 0-No pain/10 In general this is a well appearing Caucasian female in no acute distress. She's alert and oriented x4 and appropriate throughout the examination. Cardiopulmonary assessment is negative for acute distress and she exhibits normal effort. The left breast was examined and reveals mild hyperpigmentation in the upper outer chest.  No desquamation is noted. She has fullness deep to the ischial/iliac region of her right hip on exam as well as a prominent fullness in the right groin. Neither of these sites break the skin.  Lab Findings: Lab Results  Component Value Date   WBC 3.8 (L) 02/28/2018   HGB 13.2 02/28/2018   HCT 39.4 02/28/2018   MCV 96.2 02/28/2018   PLT 238 02/28/2018     Radiographic Findings: No results found.  Impression/Plan: 1. Stage IIB, pT2N0M0 grade 2-3 triple negative invasive ductal carcinoma of the left breast . The patient has been doing well since completion of radiotherapy. We discussed that we would be happy  to continue to follow her as needed, but she will also continue to follow up with Dr. [name] in medical oncology. She was counseled on skin care as well as measures to avoid sun exposure to this area.  2. Right hip pain, fullness along ilium/ishium and right groin. I recommended that she follow up with Dr. [name] today and if this is found to be disease related metastasis we would consider offering radiotherapy. I will follow up with his evaluation of this tomorrow.    Carola Rhine, PAC

## 2018-03-11 NOTE — Progress Notes (Signed)
S: Saw Landra Howze briefly today due to a soft tissue mass located in her right buttock.  Symptoms were preceded by pain in her right hip area x 2-3 days, then a soft mass appeared in her right buttock.  She says that her hip is aching now, and she is concerned about this new development.    O: I examined her right buttock and found a 5cm soft tissue mass that is well circumscribed.  It appears to be deeper than a subcutaneous lesion.  It is soft.  It is non tender to touch.    A/P: This mass is atypical for a lipoma and needs further imaging to discern what is going on. After discussion with Dr. [name], and Dr. Craige Cotta (in radiology), I ordered an MRI of the pelvis with special attention to the soft tissue mass in right buttock.  I called and left a message with this plan on Lori's voice mail at her request.     Wilber Bihari, NP

## 2018-03-13 ENCOUNTER — Telehealth: Payer: Self-pay | Admitting: *Deleted

## 2018-03-13 NOTE — Telephone Encounter (Signed)
This RN spoke with pt per call regarding " I have spit up blood clots 2 times over the past 2 days ".  Amy Dawson states she was leaving the office this past Tuesday and " got a little choked drinking some water and had a blood in my sputum "  She then noted an episode yesterday of same occurrence.  She has noticed some blood tinge mucous she she blows her nose.  Other wise no other bleeding noted.  Amy Dawson states she completed the xeloda mid July ( used with radiation ).  Above discussed as likely secondary to recent temperature change ( decreased humidity ).  Amy Dawson will monitor and call if symptoms recur.  This note will be forwarded to MD for review and any further recommendations.

## 2018-03-20 ENCOUNTER — Ambulatory Visit (HOSPITAL_COMMUNITY)
Admission: RE | Admit: 2018-03-20 | Discharge: 2018-03-20 | Disposition: A | Discharge status: Home / Self Care | Payer: 59 | Source: Ambulatory Visit | Attending: Adult Health | Admitting: Adult Health

## 2018-03-20 DIAGNOSIS — L989 Disorder of the skin and subcutaneous tissue, unspecified: Secondary | ICD-10-CM | POA: Insufficient documentation

## 2018-03-20 DIAGNOSIS — C50412 Malignant neoplasm of upper-outer quadrant of left female breast: Secondary | ICD-10-CM | POA: Insufficient documentation

## 2018-03-20 DIAGNOSIS — M899 Disorder of bone, unspecified: Secondary | ICD-10-CM | POA: Insufficient documentation

## 2018-03-20 DIAGNOSIS — Z171 Estrogen receptor negative status [ER-]: Secondary | ICD-10-CM

## 2018-03-20 DIAGNOSIS — M7989 Other specified soft tissue disorders: Secondary | ICD-10-CM

## 2018-03-20 MED ORDER — GADOBENATE DIMEGLUMINE 529 MG/ML IV SOLN
20.0000 mL | Freq: Once | INTRAVENOUS | Status: AC | PRN
  Administered 2018-03-20: 16 mL via INTRAVENOUS

## 2018-03-20 NOTE — Progress Notes (Signed)
[place]  [phone] [phone]     ID: Amy Dawson DOB: 03/29/1966  MR#: [id number]  SJG#:[id number]  Patient Care Team: [name], MD as PCP - General (Family Medicine) [name], [name], MD as Consulting Physician (Oncology) [name], MD as Consulting Physician (General Surgery) [name], MD as Consulting Physician (Radiation Oncology) [name], NP as Nurse Practitioner (Obstetrics and Gynecology) OTHER MD:  CHIEF COMPLAINT: Triple negative breast cancer  CURRENT TREATMENT: pembrolizumab  HISTORY OF CURRENT ILLNESS: From the original intake note:  Amy Dawson felt some discomfort in the left breast in June 2018, but did not think much of it.  However in October she palpated a change in the upper outer quadrant of her left breast and she brought this to medical attention.  On May 09, 2017 she had bilateral diagnostic mammography with tomography at the breast center, showing the breast density to be category capital C.  In the left breast that was not obscured mass in the upper outer quadrant, which by palpation was identified at the 1230 o'clock radiant 5 cm from the nipple.  By ultrasound this measured 2.9 cm, and it was irregular and hypoechoic.  The left axilla was sonographically benign.  Biopsy of the left breast mass in question May 14, 2017 showed [phone]) and invasive ductal carcinoma, grade 2 or more likely 3, estrogen and progesterone receptor negative, with no HER-2 amplification, the signals ratio being 1.18 and the number per cell 1.65.  The key 67 was 80%.  The patient's subsequent history is as detailed below.  INTERVAL HISTORY: Amy Dawson returns today for follow-up and treatment of her triple negative breast cancer. She is participating in our pembrolizumab SWOG [phone] trial. Today is day 22 cycle 1--day 1 cycle 2.. She tolerates this well.   MRI pelvis on 03/20/2018 showed: Multiple foci of abnormal  marrow signal and enhancement consistent with metastatic disease. Enhancing subcutaneous lesions in the region of concern in the right buttock and medial aspect of the right thigh are also most consistent with metastatic disease.   REVIEW OF SYSTEMS: Amy Dawson reports that she feels a lump in her buttock. She has pain at varying time in the shoulders and ribs. She also feels a lump in te upper inner quadrant of the right breast. She notes that at night, she has blurry vision that looks like a "backwards C" and is sparkling. She denies unusual headaches, nausea, vomiting, or dizziness. There has been no unusual cough, phlegm production, or pleurisy. There has been no change in bowel or bladder habits. She denies unexplained fatigue or unexplained weight loss, bleeding, rash, or fever. A detailed review of systems was otherwise stable.    PAST MEDICAL HISTORY: Past Medical History:  Diagnosis Date  . Allergy 1993   Per patient 02/28/18: Diagnosed approximately 1993.  Marland Kitchen Anxiety 1995   Per patient 02/28/18: Diagnosed approximately 1995  . Breast cancer ([place])   . Chronic kidney disease 2019   bladder issues; Per patient 02/28/18, experiences burning with urination with certain food and beverages (caffeine, spicy foods, etc.).  Experienced for several years until on Keto diet, then symptoms reccured after stopping keto diet in Feb 2019  . Depression 2002   Per patient 02/28/18: diagnosed approximately 2002, does not have bipolar depression.  . Family history of breast cancer   . Fibromyalgia 2002   Per patient 02/28/18: diagnosed approximately 2002  . Genetic testing 06/13/2017   Breast/GYN panel (23 genes) @ [facility] - No pathogenic  mutations detected  . Headache 1993   smokes marijuana for migraine pain; Per patient 02/28/18:began in approximately 1993, experiences tension headaches and migraine headaches  . Murmur, cardiac 1978   as a child; Per patient 02/28/18: diagnosed approximately 1978  . Nausea  1978   Per patient 02/28/18: experiences occassional nausea since about age 59  . Personal history of chemotherapy 2018  . Stroke [facility]) 2000   numbness on left side; Per patient 02/28/18: partial numbness but retains full use of left side, is not limited  . TMJ (dislocation of temporomandibular joint) 1995   Per patient 02/28/18: diagnosed in approximately 1995    PAST SURGICAL HISTORY: Past Surgical History:  Procedure Laterality Date  . BREAST BIOPSY Left 05/14/2017   malignant  . BREAST LUMPECTOMY WITH AXILLARY LYMPH NODE BIOPSY Left 11/13/2017   Procedure: LEFT BREAST LUMPECTOMY WITH SENTINEL LYMPH NODE BIOPSY;  Surgeon: [name], MD;  Location: [facility];  Service: General;  Laterality: Left;  . CESAREAN SECTION     2  . ENDOMETRIAL ABLATION  2011  . PORTACATH PLACEMENT Right 05/31/2017   Procedure: INSERTION PORT-A-CATH WITH [place];  Surgeon: [name], MD;  Location: [facility];  Service: General;  Laterality: Right;    FAMILY HISTORY Family History  Problem Relation Age of Onset  . Liver cancer Sister        "rare type"; deceased 55  . Breast cancer Maternal Aunt        Dx 37s; currently 68s  . Breast cancer Paternal Grandmother        Dx 38s; deceased 59s  . Cancer Paternal Aunt        unk. type; deceased 22  . Colon cancer Neg Hx   . Esophageal cancer Neg Hx   . Rectal cancer Neg Hx   . Stomach cancer Neg Hx   The patient's parents are in their mid 32s as of November 2018.  The patient had 1 brother, 1 sister.  The sister died from a rare liver cancer at age 14.  A maternal aunt and a paternal grandmother were diagnosed with breast cancer in their 40s.  GYNECOLOGIC HISTORY:  No LMP recorded. Patient has had an ablation. Menarche age 33.  She underwent endometrial ablation 2011.  She is no longer having periods.  She never used hormone replacement.  First live birth was age 42.  She is GX P2.   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  Her husband [name] works as an Firefighter for [employer]  Daughter Kendy Haston attends [employer].  Son Xaria Judon is doing on apprenticeship in the [employer] program.    ADVANCED DIRECTIVES: Not in place   HEALTH MAINTENANCE: Social History   Tobacco Use  . Smoking status: Former Research scientist (life sciences)  . Smokeless tobacco: Never Used  Substance Use Topics  . Alcohol use: Yes    Alcohol/week: 0.0 standard drinks    Comment: sometimes  . Drug use: Yes    Types: Marijuana    Comment: last smoked 11-10-17 ("for anxiety"), uses daily     Colonoscopy: 2016/[facility]  PAP: October 2018  Bone density: Never   Allergies  Allergen Reactions  . Other Other (See Comments)    Red wine & white cheese: migraine Raw egg whites- hives    Current Outpatient Medications  Medication Sig Dispense Refill  . amitriptyline (ELAVIL) 50 MG tablet Take 25 mg by mouth at bedtime  as needed for sleep. Patient taking differently; take 50 mg daily.    Marland Kitchen azelastine (OPTIVAR) 0.05 % ophthalmic solution INSTILL 1 DROP INTO EACH EYE AS NEEDED  1  . Calcium Glycerophosphate (PRELIEF PO) Take 2 tablets by mouth as needed (dysuria).     . CALCIUM PO Take 1 tablet by mouth daily.    . Cetirizine HCl (ZYRTEC ALLERGY PO) Take 10 mg by mouth daily.     Marland Kitchen dimenhyDRINATE (DRAMAMINE) 50 MG tablet Take 50 mg by mouth every 8 (eight) hours as needed for nausea.     . fluticasone (FLONASE) 50 MCG/ACT nasal spray Place 2 sprays into both nostrils daily.    . Multiple Vitamin (MULTIVITAMIN) tablet Take 1 tablet by mouth daily.    . Naproxen Sod-diphenhydrAMINE (ALEVE PM PO) Take by mouth at bedtime.    Marland Kitchen OVER THE COUNTER MEDICATION Take 2 tablets by mouth as needed (excedrine - tension headache; for headache).     . OVER THE COUNTER MEDICATION Take 2 tablets by mouth as needed (Excedrine - pain reliever; for joint pain associated with fibromyalgia (acetaminophen  252m+Asprin 2552mcaffeine 6578m.     . Pseudoephedrine HCl (SUDAFED PO) Take 1 tablet by mouth as needed (psuedoephedrine HCl 78m43m    . Pseudoephedrine-guaiFENesin (MUCINEX D PO) Take 1 tablet by mouth as needed (guaifenesin 1200mg85mudoephedrine HCl 120mg)36m   No current facility-administered medications for this visit.     OBJECTIVE: Middle-aged white woman who appears stated age  Vitals22/06/19 0904  BP: 135/77  Pulse: (!) 110  Resp: 18  Temp: 98 F (36.7 C)  SpO2: 99%     Body mass index is 24.54 kg/m.   Wt Readings from Last 3 Encounters:  03/21/18 165 lb (74.8 kg)  03/11/18 174 lb 9.6 oz (79.2 kg)  02/28/18 172 lb (78 kg)  ECOG FS:1 - Symptomatic but completely ambulatory   Sclerae unicteric, pupils round and equal Oropharynx clear and moist No cervical or supraclavicular adenopathy Lungs no rales or rhonchi Heart regular rate and rhythm Abd soft, nontender, positive bowel sounds MSK no focal spinal tenderness, no upper extremity lymphedema Neuro: nonfocal, well oriented, appropriate affect Breasts: There is a mass in the upper inner quadrant of the right breast measuring approximately 2 cm, not associated with erythema or nipple retraction.  The right axilla is benign.  The left breast is status post lumpectomy, with no evidence of local recurrence.  The left axilla is benign. Skin: There is an easily palpable mass in the right buttock measuring in excess of 2 cm, not associated with erythema.  LAB RESULTS:  CMP     Component Value Date/Time   NA 138 02/28/2018 1128   NA 138 07/10/2017 1458   K 3.8 02/28/2018 1128   K 3.7 07/10/2017 1458   CL 101 02/28/2018 1128   CO2 29 02/28/2018 1128   CO2 25 07/10/2017 1458   GLUCOSE 94 02/28/2018 1128   GLUCOSE 95 07/10/2017 1458   BUN 7 02/28/2018 1128   BUN 7.4 07/10/2017 1458   CREATININE 0.75 02/28/2018 1128   CREATININE 0.7 07/10/2017 1458   CALCIUM 9.3 02/28/2018 1128   CALCIUM 8.9 07/10/2017 1458     PROT 7.3 02/28/2018 1128   PROT 7.1 07/10/2017 1458   ALBUMIN 3.9 02/28/2018 1128   ALBUMIN 3.8 07/10/2017 1458   AST 20 02/28/2018 1128   AST 10 07/10/2017 1458   ALT 14 02/28/2018 1128   ALT 11 07/10/2017 1458  ALKPHOS 96 02/28/2018 1128   ALKPHOS 92 07/10/2017 1458   BILITOT 0.3 02/28/2018 1128   BILITOT <0.22 07/10/2017 1458   GFRNONAA >60 02/28/2018 1128   GFRAA >60 02/28/2018 1128    No results found for: TOTALPROTELP, ALBUMINELP, A1GS, A2GS, BETS, BETA2SER, GAMS, MSPIKE, SPEI  No results found for: Nils Pyle, [facility]  Lab Results  Component Value Date   WBC 5.2 03/21/2018   NEUTROABS 3.6 03/21/2018   HGB 12.4 03/21/2018   HCT 36.7 03/21/2018   MCV 93.1 03/21/2018   PLT 281 03/21/2018      Chemistry      Component Value Date/Time   NA 138 02/28/2018 1128   NA 138 07/10/2017 1458   K 3.8 02/28/2018 1128   K 3.7 07/10/2017 1458   CL 101 02/28/2018 1128   CO2 29 02/28/2018 1128   CO2 25 07/10/2017 1458   BUN 7 02/28/2018 1128   BUN 7.4 07/10/2017 1458   CREATININE 0.75 02/28/2018 1128   CREATININE 0.7 07/10/2017 1458      Component Value Date/Time   CALCIUM 9.3 02/28/2018 1128   CALCIUM 8.9 07/10/2017 1458   ALKPHOS 96 02/28/2018 1128   ALKPHOS 92 07/10/2017 1458   AST 20 02/28/2018 1128   AST 10 07/10/2017 1458   ALT 14 02/28/2018 1128   ALT 11 07/10/2017 1458   BILITOT 0.3 02/28/2018 1128   BILITOT <0.22 07/10/2017 1458       No results found for: LABCA2  No components found for: GYJEHU314  No results for input(s): INR in the last 168 hours.  No results found for: LABCA2  No results found for: HFW263  No results found for: ZCH885  No results found for: OYD741  No results found for: CA2729  No components found for: HGQUANT  No results found for: CEA1 / No results found for: CEA1   No results found for: AFPTUMOR  No results found for: CHROMOGRNA  No results found for: PSA1  Appointment on 03/21/2018   Component Date Value Ref Range Status  . WBC 03/21/2018 5.2  3.9 - 10.3 K/uL Final  . RBC 03/21/2018 3.94  3.70 - 5.45 MIL/uL Final  . Hemoglobin 03/21/2018 12.4  11.6 - 15.9 g/dL Final  . HCT 03/21/2018 36.7  34.8 - 46.6 % Final  . MCV 03/21/2018 93.1  79.5 - 101.0 fL Final  . MCH 03/21/2018 31.5  25.1 - 34.0 pg Final  . MCHC 03/21/2018 33.8  31.5 - 36.0 g/dL Final  . RDW 03/21/2018 14.2  11.2 - 14.5 % Final  . Platelets 03/21/2018 281  145 - 400 K/uL Final  . Neutrophils Relative % 03/21/2018 69  % Final  . Neutro Abs 03/21/2018 3.6  1.5 - 6.5 K/uL Final  . Lymphocytes Relative 03/21/2018 20  % Final  . Lymphs Abs 03/21/2018 1.0  0.9 - 3.3 K/uL Final  . Monocytes Relative 03/21/2018 10  % Final  . Monocytes Absolute 03/21/2018 0.5  0.1 - 0.9 K/uL Final  . Eosinophils Relative 03/21/2018 1  % Final  . Eosinophils Absolute 03/21/2018 0.1  0.0 - 0.5 K/uL Final  . Basophils Relative 03/21/2018 0  % Final  . Basophils Absolute 03/21/2018 0.0  0.0 - 0.1 K/uL Final   Performed at [facility], [place] [address] [place], [place] [address]    (this displays the last labs from the last 3 days)  No results found for: TOTALPROTELP, ALBUMINELP, A1GS, A2GS, BETS, BETA2SER, GAMS, MSPIKE, SPEI (this displays  SPEP labs)  No results found for: KPAFRELGTCHN, LAMBDASER, KAPLAMBRATIO (kappa/lambda light chains)  No results found for: HGBA, HGBA2QUANT, HGBFQUANT, HGBSQUAN (Hemoglobinopathy evaluation)   No results found for: LDH  No results found for: IRON, TIBC, IRONPCTSAT (Iron and TIBC)  No results found for: FERRITIN  Urinalysis    Component Value Date/Time   COLORURINE YELLOW 07/07/2017 1400   APPEARANCEUR CLEAR 07/07/2017 1400   LABSPEC 1.003 (L) 07/07/2017 1400   LABSPEC 1.010 05/30/2017 1315   PHURINE 7.0 07/07/2017 1400   GLUCOSEU NEGATIVE 07/07/2017 1400   GLUCOSEU Negative 05/30/2017 1315   HGBUR NEGATIVE 07/07/2017 1400   BILIRUBINUR NEGATIVE  07/07/2017 1400   BILIRUBINUR Color Interference 05/30/2017 1315   KETONESUR NEGATIVE 07/07/2017 1400   PROTEINUR NEGATIVE 07/07/2017 1400   UROBILINOGEN Color Interference 05/30/2017 1315   NITRITE NEGATIVE 07/07/2017 1400   LEUKOCYTESUR NEGATIVE 07/07/2017 1400   LEUKOCYTESUR Color Interference 05/30/2017 1315     STUDIES: Mr Pelvis W Wo Contrast  Result Date: 03/21/2018 CLINICAL DATA:  Palpable mass in the right buttock for approximately 2 weeks in a patient with a history of breast cancer. EXAM: MRI PELVIS WITHOUT AND WITH CONTRAST TECHNIQUE: Multiplanar, multisequence MR imaging of the pelvis was performed following the administration of intravenous contrast. CONTRAST:  16 mL MULTIHANCE GADOBENATE DIMEGLUMINE 529 MG/ML IV SOLN COMPARISON:  None. FINDINGS: Bones/Joint/Cartilage Multiple foci of abnormal marrow signal with postcontrast enhancement are identified. The largest single lesion is seen in the proximal diaphysis of the right femur. The lesion fills the medullary space and measures 4.2 cm craniocaudal on image 26 of series 10. A second lesion is in the posterior right acetabulum measuring 1.6 x 1.6 cm in the axial plane on image 30 of series 4. A third index lesion in the anterior most aspect of the left iliac wing measures 1.1 cm AP by 1 cm transverse on image 14 of series 4. No fracture is identified. Ligaments Negative. Muscles and Tendons Intact. Soft tissues A marker is placed in the region of concern over the right buttock. In the deep subcutaneous fatty tissues, there is an enhancing mass which measures 2.9 cm transverse by 2.0 cm in the axial plane on image 17 of series 11. The lesion is contiguous with the gluteus maximus. A second enhancing mass lesion in the subcutaneous fatty tissues of the medial right thigh measures 2.3 by 2.5 cm on image 48 of series 11. IMPRESSION: Multiple foci of abnormal marrow signal and enhancement consistent with metastatic disease. Enhancing  subcutaneous lesions in the region of concern in the right buttock and medial aspect of the right thigh are also most consistent with metastatic disease. Electronically Signed   By: Inge Rise M.D.   On: 03/21/2018 08:57    ELIGIBLE FOR AVAILABLE RESEARCH PROTOCOL: SWOG 1418 depending on response to neoadjuvant treatment, UPBEAT  ASSESSMENT: 52 y.o. [place] status post left breast upper outer quadrant biopsy May 14, 2017 for a clinical T2 N0, stage IIB invasive ductal carcinoma, grade 3, triple negative, with an MIB-1 of 80%  (1) neoadjuvant chemotherapy consisting of doxorubicin and cyclophosphamide in dose dense fashion x4 started 06/04/2017, completed 07/17/2017; followed by weekly carboplatin and paclitaxel x12 started 07/30/2017, completed 10/22/2017  (a) echo on 05/30/2017: LVEF 55-60%  (2) Right lumpectomy and SLNB on 11/13/2017: showed ypT2 ypN0 residual invasive ductal carcinoma, grade 3, repeat prognostic panel again triple negative.  RCB-II  (3) adjuvant radiation with capecitabine sensitizaton  12/16/2017-01/31/2018, she did experience planter erythrodysesthesia with the Capecitabine, so it  was not continued for six months after sensitization   (4) genetics testing offered through [facility] 08/13/2016 showed: No deleterious mutations in ATM, BARD1, BRCA1, BRCA2, BRIP1, CDH1, CHEK2, DICER1, EPCAM*, MLH1, MSH2, MSH6, NBN, NF1, PALB2, PMS2, PTEN, RAD50, RAD51C, RAD51D, SMARCA4, STK11, TP53  (5) participating in [place] S1418, randomized to ARM 2= pembrolizumab Q 21 days, started 02/28/2018  METASTATIC DISEASE? Sept 2019 (6) pelvic MRI suggests bone involvement as well as subcutaneous masses in the right buttock  (a) right breast biopsy pending  (b) right buttock mass biopsy pending  (c) CT scans of brain chest abdomen and pelvis results pending  (d) bone scan pending  PLAN: Mattalyn did well with her first pembrolizumab dose, and is scheduled to repeat today.  We are checking  with the study chair to make sure this will be okay given her recent pelvic findings.  We are concerned but we do not yet know for sure whether she has metastatic disease.  We do need to proceed to staging studies so I am setting her up for a right breast biopsy, a right buttock biopsy, bone scan, and CTs of the brain chest abdomen and pelvis.  I am hoping we can get this done as soon as possible.  I am getting her a return appointment with [place] on 03/26/2018 to discuss results at that point.  Amy Dawson has a good understanding of what is going on.  She is very eager to move on with evaluation and treatment  I have asked her to call us with any other issues that may develop before the next visit.     [name], [name], MD  03/21/18 9:24 AM Medical Oncology and Hematology [facility] [address] [place], [place] [address] Tel. [phone]    Fax. [phone]  Alice Rieger, am acting as scribe for Chauncey Cruel MD.  I, Lurline Del MD, have reviewed the above documentation for accuracy and completeness, and I agree with the above.

## 2018-03-21 ENCOUNTER — Inpatient Hospital Stay: Payer: 59 | Attending: Oncology

## 2018-03-21 ENCOUNTER — Telehealth: Payer: Self-pay | Admitting: Oncology

## 2018-03-21 ENCOUNTER — Other Ambulatory Visit: Payer: Self-pay | Admitting: Oncology

## 2018-03-21 ENCOUNTER — Encounter: Payer: Self-pay | Admitting: *Deleted

## 2018-03-21 ENCOUNTER — Inpatient Hospital Stay: Payer: 59

## 2018-03-21 ENCOUNTER — Inpatient Hospital Stay (HOSPITAL_BASED_OUTPATIENT_CLINIC_OR_DEPARTMENT_OTHER): Payer: 59 | Admitting: Oncology

## 2018-03-21 VITALS — BP 135/77 | HR 110 | Temp 98.0°F | Resp 18 | Wt 165.0 lb

## 2018-03-21 DIAGNOSIS — Z171 Estrogen receptor negative status [ER-]: Secondary | ICD-10-CM

## 2018-03-21 DIAGNOSIS — Z923 Personal history of irradiation: Secondary | ICD-10-CM | POA: Diagnosis not present

## 2018-03-21 DIAGNOSIS — Z87891 Personal history of nicotine dependence: Secondary | ICD-10-CM

## 2018-03-21 DIAGNOSIS — Z5112 Encounter for antineoplastic immunotherapy: Secondary | ICD-10-CM | POA: Diagnosis present

## 2018-03-21 DIAGNOSIS — C50412 Malignant neoplasm of upper-outer quadrant of left female breast: Secondary | ICD-10-CM

## 2018-03-21 DIAGNOSIS — N6310 Unspecified lump in the right breast, unspecified quadrant: Secondary | ICD-10-CM

## 2018-03-21 DIAGNOSIS — C7989 Secondary malignant neoplasm of other specified sites: Secondary | ICD-10-CM | POA: Diagnosis not present

## 2018-03-21 DIAGNOSIS — Z95828 Presence of other vascular implants and grafts: Secondary | ICD-10-CM

## 2018-03-21 DIAGNOSIS — M799 Soft tissue disorder, unspecified: Secondary | ICD-10-CM | POA: Diagnosis not present

## 2018-03-21 DIAGNOSIS — Z9221 Personal history of antineoplastic chemotherapy: Secondary | ICD-10-CM | POA: Insufficient documentation

## 2018-03-21 LAB — CMP (CANCER CENTER ONLY)
ALT: 9 U/L (ref 0–44)
AST: 21 U/L (ref 15–41)
Albumin: 3.6 g/dL (ref 3.5–5.0)
Alkaline Phosphatase: 111 U/L (ref 38–126)
Anion gap: 12 (ref 5–15)
BILIRUBIN TOTAL: 0.4 mg/dL (ref 0.3–1.2)
BUN: 9 mg/dL (ref 6–20)
CO2: 27 mmol/L (ref 22–32)
CREATININE: 0.84 mg/dL (ref 0.44–1.00)
Calcium: 9.8 mg/dL (ref 8.9–10.3)
Chloride: 99 mmol/L (ref 98–111)
GFR, Est AFR Am: >60 mL/min (ref >60)
Glucose, Bld: 126 mg/dL — ABNORMAL HIGH (ref 70–99)
Potassium: 3.6 mmol/L (ref 3.5–5.1)
SODIUM: 138 mmol/L (ref 135–145)
Total Protein: 7.9 g/dL (ref 6.5–8.1)

## 2018-03-21 LAB — CBC WITH DIFFERENTIAL/PLATELET
Basophils Absolute: 0 10*3/uL (ref 0.0–0.1)
Basophils Relative: 0 %
EOS ABS: 0.1 10*3/uL (ref 0.0–0.5)
Eosinophils Relative: 1 %
HCT: 36.7 % (ref 34.8–46.6)
Hemoglobin: 12.4 g/dL (ref 11.6–15.9)
LYMPHS ABS: 1 10*3/uL (ref 0.9–3.3)
LYMPHS PCT: 20 %
MCH: 31.5 pg (ref 25.1–34.0)
MCHC: 33.8 g/dL (ref 31.5–36.0)
MCV: 93.1 fL (ref 79.5–101.0)
MONO ABS: 0.5 10*3/uL (ref 0.1–0.9)
MONOS PCT: 10 %
Neutro Abs: 3.6 10*3/uL (ref 1.5–6.5)
Neutrophils Relative %: 69 %
Platelets: 281 10*3/uL (ref 145–400)
RBC: 3.94 MIL/uL (ref 3.70–5.45)
RDW: 14.2 % (ref 11.2–14.5)
WBC: 5.2 10*3/uL (ref 3.9–10.3)

## 2018-03-21 LAB — TSH

## 2018-03-21 MED ORDER — SODIUM CHLORIDE 0.9% FLUSH
10.0000 mL | Freq: Once | INTRAVENOUS | Status: AC
  Administered 2018-03-21: 10 mL
  Filled 2018-03-21: qty 10

## 2018-03-21 MED ORDER — SODIUM CHLORIDE 0.9% FLUSH
10.0000 mL | INTRAVENOUS | Status: DC | PRN
  Administered 2018-03-21: 10 mL
  Filled 2018-03-21: qty 10

## 2018-03-21 MED ORDER — SODIUM CHLORIDE 0.9 % IV SOLN
200.0000 mg | Freq: Once | INTRAVENOUS | Status: AC
  Administered 2018-03-21: 200 mg via INTRAVENOUS
  Filled 2018-03-21: qty 8

## 2018-03-21 MED ORDER — HEPARIN SOD (PORK) LOCK FLUSH 100 UNIT/ML IV SOLN
500.0000 [IU] | Freq: Once | INTRAVENOUS | Status: AC | PRN
  Administered 2018-03-21: 500 [IU]
  Filled 2018-03-21: qty 5

## 2018-03-21 MED ORDER — SODIUM CHLORIDE 0.9 % IV SOLN
Freq: Once | INTRAVENOUS | Status: AC
  Administered 2018-03-21: 12:00:00 via INTRAVENOUS
  Filled 2018-03-21: qty 250

## 2018-03-21 NOTE — Progress Notes (Signed)
03/21/18 at 11:28am - [id number] cycle 1, day 22 study notes- The pt was into the cancer center this morning for her day 22 pembrolizumab infusion.  The pt's doctor wanted the pt to have some labs drawn before her infusion and to see him for follow up.  The physician was told that labs and the H/P are at his discretion, not required by the protocol.  The pt had her MRI pelvis yesterday afternoon.  Dr. [name] reviewed the results with the pt.  The radiologist impression was that her "multiple foci of abnormal marrow signal and enhancement is consistent with metastatic disease as well the enhancing subcutaneous lesions in the region of concern in the right buttock and medial aspect of the right thigh is also most consistent with metastatic disease".  Dr. [name] wanted to proceed with her day 30 pembrolizumab today because there is not definitive evidence of metastatic disease.  The research nurse contacted Dr. Lennox Grumbles, study chair, about this "possible" recurrence of disease.  The research nurse also reviewed the protocol section 7.5 and section 10.0.  Section 10.0 states that "whenever possible, recurrences should be documented histologically".  The study chair agreed that the pt should remain on study drug until a biopsy confirms disease progression.  He said that all good oncologists do not solely rely on imaging.  Therefore, Dr. [name] was informed that the pt could receive her pembrolizumab today.  Dr. [name] examined the pt today, and the pt has developed several other areas of concern.  The pt said that she recently noticed a breast lump.  She said that she does breast self-exams daily, and it became palpable "overnight". The pt also reports pain in her buttock area, but she says the mass has shrunk since her first treatment.  Dr. [name] decided to fully evaluate the pt for metastatic disease.  He has ordered a breast and buttock biopsy.  He has also ordered a CT CAP and bone scan.  The research nurse met  with the pt in the infusion room and reviewed the new Informed Consent Addenda regarding the clarification of the heart risk.  The pt read the addenda and said that she is comfortable continuing her participation in the study.  The pt signed the new addenda today at 11:10am.  The pt was given a copy of her signed addenda for her records.  The pt was given some emotional support.  The research nurse gave Vicente Males, infusion nurse, the pembrolizumab infusion sign and verbally discussed that the infusion needs to be 30 minutes with an acceptable range of 25-40 minutes.  The infusion nurse verbalized understanding.  The research nurse will monitor this pt's status closely and will update the Study Chair, Dr. Lennox Grumbles, with the findings of the pt's biopsies and scans next week.  The pt knows that if the biopsies confirm metastatic disease then she will need to be removed from the protocol treatment.   Brion Aliment RN, BSN, CCRP  Clinical Research Nurse 03/21/2018 12:04 PM   03/26/18 at 1:58pm - The pt [facility] into [facility] to go over her most recent buttock biopsy on 03/25/18.  The pt was told by Dr. [name] that the preliminary report on her biopsy is that it is metastatic breast cancer.  The pt is still scheduled for several staging scans.  The pt's doctor asked the nurse to contact the study chair to see if the pt could remain on the study since she appears to be responding to pembrolizumab.  The research nurse stated that the pt would have to be removed from the protocol treatment b/c of her disease recurrence.  The pt's only new AE reported to the NP was insomnia.  The pt was given a medication to help her rest better.  The research nurse informed Cat, data coordinator at [facility], about the pt's recurrence.  She advised the nurse to complete the pt's cycle 1 data using the pt's biopsy date as the end of the study reporting period.  The research nurse will await the pt's final biopsy pathology report before notifying  Dr. Lennox Grumbles, study chair.   Brion Aliment RN, BSN, CCRP Clinical Research Nurse 03/26/2018 4:08 PM   04/01/18 at 4:29pm- The research nurse received the pt's final pathology report from 03/25/18 confirming the pt's metastatic disease.  The research nurse notified the study chair, Dr. Lennox Grumbles, by email about the pt's metastatic disease.  The research nurse is completing the pt's cycle 1 data.  The pt's next follow up will be in March 2020.   Brion Aliment RN, BSN, CCRP Clinical Research Nurse 04/01/2018 4:35 PM   05/27/18 at 12:42pm -Late entry note from 05/19/18- The research nurse found the pt's obituary on the [employer]  The pt died on Jun 08, 2018.  Dr. [name] had referred the pt to Hospice on 05-30-18, and the pt's death was expected due to her widespread, metastatic breast cancer.   Dr. [name] was informed of the pt's death on 14-Jun-2018.  The research nurse received the pt's death certificate which listed her "breast cancer" as the cause of death.  The pt's last dose of study drug, pembrolizumab, was given to her on 03/21/18.  The pt's AE's were assessed for 30 days after the pt's last dose of pembrolizumab (04/21/18).  The research nurse and Dr. [name] met together on 05/16/18 and reviewed all of the pt's cycle 1 AE's through 04/21/18, and Dr. [name] provided attribution for each of her AE's.  Brion Aliment RN, BSN, CCRP Clinical Research Nurse 05/27/2018 1:35 PM    Dory Larsen Jomarie Longs [phone]  Adverse Event Log  Study/Protocol: SWOG K8127  Cycle: 1 AE's through 04/21/18  Event Grade Onset Date Resolved Date Drug Name Attribution Immune Related Comments  Glucose 1 03/21/2018 04/21/18 Pembrolizumab Unrelated No  Dose not changed  TSH/ Hypothyroidism  1 03/21/2018 04/21/18 Pembrolizumab Possible Yes Dose not changed  Bone Pain (shoulder and rib) 1 03/21/2018  Ongoing Pembrolizumab Unrelated No  Dose not changed  Blurred Vision 1 03/21/2018 03/26/2018  Pembrolizumab Unlikely No  Dose not changed  Insomnia 2 03/26/2018 Ongoing  Pembrolizumab Unlikely No Pt is off tx due to recurrence   Pain-- (hip/buttock) 1 03/11/2018 Ongoing Pembrolizumab Unrelated No  Dose not changed  Hemoptysis 1 03/11/2018 03/21/2018 Pembrolizumab Unrelated No  Dose not changed  Nausea 2 04/09/2018 Ongoing  Pembrolizumab Unlikely No  Pt is off tx due to recurrence   Fatigue 2 04/10/2018 Ongoing Pembrolizumab Possible  Yes Pt is off tx due to recurrence  Constipation  2 04/18/2018 Ongoing Pembrolizumab Unrelated No  Pt is off tx due to recurrence

## 2018-03-21 NOTE — Patient Instructions (Signed)
[facility] Discharge Instructions for Patients Receiving Chemotherapy  Today you received the following chemotherapy agents pembrolizumab (Keytruda)  To help prevent nausea and vomiting after your treatment, we encourage you to take your nausea medication as directed by your doctor.   If you develop nausea and vomiting that is not controlled by your nausea medication, call the clinic.   BELOW ARE SYMPTOMS THAT SHOULD BE REPORTED IMMEDIATELY:  *FEVER GREATER THAN 100.5 F  *CHILLS WITH OR WITHOUT FEVER  NAUSEA AND VOMITING THAT IS NOT CONTROLLED WITH YOUR NAUSEA MEDICATION  *UNUSUAL SHORTNESS OF BREATH  *UNUSUAL BRUISING OR BLEEDING  TENDERNESS IN MOUTH AND THROAT WITH OR WITHOUT PRESENCE OF ULCERS  *URINARY PROBLEMS  *BOWEL PROBLEMS  UNUSUAL RASH Items with * indicate a potential emergency and should be followed up as soon as possible.  Feel free to call the clinic should you have any questions or concerns. The clinic phone number is [phone].  Please show the CHEMO ALERT CARD at check-in to the Emergency Department and triage nurse.   

## 2018-03-21 NOTE — Telephone Encounter (Signed)
Gave patient avs, calendar and contrast.

## 2018-03-24 ENCOUNTER — Other Ambulatory Visit: Payer: Self-pay | Admitting: Oncology

## 2018-03-24 ENCOUNTER — Other Ambulatory Visit: Payer: Self-pay | Admitting: Physician Assistant

## 2018-03-24 ENCOUNTER — Other Ambulatory Visit: Payer: Self-pay | Admitting: *Deleted

## 2018-03-24 DIAGNOSIS — Z171 Estrogen receptor negative status [ER-]: Principal | ICD-10-CM

## 2018-03-24 DIAGNOSIS — C50412 Malignant neoplasm of upper-outer quadrant of left female breast: Secondary | ICD-10-CM

## 2018-03-25 ENCOUNTER — Telehealth: Payer: Self-pay | Admitting: Radiation Oncology

## 2018-03-25 ENCOUNTER — Other Ambulatory Visit: Payer: Self-pay

## 2018-03-25 ENCOUNTER — Ambulatory Visit (HOSPITAL_COMMUNITY)
Admission: RE | Admit: 2018-03-25 | Discharge: 2018-03-25 | Disposition: A | Discharge status: Home / Self Care | Payer: 59 | Source: Ambulatory Visit | Attending: Oncology | Admitting: Oncology

## 2018-03-25 ENCOUNTER — Ambulatory Visit (HOSPITAL_COMMUNITY)
Admission: RE | Admit: 2018-03-25 | Discharge: 2018-03-25 | Disposition: A | Discharge status: Home / Self Care | Payer: 59 | Source: Ambulatory Visit | Attending: Family Medicine | Admitting: Family Medicine

## 2018-03-25 ENCOUNTER — Encounter (HOSPITAL_COMMUNITY): Payer: Self-pay

## 2018-03-25 DIAGNOSIS — F419 Anxiety disorder, unspecified: Secondary | ICD-10-CM | POA: Insufficient documentation

## 2018-03-25 DIAGNOSIS — Z87891 Personal history of nicotine dependence: Secondary | ICD-10-CM | POA: Insufficient documentation

## 2018-03-25 DIAGNOSIS — Z79899 Other long term (current) drug therapy: Secondary | ICD-10-CM | POA: Insufficient documentation

## 2018-03-25 DIAGNOSIS — Z803 Family history of malignant neoplasm of breast: Secondary | ICD-10-CM | POA: Diagnosis not present

## 2018-03-25 DIAGNOSIS — Z9889 Other specified postprocedural states: Secondary | ICD-10-CM | POA: Insufficient documentation

## 2018-03-25 DIAGNOSIS — Z171 Estrogen receptor negative status [ER-]: Secondary | ICD-10-CM | POA: Diagnosis not present

## 2018-03-25 DIAGNOSIS — N189 Chronic kidney disease, unspecified: Secondary | ICD-10-CM | POA: Insufficient documentation

## 2018-03-25 DIAGNOSIS — Z800 Family history of malignant neoplasm of digestive organs: Secondary | ICD-10-CM | POA: Insufficient documentation

## 2018-03-25 DIAGNOSIS — F329 Major depressive disorder, single episode, unspecified: Secondary | ICD-10-CM | POA: Insufficient documentation

## 2018-03-25 DIAGNOSIS — C50412 Malignant neoplasm of upper-outer quadrant of left female breast: Secondary | ICD-10-CM | POA: Diagnosis not present

## 2018-03-25 LAB — CBC
HCT: 37.9 % (ref 36.0–46.0)
HEMOGLOBIN: 13 g/dL (ref 12.0–15.0)
MCH: 31.7 pg (ref 26.0–34.0)
MCHC: 34.3 g/dL (ref 30.0–36.0)
MCV: 92.4 fL (ref 78.0–100.0)
PLATELETS: 291 10*3/uL (ref 150–400)
RBC: 4.1 MIL/uL (ref 3.87–5.11)
RDW: 12.9 % (ref 11.5–15.5)
WBC: 4.9 10*3/uL (ref 4.0–10.5)

## 2018-03-25 LAB — APTT: APTT: 31 s (ref 24–36)

## 2018-03-25 LAB — PROTIME-INR
INR: 0.98
PROTHROMBIN TIME: 12.9 s (ref 11.4–15.2)

## 2018-03-25 MED ORDER — MIDAZOLAM HCL 2 MG/2ML IJ SOLN
INTRAMUSCULAR | Status: AC | PRN
  Administered 2018-03-25 (×2): 1 mg via INTRAVENOUS

## 2018-03-25 MED ORDER — LIDOCAINE HCL (PF) 1 % IJ SOLN
INTRAMUSCULAR | Status: AC | PRN
  Administered 2018-03-25: 10 mL

## 2018-03-25 MED ORDER — SODIUM CHLORIDE 0.9 % IV SOLN
INTRAVENOUS | Status: DC
  Administered 2018-03-25: 10:00:00 via INTRAVENOUS

## 2018-03-25 MED ORDER — FENTANYL CITRATE (PF) 100 MCG/2ML IJ SOLN
INTRAMUSCULAR | Status: AC
  Filled 2018-03-25: qty 2

## 2018-03-25 MED ORDER — MIDAZOLAM HCL 2 MG/2ML IJ SOLN
INTRAMUSCULAR | Status: AC
  Filled 2018-03-25: qty 4

## 2018-03-25 MED ORDER — FENTANYL CITRATE (PF) 100 MCG/2ML IJ SOLN
INTRAMUSCULAR | Status: AC | PRN
  Administered 2018-03-25 (×2): 50 ug via INTRAVENOUS

## 2018-03-25 NOTE — Procedures (Signed)
Interventional Radiology Procedure Note  Procedure: CT guided biopsy of right buttock soft tissue mass  Complications: None  Estimated Blood Loss: < 10 mL  Findings: Soft tissue mass of right gluteal region superficial to gluteal muscles localized by CT.  Measures 3.5 cm in greatest diameter.  18 G core biopsy x 4 via 17 G needle.   Venetia Night. Kathlene Cote, M.D Pager:  [phone]

## 2018-03-25 NOTE — H&P (Signed)
Chief Complaint: Patient was seen in consultation today for image guided right buttocks mass biopsy  Referring Physician(s): Chauncey Cruel  Supervising Physician: Aletta Edouard  Patient Status: [name] - Out-pt  History of Present Illness: Amy Dawson is a 52 y.o. female with history of left breast carcinoma initially diagnosed in 2018, status post lumpectomy and chemoradiation.  She has had a palpable mass in the right buttock region for approximately 2 to 3 weeks and recent MRI reveals multiple foci of abnormal bone marrow signal and enhancement consistent with metastatic disease as well as enhancing subcutaneous lesions in right buttock and medial aspect of right thigh concerning for metastatic disease as well.  She presents today for image guided biopsy of the right buttocks mass for further evaluation.  Past Medical History:  Diagnosis Date  . Allergy 1993   Per patient 02/28/18: Diagnosed approximately 1993.  Marland Kitchen Anxiety 1995   Per patient 02/28/18: Diagnosed approximately 1995  . Breast cancer ([place])   . Chronic kidney disease 2019   bladder issues; Per patient 02/28/18, experiences burning with urination with certain food and beverages (caffeine, spicy foods, etc.).  Experienced for several years until on Keto diet, then symptoms reccured after stopping keto diet in Feb 2019  . Depression 2002   Per patient 02/28/18: diagnosed approximately 2002, does not have bipolar depression.  . Family history of breast cancer   . Fibromyalgia 2002   Per patient 02/28/18: diagnosed approximately 2002  . Genetic testing 06/13/2017   Breast/GYN panel (23 genes) @ [facility] - No pathogenic mutations detected  . Headache 1993   smokes marijuana for migraine pain; Per patient 02/28/18:began in approximately 1993, experiences tension headaches and migraine headaches  . Murmur, cardiac 1978   as a child; Per patient 02/28/18: diagnosed approximately 1978  . Nausea 1978   Per patient 02/28/18:  experiences occassional nausea since about age 16  . Personal history of chemotherapy 2018  . Stroke [facility]) 2000   numbness on left side; Per patient 02/28/18: partial numbness but retains full use of left side, is not limited  . TMJ (dislocation of temporomandibular joint) 1995   Per patient 02/28/18: diagnosed in approximately 1995    Past Surgical History:  Procedure Laterality Date  . BREAST BIOPSY Left 05/14/2017   malignant  . BREAST LUMPECTOMY WITH AXILLARY LYMPH NODE BIOPSY Left 11/13/2017   Procedure: LEFT BREAST LUMPECTOMY WITH SENTINEL LYMPH NODE BIOPSY;  Surgeon: [name], MD;  Location: [place];  Service: General;  Laterality: Left;  . CESAREAN SECTION     2  . ENDOMETRIAL ABLATION  2011  . PORTACATH PLACEMENT Right 05/31/2017   Procedure: INSERTION PORT-A-CATH WITH [place];  Surgeon: [name], MD;  Location: [place];  Service: General;  Laterality: Right;    Allergies: Other  Medications: Prior to Admission medications   Medication Sig Start Date End Date Taking? Authorizing Provider  amitriptyline (ELAVIL) 50 MG tablet Take 25 mg by mouth at bedtime as needed for sleep. Patient taking differently; take 50 mg daily.    [provider]  azelastine (OPTIVAR) 0.05 % ophthalmic solution INSTILL 1 DROP INTO EACH EYE AS NEEDED 07/23/17   [provider]  Calcium Glycerophosphate (PRELIEF PO) Take 2 tablets by mouth as needed (dysuria).     [provider]  CALCIUM PO Take 1 tablet by mouth daily.    [provider]  Cetirizine HCl (ZYRTEC ALLERGY PO) Take 10 mg by mouth daily.  [provider]  dimenhyDRINATE (DRAMAMINE) 50 MG tablet Take 50 mg by mouth every 8 (eight) hours as needed for nausea.     [provider]  fluticasone (FLONASE) 50 MCG/ACT nasal spray Place 2 sprays into both nostrils daily.    [provider]  Multiple Vitamin (MULTIVITAMIN) tablet Take 1  tablet by mouth daily.    [provider]  Naproxen Sod-diphenhydrAMINE (ALEVE PM PO) Take by mouth at bedtime.    [provider]  OVER THE COUNTER MEDICATION Take 2 tablets by mouth as needed (excedrine - tension headache; for headache).     [provider]  OVER THE COUNTER MEDICATION Take 2 tablets by mouth as needed (Excedrine - pain reliever; for joint pain associated with fibromyalgia (acetaminophen 250mg +Asprin 250mg +caffeine 65mg )).     [provider]  Pseudoephedrine HCl (SUDAFED PO) Take 1 tablet by mouth as needed (psuedoephedrine HCl 60mg ).     [provider]  Pseudoephedrine-guaiFENesin [facility] D PO) Take 1 tablet by mouth as needed (guaifenesin 1200mg +pseudoephedrine HCl 120mg ).     [provider]     Family History  Problem Relation Age of Onset  . Liver cancer Sister        "rare type"; deceased 40  . Breast cancer Maternal Aunt        Dx 60s; currently 58s  . Breast cancer Paternal Grandmother        Dx 57s; deceased 40s  . Cancer Paternal Aunt        unk. type; deceased 31  . Colon cancer Neg Hx   . Esophageal cancer Neg Hx   . Rectal cancer Neg Hx   . Stomach cancer Neg Hx     Social History   Socioeconomic History  . Marital status: Married    Spouse name: Not on file  . Number of children: Not on file  . Years of education: Not on file  . Highest education level: Not on file  Occupational History  . Not on file  Social Needs  . Financial resource strain: Not on file  . Food insecurity:    Worry: Not on file    Inability: Not on file  . Transportation needs:    Medical: Not on file    Non-medical: Not on file  Tobacco Use  . Smoking status: Former Research scientist (life sciences)  . Smokeless tobacco: Never Used  Substance and Sexual Activity  . Alcohol use: Yes    Alcohol/week: 0.0 standard drinks    Comment: sometimes  . Drug use: Yes    Types: Marijuana    Comment: last smoked 11-10-17 ("for anxiety"), uses  daily  . Sexual activity: Not on file  Lifestyle  . Physical activity:    Days per week: Not on file    Minutes per session: Not on file  . Stress: Not on file  Relationships  . Social connections:    Talks on phone: Not on file    Gets together: Not on file    Attends religious service: Not on file    Active member of club or organization: Not on file    Attends meetings of clubs or organizations: Not on file    Relationship status: Not on file  Other Topics Concern  . Not on file  Social History Narrative  . Not on file     Review of Systems denies fever, headache, chest pain, dyspnea, cough, back pain, nausea, vomiting or bleeding.  She does have some intermittent left lateral  abdominal discomfort.  Vital Signs: BP 114/72   Pulse (!) 101   Temp 98 F (36.7 C) (Oral)   Resp 16   SpO2 96%   Physical Exam awake, alert.  Chest clear to auscultation bilaterally.  Clean, intact right chest wall Port-A-Cath.  Heart with tachycardic but regular rhythm.  Abdomen soft, positive bowel sounds, nontender.  No lower extremity edema.  Palpable subcutaneous nodule right lateral buttocks region, nontender to palpation.(also with medial rt thigh SQ mass by imaging)  Imaging: Mr Pelvis W Wo Contrast  Result Date: 03/21/2018 CLINICAL DATA:  Palpable mass in the right buttock for approximately 2 weeks in a patient with a history of breast cancer. EXAM: MRI PELVIS WITHOUT AND WITH CONTRAST TECHNIQUE: Multiplanar, multisequence MR imaging of the pelvis was performed following the administration of intravenous contrast. CONTRAST:  16 mL MULTIHANCE GADOBENATE DIMEGLUMINE 529 MG/ML IV SOLN COMPARISON:  None. FINDINGS: Bones/Joint/Cartilage Multiple foci of abnormal marrow signal with postcontrast enhancement are identified. The largest single lesion is seen in the proximal diaphysis of the right femur. The lesion fills the medullary space and measures 4.2 cm craniocaudal on image 26 of series 10. A  second lesion is in the posterior right acetabulum measuring 1.6 x 1.6 cm in the axial plane on image 30 of series 4. A third index lesion in the anterior most aspect of the left iliac wing measures 1.1 cm AP by 1 cm transverse on image 14 of series 4. No fracture is identified. Ligaments Negative. Muscles and Tendons Intact. Soft tissues A marker is placed in the region of concern over the right buttock. In the deep subcutaneous fatty tissues, there is an enhancing mass which measures 2.9 cm transverse by 2.0 cm in the axial plane on image 17 of series 11. The lesion is contiguous with the gluteus maximus. A second enhancing mass lesion in the subcutaneous fatty tissues of the medial right thigh measures 2.3 by 2.5 cm on image 48 of series 11. IMPRESSION: Multiple foci of abnormal marrow signal and enhancement consistent with metastatic disease. Enhancing subcutaneous lesions in the region of concern in the right buttock and medial aspect of the right thigh are also most consistent with metastatic disease. Electronically Signed   By: Inge Rise M.D.   On: 03/21/2018 08:57    Labs:  CBC: Recent Labs    01/21/18 1315 02/19/18 0908 02/28/18 1128 03/21/18 0847  WBC 2.8* 4.2 3.8* 5.2  HGB 12.5 13.6 13.2 12.4  HCT 37.5 39.8 39.4 36.7  PLT 195 218 238 281    COAGS: No results for input(s): INR, APTT in the last 8760 hours.  BMP: Recent Labs    01/21/18 1315 02/19/18 0908 02/28/18 1128 03/21/18 0847  NA 138 137 138 138  K 3.9 4.0 3.8 3.6  CL 101 99 101 99  CO2 31 28 29 27   GLUCOSE 107* 125* 94 126*  BUN 8 10 7 9   CALCIUM 9.7 9.7 9.3 9.8  CREATININE 0.82 0.86 0.75 0.84  GFRNONAA >60 >60 >60 >60  GFRAA >60 >60 >60 >60    LIVER FUNCTION TESTS: Recent Labs    01/21/18 1315 02/19/18 0908 02/28/18 1128 03/21/18 0847  BILITOT 0.4 0.3 0.3 0.4  AST 29 21 20 21   ALT 30 18 14 9   ALKPHOS 86 101 96 111  PROT 6.9 7.7 7.3 7.9  ALBUMIN 4.1 4.0 3.9 3.6    TUMOR MARKERS: No  results for input(s): AFPTM, CEA, CA199, CHROMGRNA in the last 8760 hours.  Assessment and Plan: 52 y.o. female with history of left breast carcinoma initially diagnosed in 2018, status post lumpectomy and chemoradiation.  She has had a palpable mass in the right buttock region for approximately 2 to 3 weeks and recent MRI reveals multiple foci of abnormal bone marrow signal and enhancement consistent with metastatic disease as well as enhancing subcutaneous lesions in right buttock and medial aspect of right thigh concerning for metastatic disease as well.  She presents today for image guided biopsy of the right buttocks mass for further evaluation.Risks and benefits discussed with the patient including, but not limited to bleeding, infection, damage to adjacent structures or low yield requiring additional tests.  All of the patient's questions were answered, patient is agreeable to proceed. Consent signed and in chart.  LABS PENDING   Thank you for this interesting consult.  I greatly enjoyed meeting ASHAWNA HANBACK and look forward to participating in their care.  A copy of this report was sent to the requesting provider on this date.  Electronically Signed: D. Rowe Robert, PA-C 03/25/2018, 9:34 AM   I spent a total of  20 minutes   in face to face in clinical consultation, greater than 50% of which was counseling/coordinating care for image guided biopsy of right buttocks mass

## 2018-03-25 NOTE — Telephone Encounter (Signed)
I called the patient and she is in recovery after a biopsy of her right hip lesion seen on recent MRI. I let her know we had discussed her case as a group, and Dr. Lisbeth Renshaw would like to offer radiation if she indeed has malignancy confirmed on her biopsy. He would like to simulate once she has a bone scan next Tuesday. She is in agreement and will come in for simulation on Wednesday 04/02/18 at 1pm

## 2018-03-25 NOTE — Discharge Instructions (Signed)
Needle Biopsy, Care After These instructions give you information about caring for yourself after your procedure. Your doctor may also give you more specific instructions. Call your doctor if you have any problems or questions after your procedure. Follow these instructions at home:  Rest as told by your doctor.  Take medicines only as told by your doctor.  There are many different ways to close and cover the biopsy site, including stitches (sutures), skin glue, and adhesive strips. Follow instructions from your doctor about: ? How to take care of your biopsy site. ? When and how you should change your bandage (dressing). ? When you should remove your dressing. ? Removing whatever was used to close your biopsy site.  Check your biopsy site every day for signs of infection. Watch for: ? Redness, swelling, or pain. ? Fluid, blood, or pus. Contact a doctor if:  You have a fever.  You have redness, swelling, or pain at the biopsy site, and it lasts longer than a few days.  You have fluid, blood, or pus coming from the biopsy site.  You feel sick to your stomach (nauseous).  You throw up (vomit). Get help right away if: Moderate Conscious Sedation, Adult, Care After These instructions provide you with information about caring for yourself after your procedure. Your health care provider may also give you more specific instructions. Your treatment has been planned according to current medical practices, but problems sometimes occur. Call your health care provider if you have any problems or questions after your procedure. What can I expect after the procedure? After your procedure, it is common:  To feel sleepy for several hours.  To feel clumsy and have poor balance for several hours.  To have poor judgment for several hours.  To vomit if you eat too soon.  Follow these instructions at home: For at least 24 hours after the procedure:   Do not: ? Participate in activities  where you could fall or become injured. ? Drive. ? Use heavy machinery. ? Drink alcohol. ? Take sleeping pills or medicines that cause drowsiness. ? Make important decisions or sign legal documents. ? Take care of children on your own.  Rest. Eating and drinking  Follow the diet recommended by your health care provider.  If you vomit: ? Drink water, juice, or soup when you can drink without vomiting. ? Make sure you have little or no nausea before eating solid foods. General instructions  Have a responsible adult stay with you until you are awake and alert.  Take over-the-counter and prescription medicines only as told by your health care provider.  If you smoke, do not smoke without supervision.  Keep all follow-up visits as told by your health care provider. This is important. Contact a health care provider if:  You keep feeling nauseous or you keep vomiting.  You feel light-headed.  You develop a rash.  You have a fever. Get help right away if:  You have trouble breathing. This information is not intended to replace advice given to you by your health care provider. Make sure you discuss any questions you have with your health care provider. Document Released: 04/22/2013 Document Revised: 12/05/2015 Document Reviewed: 10/22/2015 Elsevier Interactive Patient Education  2018 [place] are short of breath.  You have trouble breathing.  Your chest hurts.  You feel dizzy or you pass out (faint).  You have bleeding that does not stop with pressure or a bandage.  You cough up blood.  Your belly (abdomen) hurts. This information is not intended to replace advice given to you by your health care provider. Make sure you discuss any questions you have with your health care provider. Document Released: 06/14/2008 Document Revised: 12/08/2015 Document Reviewed: 06/28/2014 Elsevier Interactive Patient Education  [employer].

## 2018-03-26 ENCOUNTER — Inpatient Hospital Stay (HOSPITAL_BASED_OUTPATIENT_CLINIC_OR_DEPARTMENT_OTHER): Payer: 59 | Admitting: Adult Health

## 2018-03-26 ENCOUNTER — Encounter: Payer: Self-pay | Admitting: Adult Health

## 2018-03-26 VITALS — BP 105/50 | HR 107 | Temp 98.4°F | Resp 18 | Ht 68.75 in | Wt 163.7 lb

## 2018-03-26 DIAGNOSIS — Z171 Estrogen receptor negative status [ER-]: Secondary | ICD-10-CM | POA: Diagnosis not present

## 2018-03-26 DIAGNOSIS — Z9221 Personal history of antineoplastic chemotherapy: Secondary | ICD-10-CM | POA: Diagnosis not present

## 2018-03-26 DIAGNOSIS — C50412 Malignant neoplasm of upper-outer quadrant of left female breast: Secondary | ICD-10-CM | POA: Diagnosis not present

## 2018-03-26 DIAGNOSIS — C7989 Secondary malignant neoplasm of other specified sites: Secondary | ICD-10-CM

## 2018-03-26 DIAGNOSIS — Z5112 Encounter for antineoplastic immunotherapy: Secondary | ICD-10-CM | POA: Diagnosis not present

## 2018-03-26 DIAGNOSIS — Z923 Personal history of irradiation: Secondary | ICD-10-CM

## 2018-03-26 DIAGNOSIS — Z87891 Personal history of nicotine dependence: Secondary | ICD-10-CM

## 2018-03-26 MED ORDER — LORAZEPAM 0.5 MG PO TABS: 0.5000 mg | ORAL_TABLET | Freq: Every day | ORAL | 0 refills | Status: AC

## 2018-03-26 NOTE — Progress Notes (Addendum)
[place]  [phone] [phone]) [phone]     ID: Amy Dawson DOB: [birth year]  MR#: [id number]  JYN#:[id number]  Patient Care Team: [name], MD as PCP - General (Family Medicine) [name], [name], MD as Consulting Physician (Oncology) [name], MD as Consulting Physician (General Surgery) [name], MD as Consulting Physician (Radiation Oncology) [name], NP as Nurse Practitioner (Obstetrics and Gynecology) OTHER MD:  CHIEF COMPLAINT: Triple negative breast cancer  CURRENT TREATMENT: pembrolizumab  HISTORY OF CURRENT ILLNESS: From the original intake note:  Amy Dawson felt some discomfort in the left breast in June 2018, but did not think much of it.  However in October she palpated a change in the upper outer quadrant of her left breast and she brought this to medical attention.  On May 09, 2017 she had bilateral diagnostic mammography with tomography at the breast center, showing the breast density to be category capital C.  In the left breast that was not obscured mass in the upper outer quadrant, which by palpation was identified at the 1230 o'clock radiant 5 cm from the nipple.  By ultrasound this measured 2.9 cm, and it was irregular and hypoechoic.  The left axilla was sonographically benign.  Biopsy of the left breast mass in question May 14, 2017 showed [phone]) and invasive ductal carcinoma, grade 2 or more likely 3, estrogen and progesterone receptor negative, with no HER-2 amplification, the signals ratio being 1.18 and the number per cell 1.65.  The key 67 was 80%.  The patient's subsequent history is as detailed below.  INTERVAL HISTORY: Amy Dawson returns today for follow-up and treatment of her triple negative breast cancer. She is participating in our pembrolizumab SWOG [phone] trial. Today is day 22 cycle 1--day 1 cycle 2.. She tolerates this well.   Amy Dawson underwent biopsy of a new lump in her buttock.  This  has preliminarily demonstrated metastatic breast cancer.  Prognostic panel is pending.  She does note that since her last visit (and treatment), she feels like the lump is smaller.     REVIEW OF SYSTEMS: Amy Dawson also continues to have a lump in her breast.  She notes that it is maybe some smaller.  She is having difficulty sleeping and wants to know if we can give her something to help her sleep.  She denies any other issues such as nausea, vomiting, unusual headaches, vision issues, bowel/bladder changes.  A detailed ROS was otherwise non contributory.   PAST MEDICAL HISTORY: Past Medical History:  Diagnosis Date  . Allergy 1993   Per patient 02/28/18: Diagnosed approximately 1993.  Marland Kitchen Anxiety 1995   Per patient 02/28/18: Diagnosed approximately 1995  . Breast cancer ([facility])   . Chronic kidney disease 2019   bladder issues; Per patient 02/28/18, experiences burning with urination with certain food and beverages (caffeine, spicy foods, etc.).  Experienced for several years until on Keto diet, then symptoms reccured after stopping keto diet in Feb 2019  . Depression 2002   Per patient 02/28/18: diagnosed approximately 2002, does not have bipolar depression.  . Family history of breast cancer   . Fibromyalgia 2002   Per patient 02/28/18: diagnosed approximately 2002  . Genetic testing 06/13/2017   Breast/GYN panel (23 genes) @ [facility] - No pathogenic mutations detected  . Headache 1993   smokes marijuana for migraine pain; Per patient 02/28/18:began in approximately 1993, experiences tension headaches and migraine headaches  . Murmur, cardiac 1978   as a child; Per patient 02/28/18: diagnosed  approximately 1978  . Nausea 1978   Per patient 02/28/18: experiences occassional nausea since about age 21  . Personal history of chemotherapy 2018  . Stroke [facility]) 2000   numbness on left side; Per patient 02/28/18: partial numbness but retains full use of left side, is not limited  . TMJ (dislocation of  temporomandibular joint) 1995   Per patient 02/28/18: diagnosed in approximately 1995    PAST SURGICAL HISTORY: Past Surgical History:  Procedure Laterality Date  . BREAST BIOPSY Left 05/14/2017   malignant  . BREAST LUMPECTOMY WITH AXILLARY LYMPH NODE BIOPSY Left 11/13/2017   Procedure: LEFT BREAST LUMPECTOMY WITH SENTINEL LYMPH NODE BIOPSY;  Surgeon: [name], MD;  Location: [facility];  Service: General;  Laterality: Left;  . CESAREAN SECTION     2  . ENDOMETRIAL ABLATION  2011  . PORTACATH PLACEMENT Right 05/31/2017   Procedure: INSERTION PORT-A-CATH WITH [place];  Surgeon: [name], MD;  Location: [facility];  Service: General;  Laterality: Right;    FAMILY HISTORY Family History  Problem Relation Age of Onset  . Liver cancer Sister        "rare type"; deceased 51  . Breast cancer Maternal Aunt        Dx 82s; currently 62s  . Breast cancer Paternal Grandmother        Dx 8s; deceased 88s  . Cancer Paternal Aunt        unk. type; deceased 79  . Colon cancer Neg Hx   . Esophageal cancer Neg Hx   . Rectal cancer Neg Hx   . Stomach cancer Neg Hx   The patient's parents are in their mid 4s as of November 2018.  The patient had 1 brother, 1 sister.  The sister died from a rare liver cancer at age 64.  A maternal aunt and a paternal grandmother were diagnosed with breast cancer in their 42s.  GYNECOLOGIC HISTORY:  No LMP recorded. Patient has had an ablation. Menarche age 40.  She underwent endometrial ablation 2011.  She is no longer having periods.  She never used hormone replacement.  First live birth was age 34.  She is GX P2.   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  Her husband Amy Dawson works as an Firefighter for [employer]  Daughter Charle Mclaurin attends [employer].  Son Nana Vastine is doing on apprenticeship in the [employer] program.    ADVANCED DIRECTIVES: Not in  place   HEALTH MAINTENANCE: Social History   Tobacco Use  . Smoking status: Former Research scientist (life sciences)  . Smokeless tobacco: Never Used  Substance Use Topics  . Alcohol use: Yes    Alcohol/week: 0.0 standard drinks    Comment: sometimes  . Drug use: Yes    Types: Marijuana    Comment: last smoked 11-10-17 ("for anxiety"), uses daily     Colonoscopy: 2016/[facility]  PAP: October 2018  Bone density: Never   Allergies  Allergen Reactions  . Other Other (See Comments)    Red wine & white cheese: migraine Raw egg whites- hives    Current Outpatient Medications  Medication Sig Dispense Refill  . amitriptyline (ELAVIL) 50 MG tablet Take 25 mg by mouth at bedtime as needed for sleep. Patient taking differently; take 50 mg daily.    Marland Kitchen azelastine (OPTIVAR) 0.05 % ophthalmic solution INSTILL 1 DROP INTO EACH EYE AS NEEDED  1  . Calcium Glycerophosphate (PRELIEF PO) Take 2  tablets by mouth as needed (dysuria).     . CALCIUM PO Take 1 tablet by mouth daily.    . Cetirizine HCl (ZYRTEC ALLERGY PO) Take 10 mg by mouth daily.     Marland Kitchen dimenhyDRINATE (DRAMAMINE) 50 MG tablet Take 50 mg by mouth every 8 (eight) hours as needed for nausea.     . fluticasone (FLONASE) 50 MCG/ACT nasal spray Place 2 sprays into both nostrils daily.    . Multiple Vitamin (MULTIVITAMIN) tablet Take 1 tablet by mouth daily.    . Naproxen Sod-diphenhydrAMINE (ALEVE PM PO) Take by mouth at bedtime.    Marland Kitchen OVER THE COUNTER MEDICATION Take 2 tablets by mouth as needed (excedrine - tension headache; for headache).     . OVER THE COUNTER MEDICATION Take 2 tablets by mouth as needed (Excedrine - pain reliever; for joint pain associated with fibromyalgia (acetaminophen 226m+Asprin 2565mcaffeine 6549m.     . Pseudoephedrine HCl (SUDAFED PO) Take 1 tablet by mouth as needed (psuedoephedrine HCl 18m14m    . Pseudoephedrine-guaiFENesin (MUCINEX D PO) Take 1 tablet by mouth as needed (guaifenesin 1200mg69mudoephedrine HCl 120mg)55m   No  current facility-administered medications for this visit.     OBJECTIVE: Middle-aged white woman who appears stated age  Vitals52/11/19 1230  BP: (!) 105/50  Pulse: (!) 107  Resp: 18  Temp: 98.4 F (36.9 C)  SpO2: 99%     Body mass index is 24.35 kg/m.   Wt Readings from Last 3 Encounters:  03/26/18 163 lb 11.2 oz (74.3 kg)  03/21/18 165 lb (74.8 kg)  03/11/18 174 lb 9.6 oz (79.2 kg)  ECOG FS:1 - Symptomatic but completely ambulatory  GENERAL: Patient is a well appearing female in no acute distress HEENT:  Sclerae anicteric.  Oropharynx clear and moist. No ulcerations or evidence of oropharyngeal candidiasis. Neck is supple.  NODES:  No cervical, supraclavicular, or axillary lymphadenopathy palpated.  BREAST EXAM:  Right breast mass measured with ruler at 4cm, no other nodules or masses noted, left lumpectomy site without nodule, mass, skin or nipple change. LUNGS:  Clear to auscultation bilaterally.  No wheezes or rhonchi. HEART:  Regular rate and rhythm. No murmur appreciated. ABDOMEN:  Soft, nontender.  Positive, normoactive bowel sounds. No organomegaly palpated. MSK:  No focal spinal tenderness to palpation. Full range of motion bilaterally in the upper extremities. EXTREMITIES:  No peripheral edema.   SKIN:  Clear with no obvious rashes or skin changes. No nail dyscrasia. NEURO:  Nonfocal. Well oriented.  Appropriate affect.    LAB RESULTS:  CMP     Component Value Date/Time   NA 138 03/21/2018 0847   NA 138 07/10/2017 1458   K 3.6 03/21/2018 0847   K 3.7 07/10/2017 1458   CL 99 03/21/2018 0847   CO2 27 03/21/2018 0847   CO2 25 07/10/2017 1458   GLUCOSE 126 (H) 03/21/2018 0847   GLUCOSE 95 07/10/2017 1458   BUN 9 03/21/2018 0847   BUN 7.4 07/10/2017 1458   CREATININE 0.84 03/21/2018 0847   CREATININE 0.7 07/10/2017 1458   CALCIUM 9.8 03/21/2018 0847   CALCIUM 8.9 07/10/2017 1458   PROT 7.9 03/21/2018 0847   PROT 7.1 07/10/2017 1458   ALBUMIN 3.6  03/21/2018 0847   ALBUMIN 3.8 07/10/2017 1458   AST 21 03/21/2018 0847   AST 10 07/10/2017 1458   ALT 9 03/21/2018 0847   ALT 11 07/10/2017 1458   ALKPHOS 111 03/21/2018 0847  ALKPHOS 92 07/10/2017 1458   BILITOT 0.4 03/21/2018 0847   BILITOT <0.22 07/10/2017 1458   GFRNONAA >60 03/21/2018 0847   GFRAA >60 03/21/2018 0847    No results found for: Ronnald Ramp, A1GS, A2GS, BETS, BETA2SER, GAMS, MSPIKE, SPEI  No results found for: Nils Pyle, [facility]  Lab Results  Component Value Date   WBC 4.9 03/25/2018   NEUTROABS 3.6 03/21/2018   HGB 13.0 03/25/2018   HCT 37.9 03/25/2018   MCV 92.4 03/25/2018   PLT 291 03/25/2018      Chemistry      Component Value Date/Time   NA 138 03/21/2018 0847   NA 138 07/10/2017 1458   K 3.6 03/21/2018 0847   K 3.7 07/10/2017 1458   CL 99 03/21/2018 0847   CO2 27 03/21/2018 0847   CO2 25 07/10/2017 1458   BUN 9 03/21/2018 0847   BUN 7.4 07/10/2017 1458   CREATININE 0.84 03/21/2018 0847   CREATININE 0.7 07/10/2017 1458      Component Value Date/Time   CALCIUM 9.8 03/21/2018 0847   CALCIUM 8.9 07/10/2017 1458   ALKPHOS 111 03/21/2018 0847   ALKPHOS 92 07/10/2017 1458   AST 21 03/21/2018 0847   AST 10 07/10/2017 1458   ALT 9 03/21/2018 0847   ALT 11 07/10/2017 1458   BILITOT 0.4 03/21/2018 0847   BILITOT <0.22 07/10/2017 1458       No results found for: LABCA2  No components found for: [id number]  Recent Labs  Lab 03/25/18 0934  INR 0.98    No results found for: LABCA2  No results found for: WSF681  No results found for: EXN170  No results found for: YFV494  No results found for: CA2729  No components found for: HGQUANT  No results found for: CEA1 / No results found for: CEA1   No results found for: AFPTUMOR  No results found for: [place]  No results found for: [facility] Outpatient Visit on 03/25/2018  Component Date Value Ref Range Status  . aPTT 03/25/2018 31  24 -  36 seconds Final   Performed at [facility], [place] [address], [place], [place] [address]  . WBC 03/25/2018 4.9  4.0 - 10.5 K/uL Final  . RBC 03/25/2018 4.10  3.87 - 5.11 MIL/uL Final  . Hemoglobin 03/25/2018 13.0  12.0 - 15.0 g/dL Final  . HCT 03/25/2018 37.9  36.0 - 46.0 % Final  . MCV 03/25/2018 92.4  78.0 - 100.0 fL Final  . MCH 03/25/2018 31.7  26.0 - 34.0 pg Final  . MCHC 03/25/2018 34.3  30.0 - 36.0 g/dL Final  . RDW 03/25/2018 12.9  11.5 - 15.5 % Final  . Platelets 03/25/2018 291  150 - 400 K/uL Final   Performed at [facility], [place] [address] [place], [place] [address]  . Prothrombin Time 03/25/2018 12.9  11.4 - 15.2 seconds Final  . INR 03/25/2018 0.98   Final   Performed at [facility], [place] [address], [place], [place] [address]    (this displays the last labs from the last 3 days)  No results found for: TOTALPROTELP, ALBUMINELP, A1GS, A2GS, BETS, BETA2SER, GAMS, MSPIKE, SPEI (this displays SPEP labs)  No results found for: KPAFRELGTCHN, LAMBDASER, KAPLAMBRATIO (kappa/lambda light chains)  No results found for: HGBA, HGBA2QUANT, HGBFQUANT, HGBSQUAN (Hemoglobinopathy evaluation)   No results found for: LDH  No results found for: IRON, TIBC, IRONPCTSAT (Iron and TIBC)  No results found for: FERRITIN  Urinalysis    Component Value Date/Time   COLORURINE YELLOW 07/07/2017 1400   APPEARANCEUR CLEAR 07/07/2017 1400   LABSPEC 1.003 (L) 07/07/2017 1400   LABSPEC 1.010 05/30/2017 1315   PHURINE 7.0 07/07/2017 1400   GLUCOSEU NEGATIVE 07/07/2017 1400   GLUCOSEU Negative 05/30/2017 1315   HGBUR NEGATIVE 07/07/2017 1400   BILIRUBINUR NEGATIVE 07/07/2017 1400   BILIRUBINUR Color Interference 05/30/2017 1315   KETONESUR NEGATIVE 07/07/2017 1400   PROTEINUR NEGATIVE 07/07/2017 1400   UROBILINOGEN Color Interference 05/30/2017 1315   NITRITE NEGATIVE 07/07/2017 1400   LEUKOCYTESUR NEGATIVE 07/07/2017 1400    LEUKOCYTESUR Color Interference 05/30/2017 1315     STUDIES: Mr Pelvis W Wo Contrast  Result Date: 03/21/2018 CLINICAL DATA:  Palpable mass in the right buttock for approximately 2 weeks in a patient with a history of breast cancer. EXAM: MRI PELVIS WITHOUT AND WITH CONTRAST TECHNIQUE: Multiplanar, multisequence MR imaging of the pelvis was performed following the administration of intravenous contrast. CONTRAST:  16 mL MULTIHANCE GADOBENATE DIMEGLUMINE 529 MG/ML IV SOLN COMPARISON:  None. FINDINGS: Bones/Joint/Cartilage Multiple foci of abnormal marrow signal with postcontrast enhancement are identified. The largest single lesion is seen in the proximal diaphysis of the right femur. The lesion fills the medullary space and measures 4.2 cm craniocaudal on image 26 of series 10. A second lesion is in the posterior right acetabulum measuring 1.6 x 1.6 cm in the axial plane on image 30 of series 4. A third index lesion in the anterior most aspect of the left iliac wing measures 1.1 cm AP by 1 cm transverse on image 14 of series 4. No fracture is identified. Ligaments Negative. Muscles and Tendons Intact. Soft tissues A marker is placed in the region of concern over the right buttock. In the deep subcutaneous fatty tissues, there is an enhancing mass which measures 2.9 cm transverse by 2.0 cm in the axial plane on image 17 of series 11. The lesion is contiguous with the gluteus maximus. A second enhancing mass lesion in the subcutaneous fatty tissues of the medial right thigh measures 2.3 by 2.5 cm on image 48 of series 11. IMPRESSION: Multiple foci of abnormal marrow signal and enhancement consistent with metastatic disease. Enhancing subcutaneous lesions in the region of concern in the right buttock and medial aspect of the right thigh are also most consistent with metastatic disease. Electronically Signed   By: Inge Rise M.D.   On: 03/21/2018 08:57   Ct Biopsy  Result Date: 03/25/2018 CLINICAL DATA:   History of left breast carcinoma with recent palpable right gluteal mass and imaging evidence of right gluteal soft tissue, right medial thigh and bone marrow metastases by MRI. Request has been made to sample the right gluteal subcutaneous soft tissue mass. EXAM: CT GUIDED CORE BIOPSY OF BODY PART ANESTHESIA/SEDATION: 2.0 mg IV Versed; 100 mcg IV Fentanyl Total Moderate Sedation Time:  12 minutes. The patient's level of consciousness and physiologic status were continuously monitored during the procedure by Radiology nursing. PROCEDURE: The procedure risks, benefits, and alternatives were explained to the patient. Questions regarding the procedure were encouraged and answered. The patient understands and consents to the procedure. A time-out was performed prior to initiating the procedure. The right gluteal region was prepped with chlorhexidine in a sterile fashion, and a sterile drape was applied covering the operative field. A sterile gown and sterile gloves were used for the procedure. Local anesthesia was provided with 1% Lidocaine. CT was performed in a prone position. Under CT guidance, a  25 gauge trocar needle was advanced to the level of a right gluteal subcutaneous soft tissue mass. Coaxial 18 gauge core biopsy samples were obtained. A total of 4 samples were obtained and submitted in formalin. COMPLICATIONS: None FINDINGS: Oval-shaped soft tissue mass in the right gluteal subcutaneous fat just superficial to the gluteal musculature measures approximately 2.2 x 3.5 cm in greatest transverse dimensions. Solid tissue was obtained with core biopsy. IMPRESSION: CT-guided core biopsy performed of a right gluteal subcutaneous soft tissue mass measuring approximately 3.5 cm in greatest diameter by CT today. Electronically Signed   By: Aletta Edouard M.D.   On: 03/25/2018 12:31    ELIGIBLE FOR AVAILABLE RESEARCH PROTOCOL: SWOG 1308 depending on response to neoadjuvant treatment, UPBEAT  ASSESSMENT: 52 y.o.  [place] status post left breast upper outer quadrant biopsy May 14, 2017 for a clinical T2 N0, stage IIB invasive ductal carcinoma, grade 3, triple negative, with an MIB-1 of 80%  (1) neoadjuvant chemotherapy consisting of doxorubicin and cyclophosphamide in dose dense fashion x4 started 06/04/2017, completed 07/17/2017; followed by weekly carboplatin and paclitaxel x12 started 07/30/2017, completed 10/22/2017  (a) echo on 05/30/2017: LVEF 55-60%  (2) Right lumpectomy and SLNB on 11/13/2017: showed ypT2 ypN0 residual invasive ductal carcinoma, grade 3, repeat prognostic panel again triple negative.  RCB-II  (3) adjuvant radiation with capecitabine sensitizaton  12/16/2017-01/31/2018, she did experience planter erythrodysesthesia with the Capecitabine, so it was not continued for six months after sensitization (total 6 weeks' treatment)   (4) genetics testing offered through [facility] 08/13/2016 showed: No deleterious mutations in ATM, BARD1, BRCA1, BRCA2, BRIP1, CDH1, CHEK2, DICER1, EPCAM*, MLH1, MSH2, MSH6, NBN, NF1, PALB2, PMS2, PTEN, RAD50, RAD51C, RAD51D, SMARCA4, STK11, TP53  (5) participating in [place] S1418, randomized to ARM 2= pembrolizumab Q 21 days, started 02/28/2018  METASTATIC DISEASE Sept 2019 (6) pelvic MRI suggests bone involvement as well as subcutaneous masses in the right buttock  (a) right breast biopsy pending  (b) right buttock mass biopsy 03/25/2018, preliminary result positive for metastatic breast cancer, prognostic panel pending  (c) CT scans of brain chest abdomen and pelvis scheduled for 03/28/2018   (d) bone scan scheduled 04/01/2018  (e) PD-L1 and Foundation One studies on 03/25/2018 sample requested 03/26/2018  PLAN: Jennell is doing moderately well.  We reviewed her preliminary biopsy results.  She met with Dr. [name].  She will undergo restaging scans and bone scan in the next week.  Foundation one and PD-L1 studies have been sent.  We reviewed that we are  hopeful she will be able to continue on Pembrolizumab since it seems like she had a response to it.  We have reached out to the [employer] at [phone] to get help with the [facility] access program.  I also spoke with pharmacy about this request.    I told Arzella that I would call her after we get all of her results back from her scans.  She is appreciative of this.  The working plan is that I will see her back on 04/11/2018 and that she will get Pembrolizumab that day +/- Eribulin depending on the scans.    Nashla will start Lorazepam 0.35m QHSPRN for her sleep.  Dr. MJana Hakimreviewed with her how to take it.    Mickie will return as noted above.  She knows to call for any questions or concerns prior to her next appointment with u[place]       LWilber Bihari NP  03/26/18 12:41 PM Medical Oncology and Hematology [facility]  [place], [place] [address] Tel. [phone]    Fax. [phone]   ADDENDUM: Meaghen's situation is difficult.  She does understand she now has stage IV breast cancer, and that this is not curable.  She understands that the goal of treatment is control and that this strategy is to use the simplest and best tolerated treatments so long as they are effective at controlling the tumor.  I do not believe she is going to be able to stay on the Pember study but we are bagging to see if they will allow her since there appears to be some evidence of response to the minimal treatment with that agent that she has received so far.  She is scheduled for complete staging studies within the next 10 days.  We are also waiting on the prognostic panel from the buttock mass biopsy.  Finally I have requested foundation one and PDL 1 studies from this tissue.  All that should be available in about 10 days  If we cannot keep her in the study then the plan will be to try to obtain embolism out from the drug company and our pharmacy as well as [facility] are both  working on this already  I personally saw this patient and performed a substantive portion of this encounter with the listed APP documented above.   Chauncey Cruel, MD Medical Oncology and Hematology [facility] [address] [place], [place] [address] Tel. [phone]    Fax. [phone]"

## 2018-03-27 ENCOUNTER — Telehealth: Payer: Self-pay | Admitting: Adult Health

## 2018-03-27 ENCOUNTER — Other Ambulatory Visit: Payer: Self-pay | Admitting: Oncology

## 2018-03-27 ENCOUNTER — Ambulatory Visit
Admission: RE | Admit: 2018-03-27 | Discharge: 2018-03-27 | Disposition: A | Discharge status: Home / Self Care | Payer: 59 | Source: Ambulatory Visit | Attending: Oncology | Admitting: Oncology

## 2018-03-27 ENCOUNTER — Other Ambulatory Visit: Payer: Self-pay | Admitting: *Deleted

## 2018-03-27 DIAGNOSIS — Z171 Estrogen receptor negative status [ER-]: Principal | ICD-10-CM

## 2018-03-27 DIAGNOSIS — C50412 Malignant neoplasm of upper-outer quadrant of left female breast: Secondary | ICD-10-CM

## 2018-03-27 DIAGNOSIS — N6310 Unspecified lump in the right breast, unspecified quadrant: Secondary | ICD-10-CM

## 2018-03-27 NOTE — Telephone Encounter (Signed)
Per 9/11 no los °

## 2018-03-28 ENCOUNTER — Ambulatory Visit
Admission: RE | Admit: 2018-03-28 | Discharge: 2018-03-28 | Disposition: A | Discharge status: Home / Self Care | Payer: 59 | Source: Ambulatory Visit | Attending: Oncology | Admitting: Oncology

## 2018-03-28 ENCOUNTER — Telehealth: Payer: Self-pay

## 2018-03-28 ENCOUNTER — Ambulatory Visit (HOSPITAL_COMMUNITY)
Admission: RE | Admit: 2018-03-28 | Discharge: 2018-03-28 | Disposition: A | Discharge status: Home / Self Care | Payer: 59 | Source: Ambulatory Visit | Attending: Oncology | Admitting: Oncology

## 2018-03-28 DIAGNOSIS — C771 Secondary and unspecified malignant neoplasm of intrathoracic lymph nodes: Secondary | ICD-10-CM | POA: Diagnosis not present

## 2018-03-28 DIAGNOSIS — Z171 Estrogen receptor negative status [ER-]: Principal | ICD-10-CM

## 2018-03-28 DIAGNOSIS — R590 Localized enlarged lymph nodes: Secondary | ICD-10-CM | POA: Diagnosis not present

## 2018-03-28 DIAGNOSIS — N6310 Unspecified lump in the right breast, unspecified quadrant: Secondary | ICD-10-CM

## 2018-03-28 DIAGNOSIS — C7951 Secondary malignant neoplasm of bone: Secondary | ICD-10-CM | POA: Diagnosis not present

## 2018-03-28 DIAGNOSIS — C7801 Secondary malignant neoplasm of right lung: Secondary | ICD-10-CM | POA: Insufficient documentation

## 2018-03-28 DIAGNOSIS — C7931 Secondary malignant neoplasm of brain: Secondary | ICD-10-CM | POA: Insufficient documentation

## 2018-03-28 DIAGNOSIS — C50412 Malignant neoplasm of upper-outer quadrant of left female breast: Secondary | ICD-10-CM

## 2018-03-28 MED ORDER — IOHEXOL 300 MG/ML  SOLN
100.0000 mL | Freq: Once | INTRAMUSCULAR | Status: AC | PRN
  Administered 2018-03-28: 100 mL via INTRAVENOUS

## 2018-03-28 NOTE — Telephone Encounter (Signed)
[facility] call to give call report on CT Head.  Will have provider review.

## 2018-03-31 ENCOUNTER — Telehealth: Payer: Self-pay | Admitting: Adult Health

## 2018-03-31 NOTE — Telephone Encounter (Signed)
Called patient about CT scans.  Reviewed her scans and concern for metastatic disease in brain, lung, liver, lymph nodes, and bilateral gluteal regions.  Reviewed with Dr. Lindi Adie, and Dereck Leep PA.  Patient to see Dr. Lisbeth Renshaw on 04/02/18.  Patient declined to come in to review further, or to talk to our chaplain Lorrin Jackson at this time.    Wilber Bihari, NP

## 2018-04-01 ENCOUNTER — Telehealth: Payer: Self-pay | Admitting: Radiation Oncology

## 2018-04-01 ENCOUNTER — Encounter (HOSPITAL_COMMUNITY)
Admission: RE | Admit: 2018-04-01 | Discharge: 2018-04-01 | Disposition: A | Discharge status: Home / Self Care | Payer: 59 | Source: Ambulatory Visit | Attending: Oncology | Admitting: Oncology

## 2018-04-01 ENCOUNTER — Other Ambulatory Visit: Payer: Self-pay | Admitting: Radiation Oncology

## 2018-04-01 DIAGNOSIS — Z171 Estrogen receptor negative status [ER-]: Principal | ICD-10-CM

## 2018-04-01 DIAGNOSIS — C50412 Malignant neoplasm of upper-outer quadrant of left female breast: Secondary | ICD-10-CM

## 2018-04-01 MED ORDER — TECHNETIUM TC 99M MEDRONATE IV KIT
21.3000 | PACK | Freq: Once | INTRAVENOUS | Status: AC | PRN
  Administered 2018-04-01: 21.3 via INTRAVENOUS

## 2018-04-01 NOTE — Telephone Encounter (Signed)
Spoke with Amy Dawson and she is agreeable to any time to get her brain MRI once Amy Dawson is completed. Ok to leave a detailed message on her vcml.

## 2018-04-02 ENCOUNTER — Ambulatory Visit
Admission: RE | Admit: 2018-04-02 | Discharge: 2018-04-02 | Disposition: A | Discharge status: Home / Self Care | Payer: 59 | Source: Ambulatory Visit | Attending: Radiation Oncology | Admitting: Radiation Oncology

## 2018-04-02 ENCOUNTER — Telehealth: Payer: Self-pay | Admitting: Radiation Oncology

## 2018-04-02 DIAGNOSIS — Z79899 Other long term (current) drug therapy: Secondary | ICD-10-CM | POA: Insufficient documentation

## 2018-04-02 DIAGNOSIS — Z923 Personal history of irradiation: Secondary | ICD-10-CM | POA: Diagnosis not present

## 2018-04-02 DIAGNOSIS — M25551 Pain in right hip: Secondary | ICD-10-CM | POA: Diagnosis not present

## 2018-04-02 DIAGNOSIS — C50412 Malignant neoplasm of upper-outer quadrant of left female breast: Secondary | ICD-10-CM | POA: Insufficient documentation

## 2018-04-02 DIAGNOSIS — Z171 Estrogen receptor negative status [ER-]: Secondary | ICD-10-CM | POA: Insufficient documentation

## 2018-04-02 DIAGNOSIS — C7800 Secondary malignant neoplasm of unspecified lung: Secondary | ICD-10-CM

## 2018-04-02 DIAGNOSIS — C7931 Secondary malignant neoplasm of brain: Secondary | ICD-10-CM

## 2018-04-02 DIAGNOSIS — C7951 Secondary malignant neoplasm of bone: Secondary | ICD-10-CM

## 2018-04-02 NOTE — Telephone Encounter (Signed)
LVM for Amy Dawson stating that her brain MRI is now scheduled and gave her my # for return call as needed.

## 2018-04-04 DIAGNOSIS — C50412 Malignant neoplasm of upper-outer quadrant of left female breast: Secondary | ICD-10-CM | POA: Diagnosis not present

## 2018-04-07 ENCOUNTER — Ambulatory Visit
Admission: RE | Admit: 2018-04-07 | Discharge: 2018-04-07 | Disposition: A | Discharge status: Home / Self Care | Payer: 59 | Source: Ambulatory Visit | Attending: Radiation Oncology | Admitting: Radiation Oncology

## 2018-04-07 DIAGNOSIS — C50412 Malignant neoplasm of upper-outer quadrant of left female breast: Secondary | ICD-10-CM | POA: Diagnosis not present

## 2018-04-08 ENCOUNTER — Ambulatory Visit
Admission: RE | Admit: 2018-04-08 | Discharge: 2018-04-08 | Disposition: A | Discharge status: Home / Self Care | Payer: 59 | Source: Ambulatory Visit | Attending: Radiation Oncology | Admitting: Radiation Oncology

## 2018-04-08 ENCOUNTER — Ambulatory Visit (HOSPITAL_COMMUNITY)
Admission: RE | Admit: 2018-04-08 | Discharge: 2018-04-08 | Disposition: A | Discharge status: Home / Self Care | Payer: 59 | Source: Ambulatory Visit | Attending: Radiation Oncology | Admitting: Radiation Oncology

## 2018-04-08 ENCOUNTER — Ambulatory Visit (HOSPITAL_COMMUNITY): Admission: RE | Admit: 2018-04-08 | Payer: 59 | Source: Ambulatory Visit

## 2018-04-08 DIAGNOSIS — Z171 Estrogen receptor negative status [ER-]: Secondary | ICD-10-CM | POA: Diagnosis present

## 2018-04-08 DIAGNOSIS — I610 Nontraumatic intracerebral hemorrhage in hemisphere, subcortical: Secondary | ICD-10-CM | POA: Diagnosis not present

## 2018-04-08 DIAGNOSIS — C50412 Malignant neoplasm of upper-outer quadrant of left female breast: Secondary | ICD-10-CM

## 2018-04-08 DIAGNOSIS — R609 Edema, unspecified: Secondary | ICD-10-CM | POA: Insufficient documentation

## 2018-04-08 DIAGNOSIS — C7931 Secondary malignant neoplasm of brain: Secondary | ICD-10-CM | POA: Insufficient documentation

## 2018-04-08 MED ORDER — GADOBUTROL 1 MMOL/ML IV SOLN
7.5000 mL | Freq: Once | INTRAVENOUS | Status: AC | PRN
  Administered 2018-04-08: 7.5 mL via INTRAVENOUS

## 2018-04-09 ENCOUNTER — Telehealth: Payer: Self-pay

## 2018-04-09 ENCOUNTER — Ambulatory Visit
Admission: RE | Admit: 2018-04-09 | Discharge: 2018-04-09 | Disposition: A | Discharge status: Home / Self Care | Payer: 59 | Source: Ambulatory Visit | Attending: Radiation Oncology | Admitting: Radiation Oncology

## 2018-04-09 ENCOUNTER — Encounter (HOSPITAL_COMMUNITY): Payer: Self-pay | Admitting: Oncology

## 2018-04-09 DIAGNOSIS — C50412 Malignant neoplasm of upper-outer quadrant of left female breast: Secondary | ICD-10-CM | POA: Diagnosis not present

## 2018-04-09 MED ORDER — ONDANSETRON HCL 8 MG PO TABS: 8.0000 mg | ORAL_TABLET | Freq: Three times a day (TID) | ORAL | 1 refills | Status: AC | PRN

## 2018-04-09 NOTE — Telephone Encounter (Signed)
Spoke with pt by phone.  She reports continuous nausea with decreased oral intake.  No emesis, bowels are regular, no elevated temps.  Pt also reports feeling more nodules along her skin surface on right upper arm, left lat breast and right neck. Prescription for zofran has been sent to her pharmacy and appt has been made for her to see Wilber Bihari, NP tomorrow.  Pt is aware and verbalizes understanding.

## 2018-04-09 NOTE — Telephone Encounter (Signed)
Orders for istat crt placed for CT scan

## 2018-04-10 ENCOUNTER — Other Ambulatory Visit: Payer: Self-pay | Admitting: Radiation Oncology

## 2018-04-10 ENCOUNTER — Encounter: Payer: Self-pay | Admitting: Adult Health

## 2018-04-10 ENCOUNTER — Telehealth: Payer: Self-pay | Admitting: Radiation Oncology

## 2018-04-10 ENCOUNTER — Ambulatory Visit
Admission: RE | Admit: 2018-04-10 | Discharge: 2018-04-10 | Disposition: A | Discharge status: Home / Self Care | Payer: 59 | Source: Ambulatory Visit | Attending: Radiation Oncology | Admitting: Radiation Oncology

## 2018-04-10 ENCOUNTER — Inpatient Hospital Stay (HOSPITAL_BASED_OUTPATIENT_CLINIC_OR_DEPARTMENT_OTHER): Payer: 59 | Admitting: Adult Health

## 2018-04-10 VITALS — BP 115/71 | HR 94 | Temp 97.7°F | Resp 19 | Ht 68.75 in | Wt 158.2 lb

## 2018-04-10 DIAGNOSIS — Z9221 Personal history of antineoplastic chemotherapy: Secondary | ICD-10-CM | POA: Diagnosis not present

## 2018-04-10 DIAGNOSIS — Z171 Estrogen receptor negative status [ER-]: Secondary | ICD-10-CM | POA: Diagnosis not present

## 2018-04-10 DIAGNOSIS — C7989 Secondary malignant neoplasm of other specified sites: Secondary | ICD-10-CM

## 2018-04-10 DIAGNOSIS — C50412 Malignant neoplasm of upper-outer quadrant of left female breast: Secondary | ICD-10-CM | POA: Diagnosis not present

## 2018-04-10 DIAGNOSIS — Z923 Personal history of irradiation: Secondary | ICD-10-CM

## 2018-04-10 DIAGNOSIS — Z7189 Other specified counseling: Secondary | ICD-10-CM | POA: Insufficient documentation

## 2018-04-10 DIAGNOSIS — Z87891 Personal history of nicotine dependence: Secondary | ICD-10-CM

## 2018-04-10 DIAGNOSIS — Z5112 Encounter for antineoplastic immunotherapy: Secondary | ICD-10-CM | POA: Diagnosis not present

## 2018-04-10 MED ORDER — DEXAMETHASONE 2 MG PO TABS: 2.0000 mg | ORAL_TABLET | Freq: Two times a day (BID) | ORAL | 0 refills | Status: AC

## 2018-04-10 NOTE — Telephone Encounter (Signed)
LM for pt regarding result discussion

## 2018-04-10 NOTE — Progress Notes (Signed)
OFF PATHWAY REGIMEN - Breast  No Change  Continue With Treatment as Ordered.   OFF01014:Carboplatin + Paclitaxel (2/80) weekly:   Administer weekly:     Paclitaxel      Carboplatin   **Always confirm dose/schedule in your pharmacy ordering system**  Patient Characteristics: Preoperative or Nonsurgical Candidate (Clinical Staging), Neoadjuvant Therapy followed by Surgery, Invasive Disease, Chemotherapy, HER2 Negative/Unknown/Equivocal, ER Negative/Unknown, Platinum Therapy Indicated Therapeutic Status: Preoperative or Nonsurgical Candidate (Clinical Staging) AJCC M Category: cM0 AJCC Grade: G3 Breast Surgical Plan: Neoadjuvant Therapy followed by Surgery ER Status: Negative (-) AJCC 8 Stage Grouping: IIB HER2 Status: Negative (-) AJCC T Category: cT2 AJCC N Category: cN0 PR Status: Negative (-) Type of Therapy: Platinum Therapy Indicated Intent of Therapy: Curative Intent, Discussed with Patient

## 2018-04-10 NOTE — Progress Notes (Addendum)
[place]  Telephone:(336) [phone] Fax:(336) [phone]     ID: Amy Dawson DOB: 04-05-1966  MR#: [id number]  ERX#:[id number]  Patient Care Team: Amy Freeze, MD as PCP - General (Family Medicine) [name], [name], MD as Consulting Physician (Oncology) Amy Bookbinder, MD as Consulting Physician (General Surgery) Amy Rudd, MD as Consulting Physician (Radiation Oncology) Amy Gully, NP as Nurse Practitioner (Obstetrics and Gynecology) OTHER MD:  CHIEF COMPLAINT: Triple negative breast cancer  CURRENT TREATMENT: pembrolizumab  HISTORY OF CURRENT ILLNESS: From the original intake note:  Amy Dawson felt some discomfort in the left breast in June 2018, but did not think much of it.  However in October she palpated a change in the upper outer quadrant of her left breast and she brought this to medical attention.  On May 09, 2017 she had bilateral diagnostic mammography with tomography at the breast center, showing the breast density to be category capital C.  In the left breast that was not obscured mass in the upper outer quadrant, which by palpation was identified at the 1230 o'clock radiant 5 cm from the nipple.  By ultrasound this measured 2.9 cm, and it was irregular and hypoechoic.  The left axilla was sonographically benign.  Biopsy of the left breast mass in question May 14, 2017 showed [id number]) and invasive ductal carcinoma, grade 2 or more likely 3, estrogen and progesterone receptor negative, with no HER-2 amplification, the signals ratio being 1.18 and the number per cell 1.65.  The key 67 was 80%.  The patient's subsequent history is as detailed below.  INTERVAL HISTORY: Amy Dawson is here for urgent evaluation of her triple negative breast cancer today because several new skin spots have appeared on her skin.  Over the past two weeks these are getting worse.  She now has masses on her inner thigh, posterior thigh, right upper arm, right  posterior neck, left breast, and abdomen.  These are not particularly painful, except the one on her abdomen is sore, and she has been more nasueated that previous.   REVIEW OF SYSTEMS: Amy Dawson is fatigued.  She feels like her whole body hurts.  She isn't having shortness of breath, cough or chest pain.  She denies any headaches or vision changes.  She denies any other issues today and a detailed ROS was otherwise non contributory.     PAST MEDICAL HISTORY: Past Medical History:  Diagnosis Date  . Allergy 1993   Per patient 02/28/18: Diagnosed approximately 1993.  Marland Kitchen Anxiety 1995   Per patient 02/28/18: Diagnosed approximately 1995  . Breast cancer ([place])   . Chronic kidney disease 2019   bladder issues; Per patient 02/28/18, experiences burning with urination with certain food and beverages (caffeine, spicy foods, etc.).  Experienced for several years until on Keto diet, then symptoms reccured after stopping keto diet in Feb 2019  . Depression 2002   Per patient 02/28/18: diagnosed approximately 2002, does not have bipolar depression.  . Family history of breast cancer   . Fibromyalgia 2002   Per patient 02/28/18: diagnosed approximately 2002  . Genetic testing 06/13/2017   Breast/GYN panel (23 genes) @ [facility] - No pathogenic mutations detected  . Headache 1993   smokes marijuana for migraine pain; Per patient 02/28/18:began in approximately 1993, experiences tension headaches and migraine headaches  . Murmur, cardiac 1978   as a child; Per patient 02/28/18: diagnosed approximately 1978  . Nausea 1978   Per patient 02/28/18: experiences occassional nausea since about age 53  .  Personal history of chemotherapy 2018  . Stroke [facility]) 2000   numbness on left side; Per patient 02/28/18: partial numbness but retains full use of left side, is not limited  . TMJ (dislocation of temporomandibular joint) 1995   Per patient 02/28/18: diagnosed in approximately 1995    PAST SURGICAL HISTORY: Past  Surgical History:  Procedure Laterality Date  . BREAST BIOPSY Left 05/14/2017   malignant  . BREAST LUMPECTOMY WITH AXILLARY LYMPH NODE BIOPSY Left 11/13/2017   Procedure: LEFT BREAST LUMPECTOMY WITH SENTINEL LYMPH NODE BIOPSY;  Surgeon: Amy Bookbinder, MD;  Location: [facility];  Service: General;  Laterality: Left;  . CESAREAN SECTION     2  . ENDOMETRIAL ABLATION  2011  . PORTACATH PLACEMENT Right 05/31/2017   Procedure: INSERTION PORT-A-CATH WITH [place];  Surgeon: Amy Bookbinder, MD;  Location: [facility];  Service: General;  Laterality: Right;    FAMILY HISTORY Family History  Problem Relation Age of Onset  . Liver cancer Sister        "rare type"; deceased 10  . Breast cancer Maternal Aunt        Dx 85s; currently 51s  . Breast cancer Paternal Grandmother        Dx 1s; deceased 48s  . Cancer Paternal Aunt        unk. type; deceased 55  . Colon cancer Neg Hx   . Esophageal cancer Neg Hx   . Rectal cancer Neg Hx   . Stomach cancer Neg Hx   The patient's parents are in their mid 65s as of November 2018.  The patient had 1 brother, 1 sister.  The sister died from a rare liver cancer at age 65.  A maternal aunt and a paternal grandmother were diagnosed with breast cancer in their 12s.  GYNECOLOGIC HISTORY:  No LMP recorded. Patient has had an ablation. Menarche age 39.  She underwent endometrial ablation 2011.  She is no longer having periods.  She never used hormone replacement.  First live birth was age 46.  She is GX P2.   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  Her husband Amy Dawson works as an Firefighter for [employer]  Daughter Amy Dawson attends [employer].  Son Amy Dawson is doing on apprenticeship in the [employer] program.    ADVANCED DIRECTIVES: Not in place   HEALTH MAINTENANCE: Social History   Tobacco Use  . Smoking status: Former Research scientist (life sciences)  . Smokeless tobacco:  Never Used  Substance Use Topics  . Alcohol use: Yes    Alcohol/week: 0.0 standard drinks    Comment: sometimes  . Drug use: Yes    Types: Marijuana    Comment: last smoked 11-10-17 ("for anxiety"), uses daily     Colonoscopy: 2016/[facility]  PAP: October 2018  Bone density: Never   Allergies  Allergen Reactions  . Other Other (See Comments)    Red wine & white cheese: migraine Raw egg whites- hives    Current Outpatient Medications  Medication Sig Dispense Refill  . amitriptyline (ELAVIL) 50 MG tablet Take 25 mg by mouth at bedtime as needed for sleep. Patient taking differently; take 50 mg daily.    Marland Kitchen azelastine (OPTIVAR) 0.05 % ophthalmic solution INSTILL 1 DROP INTO EACH EYE AS NEEDED  1  . Calcium Glycerophosphate (PRELIEF PO) Take 2 tablets by mouth as needed (dysuria).     . CALCIUM PO Take 1 tablet by mouth daily.    Marland Kitchen  Cetirizine HCl (ZYRTEC ALLERGY PO) Take 10 mg by mouth daily.     . Cholecalciferol (VITAMIN D PO) Take by mouth. 600 units daily    . dimenhyDRINATE (DRAMAMINE) 50 MG tablet Take 50 mg by mouth every 8 (eight) hours as needed for nausea.     . fluticasone (FLONASE) 50 MCG/ACT nasal spray Place 2 sprays into both nostrils daily.    Marland Kitchen LORazepam (ATIVAN) 0.5 MG tablet Take 1 tablet (0.5 mg total) by mouth at bedtime. 30 tablet 0  . Multiple Vitamin (MULTIVITAMIN) tablet Take 1 tablet by mouth daily.    . Naproxen Sod-diphenhydrAMINE (ALEVE PM PO) Take by mouth at bedtime.    . ondansetron (ZOFRAN) 8 MG tablet Take 1 tablet (8 mg total) by mouth every 8 (eight) hours as needed for nausea or vomiting. 30 tablet 1  . OVER THE COUNTER MEDICATION Take 2 tablets by mouth as needed (excedrine - tension headache; for headache).     . OVER THE COUNTER MEDICATION Take 2 tablets by mouth as needed (Excedrine - pain reliever; for joint pain associated with fibromyalgia (acetaminophen 273m+Asprin 2566mcaffeine 657m.     . Pseudoephedrine HCl (SUDAFED PO) Take 1 tablet by  mouth as needed (psuedoephedrine HCl 53m56m    . Pseudoephedrine-guaiFENesin (MUCINEX D PO) Take 1 tablet by mouth as needed (guaifenesin 1200mg70mudoephedrine HCl 120mg)1m   No current facility-administered medications for this visit.     OBJECTIVE: Middle-aged white woman who appears stated age  Vitals60/26/19 1459  BP: 115/71  Pulse: 94  Resp: 19  Temp: 97.7 F (36.5 C)  SpO2: 97%     Body mass index is 23.53 kg/m.   Wt Readings from Last 3 Encounters:  04/10/18 158 lb 3.2 oz (71.8 kg)  03/26/18 163 lb 11.2 oz (74.3 kg)  03/21/18 165 lb (74.8 kg)  ECOG FS:1 - Symptomatic but completely ambulatory  GENERAL: Patient is a well appearing female in no acute distress HEENT:  Sclerae anicteric.  Oropharynx clear and moist. No ulcerations or evidence of oropharyngeal candidiasis. Neck is supple.  NODES:  No cervical, supraclavicular, or axillary lymphadenopathy palpated.  BREAST EXAM:  Right breast mass 9cm increased from 4cm on 9/11 LUNGS:  Clear to auscultation bilaterally.  No wheezes or rhonchi. HEART:  Regular rate and rhythm. No murmur appreciated. ABDOMEN:  Soft, nontender.  Positive, normoactive bowel sounds. No organomegaly palpated. MSK:  No focal spinal tenderness to palpation. Full range of motion bilaterally in the upper extremities. EXTREMITIES:  No peripheral edema.   SKIN:  Subcutaneous masses noted on right inner thigh, right posterior thigh, right upper arm just above bicep, right posterior neck, abdomen (gluteal masses remain, just not examined). NEURO:  Nonfocal. Well oriented.  Appropriate affect.    LAB RESULTS:  CMP     Component Value Date/Time   NA 138 03/21/2018 0847   NA 138 07/10/2017 1458   K 3.6 03/21/2018 0847   K 3.7 07/10/2017 1458   CL 99 03/21/2018 0847   CO2 27 03/21/2018 0847   CO2 25 07/10/2017 1458   GLUCOSE 126 (H) 03/21/2018 0847   GLUCOSE 95 07/10/2017 1458   BUN 9 03/21/2018 0847   BUN 7.4 07/10/2017 1458   CREATININE  0.84 03/21/2018 0847   CREATININE 0.7 07/10/2017 1458   CALCIUM 9.8 03/21/2018 0847   CALCIUM 8.9 07/10/2017 1458   PROT 7.9 03/21/2018 0847   PROT 7.1 07/10/2017 1458   ALBUMIN 3.6 03/21/2018 0847  ALBUMIN 3.8 07/10/2017 1458   AST 21 03/21/2018 0847   AST 10 07/10/2017 1458   ALT 9 03/21/2018 0847   ALT 11 07/10/2017 1458   ALKPHOS 111 03/21/2018 0847   ALKPHOS 92 07/10/2017 1458   BILITOT 0.4 03/21/2018 0847   BILITOT <0.22 07/10/2017 1458   GFRNONAA >60 03/21/2018 0847   GFRAA >60 03/21/2018 0847    No results found for: Ronnald Ramp, A1GS, A2GS, BETS, BETA2SER, GAMS, MSPIKE, SPEI  No results found for: Nils Pyle, [facility]  Lab Results  Component Value Date   WBC 4.9 03/25/2018   NEUTROABS 3.6 03/21/2018   HGB 13.0 03/25/2018   HCT 37.9 03/25/2018   MCV 92.4 03/25/2018   PLT 291 03/25/2018      Chemistry      Component Value Date/Time   NA 138 03/21/2018 0847   NA 138 07/10/2017 1458   K 3.6 03/21/2018 0847   K 3.7 07/10/2017 1458   CL 99 03/21/2018 0847   CO2 27 03/21/2018 0847   CO2 25 07/10/2017 1458   BUN 9 03/21/2018 0847   BUN 7.4 07/10/2017 1458   CREATININE 0.84 03/21/2018 0847   CREATININE 0.7 07/10/2017 1458      Component Value Date/Time   CALCIUM 9.8 03/21/2018 0847   CALCIUM 8.9 07/10/2017 1458   ALKPHOS 111 03/21/2018 0847   ALKPHOS 92 07/10/2017 1458   AST 21 03/21/2018 0847   AST 10 07/10/2017 1458   ALT 9 03/21/2018 0847   ALT 11 07/10/2017 1458   BILITOT 0.4 03/21/2018 0847   BILITOT <0.22 07/10/2017 1458       No results found for: LABCA2  No components found for: [id number]  No results for input(s): INR in the last 168 hours.  No results found for: LABCA2  No results found for: TDS287  No results found for: GOT157  No results found for: WIO035  No results found for: CA2729  No components found for: HGQUANT  No results found for: CEA1 / No results found for: CEA1   No results found  for: AFPTUMOR  No results found for: CHROMOGRNA  No results found for: PSA1  No visits with results within 3 Day(s) from this visit.  Latest known visit with results is:  Hospital Outpatient Visit on 03/25/2018  Component Date Value Ref Range Status  . aPTT 03/25/2018 31  24 - 36 seconds Final   Performed at [facility], [place] [address], [place], [place] [address]  . WBC 03/25/2018 4.9  4.0 - 10.5 K/uL Final  . RBC 03/25/2018 4.10  3.87 - 5.11 MIL/uL Final  . Hemoglobin 03/25/2018 13.0  12.0 - 15.0 g/dL Final  . HCT 03/25/2018 37.9  36.0 - 46.0 % Final  . MCV 03/25/2018 92.4  78.0 - 100.0 fL Final  . MCH 03/25/2018 31.7  26.0 - 34.0 pg Final  . MCHC 03/25/2018 34.3  30.0 - 36.0 g/dL Final  . RDW 03/25/2018 12.9  11.5 - 15.5 % Final  . Platelets 03/25/2018 291  150 - 400 K/uL Final   Performed at [facility], [place] [address] [place], [place] [address]  . Prothrombin Time 03/25/2018 12.9  11.4 - 15.2 seconds Final  . INR 03/25/2018 0.98   Final   Performed at [facility], [place] [address], [place], [place] [address]    (this displays the last labs from the last 3 days)  No results found for: TOTALPROTELP, ALBUMINELP, A1GS, A2GS, BETS, BETA2SER, GAMS,  MSPIKE, SPEI (this displays SPEP labs)  No results found for: KPAFRELGTCHN, LAMBDASER, KAPLAMBRATIO (kappa/lambda light chains)  No results found for: HGBA, HGBA2QUANT, HGBFQUANT, HGBSQUAN (Hemoglobinopathy evaluation)   No results found for: LDH  No results found for: IRON, TIBC, IRONPCTSAT (Iron and TIBC)  No results found for: FERRITIN  Urinalysis    Component Value Date/Time   COLORURINE YELLOW 07/07/2017 1400   APPEARANCEUR CLEAR 07/07/2017 1400   LABSPEC 1.003 (L) 07/07/2017 1400   LABSPEC 1.010 05/30/2017 1315   PHURINE 7.0 07/07/2017 1400   GLUCOSEU NEGATIVE 07/07/2017 1400   GLUCOSEU Negative 05/30/2017 1315   HGBUR NEGATIVE 07/07/2017 1400    BILIRUBINUR NEGATIVE 07/07/2017 1400   BILIRUBINUR Color Interference 05/30/2017 1315   KETONESUR NEGATIVE 07/07/2017 1400   PROTEINUR NEGATIVE 07/07/2017 1400   UROBILINOGEN Color Interference 05/30/2017 1315   NITRITE NEGATIVE 07/07/2017 1400   LEUKOCYTESUR NEGATIVE 07/07/2017 1400   LEUKOCYTESUR Color Interference 05/30/2017 1315     STUDIES:   ELIGIBLE FOR AVAILABLE RESEARCH PROTOCOL: SWOG 1418 depending on response to neoadjuvant treatment, UPBEAT  ASSESSMENT: 52 y.o. Amy Dawson status post left breast upper outer quadrant biopsy May 14, 2017 for a clinical T2 N0, stage IIB invasive ductal carcinoma, grade 3, triple negative, with an MIB-1 of 80%  (1) neoadjuvant chemotherapy consisting of doxorubicin and cyclophosphamide in dose dense fashion x4 started 06/04/2017, completed 07/17/2017; followed by weekly carboplatin and paclitaxel x12 started 07/30/2017, completed 10/22/2017  (a) echo on 05/30/2017: LVEF 55-60%  (2) Right lumpectomy and SLNB on 11/13/2017: showed ypT2 ypN0 residual invasive ductal carcinoma, grade 3, repeat prognostic panel again triple negative.  RCB-II  (3) adjuvant radiation with capecitabine sensitizaton  12/16/2017-01/31/2018, she did experience planter erythrodysesthesia with the Capecitabine, so it was not continued for six months after sensitization (total 6 weeks' treatment)   (4) genetics testing offered through [facility] 08/13/2016 showed: No deleterious mutations in ATM, BARD1, BRCA1, BRCA2, BRIP1, CDH1, CHEK2, DICER1, EPCAM*, MLH1, MSH2, MSH6, NBN, NF1, PALB2, PMS2, PTEN, RAD50, RAD51C, RAD51D, SMARCA4, STK11, TP53  (5) participating in [place] S1418, randomized to ARM 2= pembrolizumab Q 21 days, started 02/28/2018  METASTATIC DISEASE Sept 2019 (6) pelvic MRI suggests bone involvement as well as subcutaneous masses in the right buttock  (a) right breast biopsy pending  (b) right buttock mass biopsy 03/25/2018, preliminary result positive for metastatic  breast cancer, prognostic panel pending  (c) CT scans of brain chest abdomen and pelvis scheduled for 03/28/2018   (d) bone scan scheduled 04/01/2018  (e) PD-L1 and Foundation One studies on 03/25/2018 sample requested 03/26/2018  PLAN: Amy Dawson cancer is spreading rapidly and I am very concerned about this.  We reviewed next of kin and advanced directives today.  She tells me that her husband will be making decisions for her should she not be in position to make them herself.    Plan as per Dr. Geralyn Flash addendum to start Eribulin Monday, 04/21/2018.  I will review her case with Dr. [name] next week.  He will likely work her in next week as well.    Amy Dawson will return as noted above.  She knows to call for any questions or concerns prior to her next appointment with [place].       Amy Bihari, NP  04/10/18 3:17 PM Medical Oncology and Hematology [facility] [address] [place], [place] [address] Tel. [phone]    Fax. [phone]  Attending Note  I personally saw and examined Amy Dawson. The plan of care was discussed  with her. I agree with the assessment and plan as documented above. I spent some time with Amy Dawson discussing the fact that she has very aggressive breast cancer that has been progressing through multiple lines of therapy.  It is a poor prognosis situation and one where she could die within a short period of time for metastatic breast cancer.  I recommended starting systemic chemotherapy with Halaven.  She understands fairly well that in spite of her chemo, she could die from her disease.  We initially wanted to treat her even while she is getting whole brain radiation therapy.  But after having discussions with Dr. Lisbeth Renshaw it was felt that we should wait for the radiation to be completed.  Dr. Lisbeth Renshaw is planning on adding radiation therapy to the breast as well to palliate the recently noted progression of disease in the breast. We will request for prior  authorization for her chemo change to Halaven. Signed Harriette Ohara, MD

## 2018-04-10 NOTE — Progress Notes (Signed)
DISCONTINUE OFF PATHWAY REGIMEN - Breast   OFF01014:Carboplatin + Paclitaxel (2/80) weekly:   Administer weekly:     Paclitaxel      Carboplatin   **Always confirm dose/schedule in your pharmacy ordering system**  REASON: Disease Progression PRIOR TREATMENT: Off Pathway: Carboplatin + Paclitaxel (2/80) weekly TREATMENT RESPONSE: Progressive Disease (PD)  START OFF PATHWAY REGIMEN - Breast   [id number]:Eribulin 1.4 mg/m2 D1, 8 q21 Days:   A cycle is every 21 days:     Eribulin mesylate   **Always confirm dose/schedule in your pharmacy ordering system**  Patient Characteristics: Distant Metastases or Locoregional Recurrent Disease - Unresected, HER2 Negative/Unknown/Equivocal, ER Negative/Unknown, Chemotherapy, First Line, ER Negative, PD-L1 Expression Negative/Unknown Therapeutic Status: Distant Metastases BRCA Mutation Status: Absent ER Status: Negative (-) HER2 Status: Negative (-) PR Status: Negative (-) Line of Therapy: First Line PD-L1 Expression Status: PD-L1 Negative Intent of Therapy: Non-Curative / Palliative Intent, Discussed with Patient

## 2018-04-11 ENCOUNTER — Ambulatory Visit
Admission: RE | Admit: 2018-04-11 | Discharge: 2018-04-11 | Disposition: A | Discharge status: Home / Self Care | Payer: 59 | Source: Ambulatory Visit | Attending: Radiation Oncology | Admitting: Radiation Oncology

## 2018-04-11 ENCOUNTER — Ambulatory Visit: Payer: 59

## 2018-04-11 ENCOUNTER — Other Ambulatory Visit: Payer: 59

## 2018-04-11 ENCOUNTER — Telehealth: Payer: Self-pay | Admitting: Radiation Oncology

## 2018-04-11 ENCOUNTER — Telehealth: Payer: Self-pay | Admitting: Adult Health

## 2018-04-11 DIAGNOSIS — C7981 Secondary malignant neoplasm of breast: Secondary | ICD-10-CM

## 2018-04-11 DIAGNOSIS — C50412 Malignant neoplasm of upper-outer quadrant of left female breast: Secondary | ICD-10-CM | POA: Diagnosis not present

## 2018-04-11 MED ORDER — OXYCODONE-ACETAMINOPHEN 5-325 MG PO TABS: 1.0000 | ORAL_TABLET | ORAL | 0 refills | Status: DC | PRN

## 2018-04-11 NOTE — Telephone Encounter (Signed)
Called patient twice per 9/27 sch message - unable to reach patient and unable to leave message.  Message sent to [facility]

## 2018-04-11 NOTE — Telephone Encounter (Signed)
We have been discussing the patient's case with medical oncology and there is concern for significant progression of disease in the right breast and the need to start systemic therapy very soon. She is to finish her treatment to the chest, whole brain, and right femur/hip area on 10/4. We met with the patient too following her treatment and indeed her breast mass on the right is larger than on prior CT imaging just a few weeks ago. It is not erroding the skin, but is causing symptoms. She is also having intractable nausea despite Zofran. We discussed with Dr. Lindi Adie who plans to start Halaven. She will begin dexamethasone 2 mg BID and was offered a 20 Gy/5 fxn course to the right breast in order to help symptoms, but to complete treatment by 04/21/18 for chemotherapy.

## 2018-04-14 ENCOUNTER — Ambulatory Visit
Admission: RE | Admit: 2018-04-14 | Discharge: 2018-04-14 | Disposition: A | Discharge status: Home / Self Care | Payer: 59 | Source: Ambulatory Visit | Attending: Radiation Oncology | Admitting: Radiation Oncology

## 2018-04-14 ENCOUNTER — Telehealth: Payer: Self-pay | Admitting: Oncology

## 2018-04-14 ENCOUNTER — Other Ambulatory Visit: Payer: Self-pay | Admitting: Oncology

## 2018-04-14 ENCOUNTER — Encounter: Payer: Self-pay | Admitting: Oncology

## 2018-04-14 ENCOUNTER — Ambulatory Visit: Payer: 59 | Admitting: Radiation Oncology

## 2018-04-14 ENCOUNTER — Ambulatory Visit (HOSPITAL_BASED_OUTPATIENT_CLINIC_OR_DEPARTMENT_OTHER): Payer: 59 | Admitting: Oncology

## 2018-04-14 DIAGNOSIS — Z171 Estrogen receptor negative status [ER-]: Secondary | ICD-10-CM

## 2018-04-14 DIAGNOSIS — C7989 Secondary malignant neoplasm of other specified sites: Secondary | ICD-10-CM

## 2018-04-14 DIAGNOSIS — Z5112 Encounter for antineoplastic immunotherapy: Secondary | ICD-10-CM | POA: Diagnosis not present

## 2018-04-14 DIAGNOSIS — Z87891 Personal history of nicotine dependence: Secondary | ICD-10-CM

## 2018-04-14 DIAGNOSIS — C50412 Malignant neoplasm of upper-outer quadrant of left female breast: Secondary | ICD-10-CM | POA: Diagnosis not present

## 2018-04-14 DIAGNOSIS — Z923 Personal history of irradiation: Secondary | ICD-10-CM

## 2018-04-14 DIAGNOSIS — Z9221 Personal history of antineoplastic chemotherapy: Secondary | ICD-10-CM

## 2018-04-14 NOTE — Progress Notes (Signed)
[place]  [phone] [phone]) [phone]     ID: Amy Dawson DOB: Jun 09, 1966  MR#: [id number]  CSN#:[id number]  Patient Care Team: Amy Freeze, MD as PCP - General (Family Medicine) Amy Dawson, Amy Dad, MD as Consulting Physician (Oncology) Amy Bookbinder, MD as Consulting Physician (General Surgery) Amy Rudd, MD as Consulting Physician (Radiation Oncology) Amy Gully, NP as Nurse Practitioner (Obstetrics and Gynecology) OTHER MD:  CHIEF COMPLAINT: Triple negative breast cancer  CURRENT TREATMENT: to resume pembrolizumab, and start eribulin  HISTORY OF CURRENT ILLNESS: From the original intake note:  Amy Dawson felt some discomfort in the left breast in June 2018, but did not think much of it.  However in October she palpated a change in the upper outer quadrant of her left breast and she brought this to medical attention.  On May 09, 2017 she had bilateral diagnostic mammography with tomography at the breast center, showing the breast density to be category capital C.  In the left breast that was not obscured mass in the upper outer quadrant, which by palpation was identified at the 1230 o'clock radiant 5 cm from the nipple.  By ultrasound this measured 2.9 cm, and it was irregular and hypoechoic.  The left axilla was sonographically benign.  Biopsy of the left breast mass in question May 14, 2017 showed [phone]) and invasive ductal carcinoma, grade 2 or more likely 3, estrogen and progesterone receptor negative, with no HER-2 amplification, the signals ratio being 1.18 and the number per cell 1.65.  The key 67 was 80%.  The patient's subsequent history is as detailed below.  INTERVAL HISTORY: Amy Dawson returns today for follow up and treatment of her triple negative and newly metastatic breast cancer.  Since her last visit with me there have been multiple developments, the option of being recurrent widely disseminated aggressive breast  cancer, now with brain involvement.  She presented for diagnostic mammography with tomo and right ultrasonography at [facility] on 03/27/2018 for a new palpable abnormality in the right breast. The breast density was category B. There was a suspicious mass in the upper inner quadrant measuring 1.6 x 3.2 x 4.1 cm and likely malignant. Ultrasound of the right axilla showed a subcutaneous mass over the high right axilla measuring 1.6 cm in greatest diameter, likely metastatic disease.   Biopsy of the right upper inner mass and right axillary mass on 03/28/2018 [phone]) showed: poorly differentiated carcinoma. Repeat prognostic profile was significant for estrogen and progesterone receptor negative with a Ki67 of 20%. HER2 immunohistochemistry was negative (0).   She completed a head CT on 03/28/2018 showing: 12 brain metastases measuring up to 17 mm in the left frontal lobe, marked on series 7. Mild to no vasogenic edema. No shift or herniation.  CT chest, abdomen, and pelvis on 03/28/2018 showed: Metastatic lymphadenopathy in the mediastinum and both hila associated with numerous bilateral pulmonary metastatic nodules and masses. Heterogeneously enhancing right breast mass with mild right axillary lymphadenopathy. 3.6 cm lesion posterior right liver consistent with metastatic involvement. Subcutaneous lesions in the gluteal regions bilaterally consistent with metastases. 6 mm retroperitoneal nodule in the right abdomen suspicious for metastatic disease. Previous pelvis MRI documented multiple bony metastases, but these are not evident by CT.  Aortic Atherosclerois (ICD10-170.0)  Whole body bone scan on 04/02/2018 showed: Abnormal activity in the left iliac bone and right proximal femur consistent with metastatic disease. Review of the prior CT scan does demonstrate a subtle 8 mm lytic lesion in the left  iliac bone in the area of abnormal activity on bone scan. The area of abnormal activity in the  proximal right femur is not completely included on the prior CT scan but the visualized portion appears normal.  Brain MRI on 04/09/2018 showed: Approximately 40 brain metastases. Increased, moderate edema in the left frontal lobe without mass effect. Remote right basal ganglia hemorrhage  She was evaluated by the brain tumor team and is starting radiation treatments to the whole brain today, with 5 treatments planned.  REVIEW OF SYSTEMS: Amy Dawson is understandably distressed by her new situation, but she remains hopeful that we will be able to make some progress.  "I am not ready to give up".  She has not told her children about all the new developments.  She denies severe headaches, visual changes, nausea or vomiting at present.  She says that she finds "new lumps" in her body almost every day.  They seem to become smaller after she finds them she says, but she is aware this may be inaccurate.  She is still doing all her activities of daily living.   PAST MEDICAL HISTORY: Past Medical History:  Diagnosis Date  . Allergy 1993   Per patient 02/28/18: Diagnosed approximately 1993.  Marland Kitchen Anxiety 1995   Per patient 02/28/18: Diagnosed approximately 1995  . Breast cancer ([place])   . Chronic kidney disease 2019   bladder issues; Per patient 02/28/18, experiences burning with urination with certain food and beverages (caffeine, spicy foods, etc.).  Experienced for several years until on Keto diet, then symptoms reccured after stopping keto diet in Feb 2019  . Depression 2002   Per patient 02/28/18: diagnosed approximately 2002, does not have bipolar depression.  . Family history of breast cancer   . Fibromyalgia 2002   Per patient 02/28/18: diagnosed approximately 2002  . Genetic testing 06/13/2017   Breast/GYN panel (23 genes) @ [facility] - No pathogenic mutations detected  . Headache 1993   smokes marijuana for migraine pain; Per patient 02/28/18:began in approximately 1993, experiences tension headaches and  migraine headaches  . Murmur, cardiac 1978   as a child; Per patient 02/28/18: diagnosed approximately 1978  . Nausea 1978   Per patient 02/28/18: experiences occassional nausea since about age 67  . Personal history of chemotherapy 2018  . Stroke [facility]) 2000   numbness on left side; Per patient 02/28/18: partial numbness but retains full use of left side, is not limited  . TMJ (dislocation of temporomandibular joint) 1995   Per patient 02/28/18: diagnosed in approximately 1995    PAST SURGICAL HISTORY: Past Surgical History:  Procedure Laterality Date  . BREAST BIOPSY Left 05/14/2017   malignant  . BREAST LUMPECTOMY WITH AXILLARY LYMPH NODE BIOPSY Left 11/13/2017   Procedure: LEFT BREAST LUMPECTOMY WITH SENTINEL LYMPH NODE BIOPSY;  Surgeon: Amy Bookbinder, MD;  Location: [place];  Service: General;  Laterality: Left;  . CESAREAN SECTION     2  . ENDOMETRIAL ABLATION  2011  . PORTACATH PLACEMENT Right 05/31/2017   Procedure: INSERTION PORT-A-CATH WITH [place];  Surgeon: Amy Bookbinder, MD;  Location: [place];  Service: General;  Laterality: Right;    FAMILY HISTORY Family History  Problem Relation Age of Onset  . Liver cancer Sister        "rare type"; deceased 42  . Breast cancer Maternal Aunt        Dx 57s; currently 69s  . Breast cancer Paternal Grandmother  Dx 76s; deceased 35s  . Cancer Paternal Aunt        unk. type; deceased 6  . Colon cancer Neg Hx   . Esophageal cancer Neg Hx   . Rectal cancer Neg Hx   . Stomach cancer Neg Hx   The patient's parents are in their mid 15s as of November 2018.  The patient had 1 brother, 1 sister.  The sister died from a rare liver cancer at age 68.  A maternal aunt and a paternal grandmother were diagnosed with breast cancer in their 39s.  GYNECOLOGIC HISTORY:  No LMP recorded. Patient has had an ablation. Menarche age 61.  She underwent endometrial ablation 2011.  She is no longer having  periods.  She never used hormone replacement.  First live birth was age 69.  She is GX P2.   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  Her husband [name] works as an Firefighter for [employer]  Daughter Marlenne Ridge attends [employer].  Son Baya Lentz is doing on apprenticeship in the [employer] program.    ADVANCED DIRECTIVES: Not in place   HEALTH MAINTENANCE: Social History   Tobacco Use  . Smoking status: Former Research scientist (life sciences)  . Smokeless tobacco: Never Used  Substance Use Topics  . Alcohol use: Yes    Alcohol/week: 0.0 standard drinks    Comment: sometimes  . Drug use: Yes    Types: Marijuana    Comment: last smoked 11-10-17 ("for anxiety"), uses daily     Colonoscopy: 2016/[facility]  PAP: October 2018  Bone density: Never   Allergies  Allergen Reactions  . Other Other (See Comments)    Red wine & white cheese: migraine Raw egg whites- hives    Current Outpatient Medications  Medication Sig Dispense Refill  . amitriptyline (ELAVIL) 50 MG tablet Take 25 mg by mouth at bedtime as needed for sleep. Patient taking differently; take 50 mg daily.    Marland Kitchen azelastine (OPTIVAR) 0.05 % ophthalmic solution INSTILL 1 DROP INTO EACH EYE AS NEEDED  1  . Calcium Glycerophosphate (PRELIEF PO) Take 2 tablets by mouth as needed (dysuria).     . CALCIUM PO Take 1 tablet by mouth daily.    . Cetirizine HCl (ZYRTEC ALLERGY PO) Take 10 mg by mouth daily.     . Cholecalciferol (VITAMIN D PO) Take by mouth. 600 units daily    . dexamethasone (DECADRON) 2 MG tablet Take 1 tablet (2 mg total) by mouth 2 (two) times daily. 60 tablet 0  . dimenhyDRINATE (DRAMAMINE) 50 MG tablet Take 50 mg by mouth every 8 (eight) hours as needed for nausea.     . fluticasone (FLONASE) 50 MCG/ACT nasal spray Place 2 sprays into both nostrils daily.    Marland Kitchen LORazepam (ATIVAN) 0.5 MG tablet Take 1 tablet (0.5 mg total) by mouth at bedtime. 30 tablet 0  . Multiple Vitamin  (MULTIVITAMIN) tablet Take 1 tablet by mouth daily.    . Naproxen Sod-diphenhydrAMINE (ALEVE PM PO) Take by mouth at bedtime.    . ondansetron (ZOFRAN) 8 MG tablet Take 1 tablet (8 mg total) by mouth every 8 (eight) hours as needed for nausea or vomiting. 30 tablet 1  . OVER THE COUNTER MEDICATION Take 2 tablets by mouth as needed (excedrine - tension headache; for headache).     . OVER THE COUNTER MEDICATION Take 2 tablets by mouth as needed (Excedrine - pain reliever; for joint pain associated with  fibromyalgia (acetaminophen 230m+Asprin 2554mcaffeine 652m.     . oMarland KitchenyCODONE-acetaminophen (PERCOCET/ROXICET) 5-325 MG tablet Take 1 tablet by mouth every 4 (four) hours as needed. May take 2 tablets if needed every 4-6 hours. 60 tablet 0  . Pseudoephedrine HCl (SUDAFED PO) Take 1 tablet by mouth as needed (psuedoephedrine HCl 79m77m    . Pseudoephedrine-guaiFENesin (MUCINEX D PO) Take 1 tablet by mouth as needed (guaifenesin 1200mg14mudoephedrine HCl 120mg)65m   No current facility-administered medications for this visit.     OBJECTIVE: Middle-aged white woman evaluated in the radiation treatment room  There were no vitals filed for this visit.   There is no height or weight on file to calculate BMI.   Wt Readings from Last 3 Encounters:  04/10/18 158 lb 3.2 oz (71.8 kg)  03/26/18 163 lb 11.2 oz (74.3 kg)  03/21/18 165 lb (74.8 kg)  ECOG FS:1 - Symptomatic but completely ambulatory    LAB RESULTS:  CMP     Component Value Date/Time   NA 138 03/21/2018 0847   NA 138 07/10/2017 1458   K 3.6 03/21/2018 0847   K 3.7 07/10/2017 1458   CL 99 03/21/2018 0847   CO2 27 03/21/2018 0847   CO2 25 07/10/2017 1458   GLUCOSE 126 (H) 03/21/2018 0847   GLUCOSE 95 07/10/2017 1458   BUN 9 03/21/2018 0847   BUN 7.4 07/10/2017 1458   CREATININE 0.84 03/21/2018 0847   CREATININE 0.7 07/10/2017 1458   CALCIUM 9.8 03/21/2018 0847   CALCIUM 8.9 07/10/2017 1458   PROT 7.9 03/21/2018 0847   PROT  7.1 07/10/2017 1458   ALBUMIN 3.6 03/21/2018 0847   ALBUMIN 3.8 07/10/2017 1458   AST 21 03/21/2018 0847   AST 10 07/10/2017 1458   ALT 9 03/21/2018 0847   ALT 11 07/10/2017 1458   ALKPHOS 111 03/21/2018 0847   ALKPHOS 92 07/10/2017 1458   BILITOT 0.4 03/21/2018 0847   BILITOT <0.22 07/10/2017 1458   GFRNONAA >60 03/21/2018 0847   GFRAA >60 03/21/2018 0847    No results found for: TOTALPRonnald Ramp, A2GS, BETS, BETA2SER, GAMS, MSPIKE, SPEI  No results found for: KPAFRENils PyleAMAurora Advanced Healthcare [facility] Surgical CenterResults  Component Value Date   WBC 4.9 03/25/2018   NEUTROABS 3.6 03/21/2018   HGB 13.0 03/25/2018   HCT 37.9 03/25/2018   MCV 92.4 03/25/2018   PLT 291 03/25/2018      Chemistry      Component Value Date/Time   NA 138 03/21/2018 0847   NA 138 07/10/2017 1458   K 3.6 03/21/2018 0847   K 3.7 07/10/2017 1458   CL 99 03/21/2018 0847   CO2 27 03/21/2018 0847   CO2 25 07/10/2017 1458   BUN 9 03/21/2018 0847   BUN 7.4 07/10/2017 1458   CREATININE 0.84 03/21/2018 0847   CREATININE 0.7 07/10/2017 1458      Component Value Date/Time   CALCIUM 9.8 03/21/2018 0847   CALCIUM 8.9 07/10/2017 1458   ALKPHOS 111 03/21/2018 0847   ALKPHOS 92 07/10/2017 1458   AST 21 03/21/2018 0847   AST 10 07/10/2017 1458   ALT 9 03/21/2018 0847   ALT 11 07/10/2017 1458   BILITOT 0.4 03/21/2018 0847   BILITOT <0.22 07/10/2017 1458       No results found for: LABCA2  No components found for: [id number] for input(s): INR in the last 168 hours.  No results found for: LABCA2  No results found for: [id number]  No results found for: SAY301  No results found for: SWF093  No results found for: CA2729  No components found for: HGQUANT  No results found for: CEA1 / No results found for: CEA1   No results found for: AFPTUMOR  No results found for: CHROMOGRNA  No results found for: PSA1  No visits with results within 3 Day(s) from this visit.  Latest  known visit with results is:  Hospital Outpatient Visit on 03/25/2018  Component Date Value Ref Range Status  . aPTT 03/25/2018 31  24 - 36 seconds Final   Performed at [facility], [place] [address] [place], [place] [address]  . WBC 03/25/2018 4.9  4.0 - 10.5 K/uL Final  . RBC 03/25/2018 4.10  3.87 - 5.11 MIL/uL Final  . Hemoglobin 03/25/2018 13.0  12.0 - 15.0 g/dL Final  . HCT 03/25/2018 37.9  36.0 - 46.0 % Final  . MCV 03/25/2018 92.4  78.0 - 100.0 fL Final  . MCH 03/25/2018 31.7  26.0 - 34.0 pg Final  . MCHC 03/25/2018 34.3  30.0 - 36.0 g/dL Final  . RDW 03/25/2018 12.9  11.5 - 15.5 % Final  . Platelets 03/25/2018 291  150 - 400 K/uL Final   Performed at [facility], [place] [address], [place], [place] [address]  . Prothrombin Time 03/25/2018 12.9  11.4 - 15.2 seconds Final  . INR 03/25/2018 0.98   Final   Performed at [facility], [address] [place], [place] [address]    (this displays the last labs from the last 3 days)  No results found for: TOTALPROTELP, ALBUMINELP, A1GS, A2GS, BETS, BETA2SER, GAMS, MSPIKE, SPEI (this displays SPEP labs)  No results found for: KPAFRELGTCHN, LAMBDASER, KAPLAMBRATIO (kappa/lambda light chains)  No results found for: HGBA, HGBA2QUANT, HGBFQUANT, HGBSQUAN (Hemoglobinopathy evaluation)   No results found for: LDH  No results found for: IRON, TIBC, IRONPCTSAT (Iron and TIBC)  No results found for: FERRITIN  Urinalysis    Component Value Date/Time   COLORURINE YELLOW 07/07/2017 1400   APPEARANCEUR CLEAR 07/07/2017 1400   LABSPEC 1.003 (L) 07/07/2017 1400   LABSPEC 1.010 05/30/2017 1315   PHURINE 7.0 07/07/2017 1400   GLUCOSEU NEGATIVE 07/07/2017 1400   GLUCOSEU Negative 05/30/2017 1315   HGBUR NEGATIVE 07/07/2017 1400   BILIRUBINUR NEGATIVE 07/07/2017 1400   BILIRUBINUR Color Interference 05/30/2017 1315   KETONESUR NEGATIVE 07/07/2017 1400   PROTEINUR NEGATIVE 07/07/2017  1400   UROBILINOGEN Color Interference 05/30/2017 1315   NITRITE NEGATIVE 07/07/2017 1400   LEUKOCYTESUR NEGATIVE 07/07/2017 1400   LEUKOCYTESUR Color Interference 05/30/2017 1315     STUDIES: Ct Head W Wo Contrast  Result Date: 03/28/2018 CLINICAL DATA:  Breast cancer staging EXAM: CT HEAD WITHOUT AND WITH CONTRAST TECHNIQUE: Contiguous axial images were obtained from the base of the skull through the vertex without and with intravenous contrast CONTRAST:  135m OMNIPAQUE IOHEXOL 300 MG/ML  SOLN COMPARISON:  None available FINDINGS: Brain: 12 enhancing brain metastases distributed in the bilateral cerebrum and left cerebellum, marked on series 7. The largest measures 17 mm in the anterior left frontal lobe. There is mild to no vasogenic edema about the metastases. No midline shift. No leptomeningeal pattern enhancement. Negative for infarct, hydrocephalus, or collection. Vascular: No hyperdense vessel or unexpected calcification. Visible vessels are patent. Skull: No evident bony metastasis Sinuses/Orbits: Negative These results will be called to the ordering clinician or representative by the Radiologist Assistant, and communication documented in the PACS or zVision  Dashboard. IMPRESSION: 12 brain metastases measuring up to 17 mm in the left frontal lobe, marked on series 7. Mild to no vasogenic edema. No shift or herniation. Electronically Signed   By: Monte Fantasia M.D.   On: 03/28/2018 15:53   Ct Chest W Contrast  Result Date: 03/28/2018 CLINICAL DATA:  Left breast cancer. EXAM: CT CHEST, ABDOMEN, AND PELVIS WITH CONTRAST TECHNIQUE: Multidetector CT imaging of the chest, abdomen and pelvis was performed following the standard protocol during bolus administration of intravenous contrast. CONTRAST:  160m OMNIPAQUE IOHEXOL 300 MG/ML  SOLN COMPARISON:  Pelvic MRI 03/20/2018 . FINDINGS: CT CHEST FINDINGS Cardiovascular: The heart size is normal. No substantial pericardial effusion. No thoracic  aortic aneurysm. Right Port-A-Cath tip is positioned in the distal SVC. Mediastinum/Nodes: Mediastinal and hilar lymphadenopathy evident. Index subcarinal necrotic lymph node measures 2.5 cm short axis. Anterior right hilar lymph node measures 2.3 cm short axis. 11 mm short axis necrotic lymph node is seen in the left hilum. The esophagus has normal imaging features. 10 mm short axis node identified right axilla. Small lymph nodes are seen in the left axilla. Lungs/Pleura: The central tracheobronchial airways are patent. Numerous bilateral pulmonary nodules are identified. 2.0 cm right upper lobe nodule measured as an index lesion (49/5). Posterior right upper lobe mass measures 3.8 x 5.2 cm. 2.2 cm nodule identified right middle lobe. 6.2 x 4.4 cm mass identified in the medial right lower lobe. A nodule in the posterior left lower lobe (135/5) measures 2.2 cm no pleural effusion. Musculoskeletal: No worrisome lytic or sclerotic osseous abnormality. Postsurgical changes are noted in the left breast. 1.7 x 2.8 cm heterogeneously enhancing lesion is identified in the right breast. CT ABDOMEN PELVIS FINDINGS Hepatobiliary: 3.6 cm lesion posterior right liver consistent with metastatic involvement. 11 mm lesion in the lateral segment left liver is too small to characterize. There is no evidence for gallstones, gallbladder wall thickening, or pericholecystic fluid. No intrahepatic or extrahepatic biliary dilation. Pancreas: No focal mass lesion. No dilatation of the main duct. No intraparenchymal cyst. No peripancreatic edema. Spleen: No splenomegaly. No focal mass lesion. Adrenals/Urinary Tract: No adrenal nodule or mass. Kidneys unremarkable. No evidence for hydroureter. The urinary bladder appears normal for the degree of distention. Stomach/Bowel: Stomach is nondistended. No gastric wall thickening. No evidence of outlet obstruction. Duodenum is normally positioned as is the ligament of Treitz. No small bowel wall  thickening. No small bowel dilatation. The terminal ileum is normal. Diverticuli are seen scattered along the entire length of the colon without CT findings of diverticulitis. Vascular/Lymphatic: There is abdominal aortic atherosclerosis without aneurysm. There is no gastrohepatic or hepatoduodenal ligament lymphadenopathy. No intraperitoneal or retroperitoneal lymphadenopathy. Reproductive: Uterus unremarkable.  There is no adnexal mass. Other: 6 mm retroperitoneal nodule identified in the right abdomen (86/3). Musculoskeletal: 1.8 x 3.1 cm lesion in the subcutaneous fat of the right gluteal region is compatible with metastatic involvement. 2.1 x 1.8 cm lesion in the medial right gluteal region also consistent with metastatic disease. Probable 13 mm metastatic nodule in the fat of the left paramidline gluteal region (125/3). 13 mm sclerotic lesion identified left iliac bone (106/3). Previous pelvic MRI documented multiple foci of abnormal signal consistent with bony metastatic involvement including posterior right acetabulum, but no CT correlate lesions are evident. IMPRESSION: 1. Metastatic lymphadenopathy in the mediastinum and both hila associated with numerous bilateral pulmonary metastatic nodules and masses. 2. Heterogeneously enhancing right breast mass with mild right axillary lymphadenopathy. 3. 3.6 cm lesion posterior  right liver consistent with metastatic involvement. 4. Subcutaneous lesions in the gluteal regions bilaterally consistent with metastases. 5. 6 mm retroperitoneal nodule in the right abdomen suspicious for metastatic disease. 6. Previous pelvis MRI documented multiple bony metastases, but these are not evident by CT. 7.  Aortic Atherosclerois (ICD10-170.0) Electronically Signed   By: Misty Stanley M.D.   On: 03/28/2018 16:21   Mr Jeri Cos VW Contrast  Result Date: 04/09/2018 CLINICAL DATA:  Metastatic breast cancer.  Staging. EXAM: MRI HEAD WITHOUT AND WITH CONTRAST TECHNIQUE:  Multiplanar, multiecho pulse sequences of the brain and surrounding structures were obtained without and with intravenous contrast. CONTRAST:  7.5 mL Gadavist COMPARISON:  Head CT 03/28/2018 FINDINGS: Brain: There is no evidence of acute infarct, midline shift, or extra-axial fluid collection. A remote hemorrhagic infarct is noted posteriorly in the right lentiform nucleus. The ventricles are normal in size. There are at least 59 enhancing brain metastases (annotated on series 11) with a questionable additional punctate lesion in the right superior frontal gyrus (series 11, image 47). The largest lesion measures 2 cm in the left frontal lobe with moderate surrounding edema which has mildly increased from the prior CT. Up to mild edema is associated with a few additional lesions in both cerebral hemispheres. The largest cerebellar lesion measures 1 cm on the left with mild surrounding edema. There is no significant mass effect. A few lesions contain some old blood products. There is a 7 mm lesion in the pineal region. Vascular: Major intracranial vascular flow voids are preserved. Skull and upper cervical spine: Unremarkable bone marrow signal. Sinuses/Orbits: Unremarkable orbits. Small right maxillary sinus mucous retention cyst. Clear mastoid air cells. Other: None. IMPRESSION: 1. Approximately 40 brain metastases. Increased, moderate edema in the left frontal lobe without mass effect. 2. Remote right basal ganglia hemorrhage. Electronically Signed   By: Logan Bores M.D.   On: 04/09/2018 11:45   Mr Pelvis W Wo Contrast  Result Date: 03/21/2018 CLINICAL DATA:  Palpable mass in the right buttock for approximately 2 weeks in a patient with a history of breast cancer. EXAM: MRI PELVIS WITHOUT AND WITH CONTRAST TECHNIQUE: Multiplanar, multisequence MR imaging of the pelvis was performed following the administration of intravenous contrast. CONTRAST:  16 mL MULTIHANCE GADOBENATE DIMEGLUMINE 529 MG/ML IV SOLN  COMPARISON:  None. FINDINGS: Bones/Joint/Cartilage Multiple foci of abnormal marrow signal with postcontrast enhancement are identified. The largest single lesion is seen in the proximal diaphysis of the right femur. The lesion fills the medullary space and measures 4.2 cm craniocaudal on image 26 of series 10. A second lesion is in the posterior right acetabulum measuring 1.6 x 1.6 cm in the axial plane on image 30 of series 4. A third index lesion in the anterior most aspect of the left iliac wing measures 1.1 cm AP by 1 cm transverse on image 14 of series 4. No fracture is identified. Ligaments Negative. Muscles and Tendons Intact. Soft tissues A marker is placed in the region of concern over the right buttock. In the deep subcutaneous fatty tissues, there is an enhancing mass which measures 2.9 cm transverse by 2.0 cm in the axial plane on image 17 of series 11. The lesion is contiguous with the gluteus maximus. A second enhancing mass lesion in the subcutaneous fatty tissues of the medial right thigh measures 2.3 by 2.5 cm on image 48 of series 11. IMPRESSION: Multiple foci of abnormal marrow signal and enhancement consistent with metastatic disease. Enhancing subcutaneous lesions in the region  of concern in the right buttock and medial aspect of the right thigh are also most consistent with metastatic disease. Electronically Signed   By: Inge Rise M.D.   On: 03/21/2018 08:57   Nm Bone Scan Whole Body  Result Date: 04/02/2018 CLINICAL DATA:  History metastatic breast cancer. Generalized body pain. EXAM: NUCLEAR MEDICINE WHOLE BODY BONE SCAN TECHNIQUE: Whole body anterior and posterior images were obtained approximately 3 hours after intravenous injection of radiopharmaceutical. RADIOPHARMACEUTICALS:  21.3  mCi Technetium-37mMDP IV COMPARISON:  CT scan dated 03/28/2018 FINDINGS: There is focal increased activity of the lateral aspect of the proximal right femur several cm below the lesser trochanter.  There is also focal slight increased activity of the left iliac bone just below the anterior aspect of the superior iliac crest. These areas are worrisome for metastatic disease. There is slight increased activity at the right sternoclavicular joint which correlates with slight arthritic changes present on the prior CT scan. IMPRESSION: Abnormal activity in the left iliac bone and right proximal femur consistent with metastatic disease. Review of the prior CT scan does demonstrate a subtle 8 mm lytic lesion in the left iliac bone in the area of abnormal activity on bone scan. The area of abnormal activity in the proximal right femur is not completely included on the prior CT scan but the visualized portion appears normal. Electronically Signed   By: JLorriane ShireM.D.   On: 04/02/2018 08:39   Ct Abdomen Pelvis W Contrast  Result Date: 03/28/2018 CLINICAL DATA:  Left breast cancer. EXAM: CT CHEST, ABDOMEN, AND PELVIS WITH CONTRAST TECHNIQUE: Multidetector CT imaging of the chest, abdomen and pelvis was performed following the standard protocol during bolus administration of intravenous contrast. CONTRAST:  1023mOMNIPAQUE IOHEXOL 300 MG/ML  SOLN COMPARISON:  Pelvic MRI 03/20/2018 . FINDINGS: CT CHEST FINDINGS Cardiovascular: The heart size is normal. No substantial pericardial effusion. No thoracic aortic aneurysm. Right Port-A-Cath tip is positioned in the distal SVC. Mediastinum/Nodes: Mediastinal and hilar lymphadenopathy evident. Index subcarinal necrotic lymph node measures 2.5 cm short axis. Anterior right hilar lymph node measures 2.3 cm short axis. 11 mm short axis necrotic lymph node is seen in the left hilum. The esophagus has normal imaging features. 10 mm short axis node identified right axilla. Small lymph nodes are seen in the left axilla. Lungs/Pleura: The central tracheobronchial airways are patent. Numerous bilateral pulmonary nodules are identified. 2.0 cm right upper lobe nodule measured as an  index lesion (49/5). Posterior right upper lobe mass measures 3.8 x 5.2 cm. 2.2 cm nodule identified right middle lobe. 6.2 x 4.4 cm mass identified in the medial right lower lobe. A nodule in the posterior left lower lobe (135/5) measures 2.2 cm no pleural effusion. Musculoskeletal: No worrisome lytic or sclerotic osseous abnormality. Postsurgical changes are noted in the left breast. 1.7 x 2.8 cm heterogeneously enhancing lesion is identified in the right breast. CT ABDOMEN PELVIS FINDINGS Hepatobiliary: 3.6 cm lesion posterior right liver consistent with metastatic involvement. 11 mm lesion in the lateral segment left liver is too small to characterize. There is no evidence for gallstones, gallbladder wall thickening, or pericholecystic fluid. No intrahepatic or extrahepatic biliary dilation. Pancreas: No focal mass lesion. No dilatation of the main duct. No intraparenchymal cyst. No peripancreatic edema. Spleen: No splenomegaly. No focal mass lesion. Adrenals/Urinary Tract: No adrenal nodule or mass. Kidneys unremarkable. No evidence for hydroureter. The urinary bladder appears normal for the degree of distention. Stomach/Bowel: Stomach is nondistended. No gastric wall  thickening. No evidence of outlet obstruction. Duodenum is normally positioned as is the ligament of Treitz. No small bowel wall thickening. No small bowel dilatation. The terminal ileum is normal. Diverticuli are seen scattered along the entire length of the colon without CT findings of diverticulitis. Vascular/Lymphatic: There is abdominal aortic atherosclerosis without aneurysm. There is no gastrohepatic or hepatoduodenal ligament lymphadenopathy. No intraperitoneal or retroperitoneal lymphadenopathy. Reproductive: Uterus unremarkable.  There is no adnexal mass. Other: 6 mm retroperitoneal nodule identified in the right abdomen (86/3). Musculoskeletal: 1.8 x 3.1 cm lesion in the subcutaneous fat of the right gluteal region is compatible with  metastatic involvement. 2.1 x 1.8 cm lesion in the medial right gluteal region also consistent with metastatic disease. Probable 13 mm metastatic nodule in the fat of the left paramidline gluteal region (125/3). 13 mm sclerotic lesion identified left iliac bone (106/3). Previous pelvic MRI documented multiple foci of abnormal signal consistent with bony metastatic involvement including posterior right acetabulum, but no CT correlate lesions are evident. IMPRESSION: 1. Metastatic lymphadenopathy in the mediastinum and both hila associated with numerous bilateral pulmonary metastatic nodules and masses. 2. Heterogeneously enhancing right breast mass with mild right axillary lymphadenopathy. 3. 3.6 cm lesion posterior right liver consistent with metastatic involvement. 4. Subcutaneous lesions in the gluteal regions bilaterally consistent with metastases. 5. 6 mm retroperitoneal nodule in the right abdomen suspicious for metastatic disease. 6. Previous pelvis MRI documented multiple bony metastases, but these are not evident by CT. 7.  Aortic Atherosclerois (ICD10-170.0) Electronically Signed   By: Misty Stanley M.D.   On: 03/28/2018 16:21   Ct Biopsy  Result Date: 03/25/2018 CLINICAL DATA:  History of left breast carcinoma with recent palpable right gluteal mass and imaging evidence of right gluteal soft tissue, right medial thigh and bone marrow metastases by MRI. Request has been made to sample the right gluteal subcutaneous soft tissue mass. EXAM: CT GUIDED CORE BIOPSY OF BODY PART ANESTHESIA/SEDATION: 2.0 mg IV Versed; 100 mcg IV Fentanyl Total Moderate Sedation Time:  12 minutes. The patient's level of consciousness and physiologic status were continuously monitored during the procedure by Radiology nursing. PROCEDURE: The procedure risks, benefits, and alternatives were explained to the patient. Questions regarding the procedure were encouraged and answered. The patient understands and consents to the  procedure. A time-out was performed prior to initiating the procedure. The right gluteal region was prepped with chlorhexidine in a sterile fashion, and a sterile drape was applied covering the operative field. A sterile gown and sterile gloves were used for the procedure. Local anesthesia was provided with 1% Lidocaine. CT was performed in a prone position. Under CT guidance, a 17 gauge trocar needle was advanced to the level of a right gluteal subcutaneous soft tissue mass. Coaxial 18 gauge core biopsy samples were obtained. A total of 4 samples were obtained and submitted in formalin. COMPLICATIONS: None FINDINGS: Oval-shaped soft tissue mass in the right gluteal subcutaneous fat just superficial to the gluteal musculature measures approximately 2.2 x 3.5 cm in greatest transverse dimensions. Solid tissue was obtained with core biopsy. IMPRESSION: CT-guided core biopsy performed of a right gluteal subcutaneous soft tissue mass measuring approximately 3.5 cm in greatest diameter by CT today. Electronically Signed   By: Aletta Edouard M.D.   On: 03/25/2018 12:31   US Breast Ltd Uni Right Inc Axilla  Result Date: 03/27/2018 CLINICAL DATA:  Patient presents for diagnostic right breast examination due to a palpable abnormality over the past 2 weeks in the slightly inner  upper breast. History of triple negative left breast cancer diagnosed November 2018 post neoadjuvant chemotherapy and subsequent lumpectomy May 2019. Known biopsy-proven metastatic disease to bone and subcutaneous fat as well as known pulmonary metastases. EXAM: DIGITAL DIAGNOSTIC right MAMMOGRAM WITH CAD AND TOMO ULTRASOUND right BREAST COMPARISON:  Previous exam(s). ACR Breast Density Category c: The breast tissue is heterogeneously dense, which may obscure small masses. FINDINGS: Examination demonstrates a lobulated mass with partially circumscribed and partially obscured borders over the inner upper right breast corresponding to patient's  palpable abnormality. Remainder the exam is unchanged. Mammographic images were processed with CAD. On physical exam, I palpate a firm fixed 4 x 4 cm mass at the 12:30-1 o'clock position of the right breast approximately 4-5 cm from the nipple. Targeted ultrasound is performed, showing a large irregular shaped heterogeneous mass with mixed circumscribed, irregular and indistinct borders at the 12:30-1 o'clock position of the right breast 5 cm from the nipple measuring approximately 1.6 x 3.2 x 4.1 cm. Ultrasound the right axilla demonstrates several normal lymph nodes as well as a heterogeneous mass within the subcutaneous fat over the high right axilla towards the undersurface of the arm measuring 0.9 x 1.5 x 1.6 cm likely a metastatic focus. IMPRESSION: Suspicious mass over the 12:30-1 o'clock position of the right breast 5 cm from the nipple measuring 1.6 x 3.2 x 4.1 cm likely malignant. Suspicious mass over the high right axilla measuring 1.6 cm in greatest diameter likely metastatic disease. RECOMMENDATION: Recommend ultrasound-guided core needle biopsy of the right breast mass and high right axillary mass as described. I have discussed the findings and recommendations with the patient. Results were also provided in writing at the conclusion of the visit. If applicable, a reminder letter will be sent to the patient regarding the next appointment. BI-RADS CATEGORY  5: Highly suggestive of malignancy. Biopsy scheduled for tomorrow at 10:30 a.m. here at the breast Center. Electronically Signed   By: Marin Olp M.D.   On: 03/27/2018 09:49   Mm Diag Breast Tomo Uni Right  Result Date: 03/27/2018 CLINICAL DATA:  Patient presents for diagnostic right breast examination due to a palpable abnormality over the past 2 weeks in the slightly inner upper breast. History of triple negative left breast cancer diagnosed November 2018 post neoadjuvant chemotherapy and subsequent lumpectomy May 2019. Known biopsy-proven  metastatic disease to bone and subcutaneous fat as well as known pulmonary metastases. EXAM: DIGITAL DIAGNOSTIC right MAMMOGRAM WITH CAD AND TOMO ULTRASOUND right BREAST COMPARISON:  Previous exam(s). ACR Breast Density Category c: The breast tissue is heterogeneously dense, which may obscure small masses. FINDINGS: Examination demonstrates a lobulated mass with partially circumscribed and partially obscured borders over the inner upper right breast corresponding to patient's palpable abnormality. Remainder the exam is unchanged. Mammographic images were processed with CAD. On physical exam, I palpate a firm fixed 4 x 4 cm mass at the 12:30-1 o'clock position of the right breast approximately 4-5 cm from the nipple. Targeted ultrasound is performed, showing a large irregular shaped heterogeneous mass with mixed circumscribed, irregular and indistinct borders at the 12:30-1 o'clock position of the right breast 5 cm from the nipple measuring approximately 1.6 x 3.2 x 4.1 cm. Ultrasound the right axilla demonstrates several normal lymph nodes as well as a heterogeneous mass within the subcutaneous fat over the high right axilla towards the undersurface of the arm measuring 0.9 x 1.5 x 1.6 cm likely a metastatic focus. IMPRESSION: Suspicious mass over the 12:30-1 o'clock position of  the right breast 5 cm from the nipple measuring 1.6 x 3.2 x 4.1 cm likely malignant. Suspicious mass over the high right axilla measuring 1.6 cm in greatest diameter likely metastatic disease. RECOMMENDATION: Recommend ultrasound-guided core needle biopsy of the right breast mass and high right axillary mass as described. I have discussed the findings and recommendations with the patient. Results were also provided in writing at the conclusion of the visit. If applicable, a reminder letter will be sent to the patient regarding the next appointment. BI-RADS CATEGORY  5: Highly suggestive of malignancy. Biopsy scheduled for tomorrow at 10:30  a.m. here at the breast Center. Electronically Signed   By: Marin Olp M.D.   On: 03/27/2018 09:49   [place] Axillary Node Core Biopsy Right  Addendum Date: 04/07/2018   ADDENDUM REPORT: 04/01/2018 14:15 ADDENDUM: Pathology revealed POORLY DIFFERENTIATED CARCINOMA of the Right breast, upper inner quadrant, 12:30 o'clock. POORLY DIFFERENTIATED CARCINOMA of the upper Right axilla, subcutaneous tissues. This was found to be concordant by Dr. Peggye Fothergill. Pathology results were discussed with the patient by telephone. The patient reported doing well after the biopsies with tenderness at the sites. Post biopsy instructions and care were reviewed and questions were answered. The patient was encouraged to call [facility] for any additional concerns. The patient has a recent diagnosis of bilateral breast cancer and should follow her outlined treatment plan. Pathology results reported by Terie Purser, RN on 04/01/2018. Electronically Signed   By: Evangeline Dakin M.D.   On: 04/01/2018 14:15   Result Date: 04/07/2018 CLINICAL DATA:  52 year old with history of triple negative LEFT breast cancer diagnosed in October, 2018 with neoadjuvant chemotherapy and lumpectomy in May, 2019. She presented with a palpable lump in the UPPER INNER RIGHT breast and was shown on diagnostic workup to have an approximate 4.5 cm mass at the 12:30-1 o'clock location and a solitary pathologic RIGHT axillary lymph node. Biopsy of the RIGHT breast mass and the RIGHT axillary lymph node is performed. Patient had a CT-guided biopsy of a subcutaneous mass in the RIGHT buttock on 03/25/2018. EXAM: ULTRASOUND-GUIDED CORE NEEDLE BIOPSY OF THE RIGHT BREAST ULTRASOUND GUIDED CORE NEEDLE BIOPSY OF A RIGHT AXILLARY NODE COMPARISON:  Previous exam(s). FINDINGS: I met with the patient and we discussed the procedure of ultrasound-guided biopsy, including benefits and alternatives. We discussed the high likelihood of a successful  procedure. We discussed the risks of the procedure, including infection, bleeding, tissue injury, clip migration, and inadequate sampling. Informed written consent was given. The usual time-out protocol was performed immediately prior to the procedure. Initially, using sterile technique with chlorhexidine as skin antisepsis, 1% lidocaine and 1% lidocaine with epinephrine as local anesthetic, under direct ultrasound visualization, a 12 gauge Bard Marquee core needle device placed through an 11 gauge introducer needle was used to perform biopsy of the mass in the UPPER INNER RIGHT breast at the 12:30 o'clock location using a LATERAL approach. At the conclusion of the procedure a ribbon shaped tissue marker clip was deployed into the biopsy cavity. Lesion quadrant: UPPER INNER QUADRANT. Subsequently, using sterile technique with chlorhexidine as skin antisepsis, 1% lidocaine and 1% lidocaine with epinephrine as local anesthetic, under direct ultrasound visualization, a 14 gauge Bard Marquee core needle device placed through a 13 gauge introducer needle was used to perform biopsy of the mass in the UPPER RIGHT axilla using an inferolateral approach. At the conclusion of the procedure a spiral shaped HydroMark tissue marker clip was deployed into  the biopsy cavity. Follow up 2 view mammogram was performed and dictated separately. IMPRESSION: Ultrasound guided biopsy of a mass involving the UPPER INNER QUADRANT of the RIGHT breast and a mass involving the UPPER RIGHT axilla. No apparent complications. Electronically Signed: By: Evangeline Dakin M.D. On: 03/28/2018 11:54   Mm Clip Placement Right  Result Date: 03/28/2018 CLINICAL DATA:  Confirmation of clip placement after ultrasound-guided core needle biopsy of a mass involving the UPPER INNER RIGHT breast and a mass in the UPPER RIGHT axilla. EXAM: DIAGNOSTIC RIGHT MAMMOGRAM POST ULTRASOUND BIOPSY COMPARISON:  Previous exam(s). FINDINGS: Mammographic images were  obtained following ultrasound guided biopsy of a mass involving the UPPER INNER QUADRANT of the RIGHT breast and a mass involving the UPPER RIGHT axilla. The ribbon shaped tissue marker clip is appropriately positioned within the biopsied mass in the [place] at MIDDLE depth. The spiral shaped tissue marker clip is appropriately positioned within the biopsied mass in the UPPER RIGHT axilla. Expected post biopsy changes are present in both locations without evidence of hematoma. IMPRESSION: 1. Appropriate positioning of the ribbon shaped tissue marker clip within the biopsied mass in the [place] of the RIGHT breast. 2. Appropriate positioning of the spiral shaped HydroMark tissue marker clip within the biopsied mass in the UPPER RIGHT axilla. Final Assessment: Post Procedure Mammograms for Marker Placement Electronically Signed   By: Evangeline Dakin M.D.   On: 03/28/2018 11:53   [place] Rt Breast Bx W Loc Dev 1st Lesion Img Bx Spec US Guide  Addendum Date: 04/07/2018   ADDENDUM REPORT: 04/01/2018 14:15 ADDENDUM: Pathology revealed POORLY DIFFERENTIATED CARCINOMA of the Right breast, upper inner quadrant, 12:30 o'clock. POORLY DIFFERENTIATED CARCINOMA of the upper Right axilla, subcutaneous tissues. This was found to be concordant by Dr. Peggye Fothergill. Pathology results were discussed with the patient by telephone. The patient reported doing well after the biopsies with tenderness at the sites. Post biopsy instructions and care were reviewed and questions were answered. The patient was encouraged to call [facility] for any additional concerns. The patient has a recent diagnosis of bilateral breast cancer and should follow her outlined treatment plan. Pathology results reported by Terie Purser, RN on 04/01/2018. Electronically Signed   By: Evangeline Dakin M.D.   On: 04/01/2018 14:15   Result Date: 04/07/2018 CLINICAL DATA:  52 year old with history of triple  negative LEFT breast cancer diagnosed in October, 2018 with neoadjuvant chemotherapy and lumpectomy in May, 2019. She presented with a palpable lump in the UPPER INNER RIGHT breast and was shown on diagnostic workup to have an approximate 4.5 cm mass at the 12:30-1 o'clock location and a solitary pathologic RIGHT axillary lymph node. Biopsy of the RIGHT breast mass and the RIGHT axillary lymph node is performed. Patient had a CT-guided biopsy of a subcutaneous mass in the RIGHT buttock on 03/25/2018. EXAM: ULTRASOUND-GUIDED CORE NEEDLE BIOPSY OF THE RIGHT BREAST ULTRASOUND GUIDED CORE NEEDLE BIOPSY OF A RIGHT AXILLARY NODE COMPARISON:  Previous exam(s). FINDINGS: I met with the patient and we discussed the procedure of ultrasound-guided biopsy, including benefits and alternatives. We discussed the high likelihood of a successful procedure. We discussed the risks of the procedure, including infection, bleeding, tissue injury, clip migration, and inadequate sampling. Informed written consent was given. The usual time-out protocol was performed immediately prior to the procedure. Initially, using sterile technique with chlorhexidine as skin antisepsis, 1% lidocaine and 1% lidocaine with epinephrine as local anesthetic, under direct ultrasound  visualization, a 12 gauge Bard Marquee core needle device placed through an 11 gauge introducer needle was used to perform biopsy of the mass in the UPPER INNER RIGHT breast at the 12:30 o'clock location using a LATERAL approach. At the conclusion of the procedure a ribbon shaped tissue marker clip was deployed into the biopsy cavity. Lesion quadrant: UPPER INNER QUADRANT. Subsequently, using sterile technique with chlorhexidine as skin antisepsis, 1% lidocaine and 1% lidocaine with epinephrine as local anesthetic, under direct ultrasound visualization, a 14 gauge Bard Marquee core needle device placed through a 13 gauge introducer needle was used to perform biopsy of the mass in  the UPPER RIGHT axilla using an inferolateral approach. At the conclusion of the procedure a spiral shaped HydroMark tissue marker clip was deployed into the biopsy cavity. Follow up 2 view mammogram was performed and dictated separately. IMPRESSION: Ultrasound guided biopsy of a mass involving the UPPER INNER QUADRANT of the RIGHT breast and a mass involving the UPPER RIGHT axilla. No apparent complications. Electronically Signed: By: Evangeline Dakin M.D. On: 03/28/2018 11:54     ELIGIBLE FOR AVAILABLE RESEARCH PROTOCOL: no  ASSESSMENT: 51 y.o. [place] status post left breast upper outer quadrant biopsy May 14, 2017 for a clinical T2 N0, stage IIB invasive ductal carcinoma, grade 3, triple negative, with an MIB-1 of 80%  (1) neoadjuvant chemotherapy consisting of doxorubicin and cyclophosphamide in dose dense fashion x4 started 06/04/2017, completed 07/17/2017; followed by weekly carboplatin and paclitaxel x12 started 07/30/2017, completed 10/22/2017  (a) echo on 05/30/2017: LVEF 55-60%  (2) Right lumpectomy and SLNB on 11/13/2017: showed ypT2 ypN0 residual invasive ductal carcinoma, grade 3, repeat prognostic panel again triple negative.  RCB-II  (3) adjuvant radiation with capecitabine sensitizaton  12/16/2017-01/31/2018, she did experience planter erythrodysesthesia with the Capecitabine, so it was not continued for six months after sensitization (total 6 weeks' treatment)   (4) genetics testing offered through [facility] 08/13/2016 showed: No deleterious mutations in ATM, BARD1, BRCA1, BRCA2, BRIP1, CDH1, CHEK2, DICER1, EPCAM*, MLH1, MSH2, MSH6, NBN, NF1, PALB2, PMS2, PTEN, RAD50, RAD51C, RAD51D, SMARCA4, STK11, TP53  (5) participated in [facility], randomized to ARM 2= pembrolizumab Q 21 days, started 02/28/2018, received second dose 03/21/2018, after which she came off study with evidence of metastatic disease  METASTATIC DISEASE Sept 2019 (6) pelvic MRI suggests bone involvement as well  as subcutaneous masses in the right buttock  (a) right breast and right axillary lymph node biopsy 03/28/2018 confirms recurrent triple negative breast cancer  (b) right buttock mass biopsy 03/25/2018,  positive for metastatic breast cancer, triple negative, with an MIB-1 of 80%  (c) CT scans of brain chest abdomen and pelvis, scan and brain MRI show involvement of liver, lungs, bones, and brain  (d) PD-L1 and [facility] One studies on 03/25/2018 obtained: See separately scanned results  (7) whole brain irradiation started on 04/14/2018, to be completed 04/18/2018  (8) systemic chemotherapy and immunotherapy to consist of eribulin days 1 and 8 of each 21-day cycle with pembrolizumab day 1 of each cycle, starting 04/21/2018  PLAN: Ellene's cancer is very aggressive, recurred less than 6 months from completion of optimal chemotherapy, and involves the brain.  She understands that systemic therapy will not affect the brain lesions and that is the reason she is having brain radiation.  She understands also that we cannot give her concurrent chemo immunotherapy while she is receiving the brain treatments.  She very much wants to proceed to further treatment in hopes at least to make  it to Christmas.  We are going to try a regulant on days 1 and 8 of each 21-day cycle and today we discussed some of the possible toxicity side effects and complications of this agent.  She previously had pembrolizumab.  She is now off study and we are trying to obtain that drug through courtesy of the company.  I am hopeful that she will be able to have her first dose on 04/21/2018, since there is some evidence of disease response to this drug.  Amy Dawson understands I cannot give her a time prognosis.  It could be a matter of weeks or he could be a matter of months or it could be longer depending on response.  Certainly she will know in 3 to 4 weeks how things stand.  In the meantime I suggest that she let her children know that  while they do need to continue other studies and their work perhaps they will want to spend some time with her now.  She just "does not want to interrupt her lives".  She is going to consider how to manage this  When she met with my 72 assistant and with Dr. Payton Mccallum last week they emphasized that this is a very serious and dangerous condition and she made it clear that if she cannot make decisions she wants her husband to make decisions for her.  Of course in [place] law he is already automatically her healthcare power of attorney.       Jasmina Gendron, Amy Dad, MD  04/14/18 11:02 AM Medical Oncology and Hematology [facility] [address] [place], [place] [address] Tel. [phone]    Fax. [phone]  Alice Rieger, am acting as scribe for Chauncey Cruel MD.  I, Lurline Del MD, have reviewed the above documentation for accuracy and completeness, and I agree with the above.

## 2018-04-14 NOTE — Telephone Encounter (Signed)
Scheduled appt per 9/29 sch message - left message for pt with appt date and time.

## 2018-04-15 ENCOUNTER — Ambulatory Visit
Admission: RE | Admit: 2018-04-15 | Discharge: 2018-04-15 | Disposition: A | Discharge status: Home / Self Care | Payer: 59 | Source: Ambulatory Visit | Attending: Radiation Oncology | Admitting: Radiation Oncology

## 2018-04-15 DIAGNOSIS — Z171 Estrogen receptor negative status [ER-]: Secondary | ICD-10-CM | POA: Insufficient documentation

## 2018-04-15 DIAGNOSIS — C50412 Malignant neoplasm of upper-outer quadrant of left female breast: Secondary | ICD-10-CM | POA: Insufficient documentation

## 2018-04-15 DIAGNOSIS — Z79899 Other long term (current) drug therapy: Secondary | ICD-10-CM | POA: Insufficient documentation

## 2018-04-15 DIAGNOSIS — M25551 Pain in right hip: Secondary | ICD-10-CM | POA: Diagnosis not present

## 2018-04-15 DIAGNOSIS — Z923 Personal history of irradiation: Secondary | ICD-10-CM | POA: Insufficient documentation

## 2018-04-16 ENCOUNTER — Ambulatory Visit
Admission: RE | Admit: 2018-04-16 | Discharge: 2018-04-16 | Disposition: A | Discharge status: Home / Self Care | Payer: 59 | Source: Ambulatory Visit | Attending: Radiation Oncology | Admitting: Radiation Oncology

## 2018-04-16 DIAGNOSIS — C50412 Malignant neoplasm of upper-outer quadrant of left female breast: Secondary | ICD-10-CM | POA: Diagnosis not present

## 2018-04-17 ENCOUNTER — Ambulatory Visit
Admission: RE | Admit: 2018-04-17 | Discharge: 2018-04-17 | Disposition: A | Discharge status: Home / Self Care | Payer: 59 | Source: Ambulatory Visit | Attending: Radiation Oncology | Admitting: Radiation Oncology

## 2018-04-17 ENCOUNTER — Other Ambulatory Visit: Payer: Self-pay | Admitting: Radiation Oncology

## 2018-04-17 DIAGNOSIS — C50412 Malignant neoplasm of upper-outer quadrant of left female breast: Secondary | ICD-10-CM | POA: Diagnosis not present

## 2018-04-17 MED ORDER — OXYCODONE HCL 5 MG PO TABS: 10.0000 mg | ORAL_TABLET | ORAL | 0 refills | Status: AC | PRN

## 2018-04-18 ENCOUNTER — Ambulatory Visit
Admission: RE | Admit: 2018-04-18 | Discharge: 2018-04-18 | Disposition: A | Discharge status: Home / Self Care | Payer: 59 | Source: Ambulatory Visit | Attending: Radiation Oncology | Admitting: Radiation Oncology

## 2018-04-18 ENCOUNTER — Encounter: Payer: Self-pay | Admitting: Adult Health

## 2018-04-18 ENCOUNTER — Inpatient Hospital Stay: Payer: 59 | Attending: Oncology | Admitting: Adult Health

## 2018-04-18 ENCOUNTER — Ambulatory Visit (HOSPITAL_COMMUNITY)
Admission: RE | Admit: 2018-04-18 | Discharge: 2018-04-18 | Disposition: A | Discharge status: Home / Self Care | Payer: 59 | Source: Ambulatory Visit | Attending: Adult Health | Admitting: Adult Health

## 2018-04-18 VITALS — BP 117/75 | HR 109 | Temp 97.6°F | Resp 17 | Ht 68.75 in | Wt 154.6 lb

## 2018-04-18 DIAGNOSIS — G893 Neoplasm related pain (acute) (chronic): Secondary | ICD-10-CM | POA: Diagnosis not present

## 2018-04-18 DIAGNOSIS — C50412 Malignant neoplasm of upper-outer quadrant of left female breast: Secondary | ICD-10-CM | POA: Diagnosis present

## 2018-04-18 DIAGNOSIS — Z923 Personal history of irradiation: Secondary | ICD-10-CM | POA: Insufficient documentation

## 2018-04-18 DIAGNOSIS — C7989 Secondary malignant neoplasm of other specified sites: Secondary | ICD-10-CM

## 2018-04-18 DIAGNOSIS — K5903 Drug induced constipation: Secondary | ICD-10-CM

## 2018-04-18 DIAGNOSIS — Z803 Family history of malignant neoplasm of breast: Secondary | ICD-10-CM | POA: Diagnosis not present

## 2018-04-18 DIAGNOSIS — Z5112 Encounter for antineoplastic immunotherapy: Secondary | ICD-10-CM | POA: Insufficient documentation

## 2018-04-18 DIAGNOSIS — Z800 Family history of malignant neoplasm of digestive organs: Secondary | ICD-10-CM

## 2018-04-18 DIAGNOSIS — C7800 Secondary malignant neoplasm of unspecified lung: Secondary | ICD-10-CM | POA: Insufficient documentation

## 2018-04-18 DIAGNOSIS — Z171 Estrogen receptor negative status [ER-]: Secondary | ICD-10-CM | POA: Insufficient documentation

## 2018-04-18 DIAGNOSIS — Z87891 Personal history of nicotine dependence: Secondary | ICD-10-CM | POA: Diagnosis not present

## 2018-04-18 DIAGNOSIS — Z9221 Personal history of antineoplastic chemotherapy: Secondary | ICD-10-CM | POA: Diagnosis not present

## 2018-04-18 DIAGNOSIS — R530 Neoplastic (malignant) related fatigue: Secondary | ICD-10-CM | POA: Insufficient documentation

## 2018-04-18 DIAGNOSIS — Z79899 Other long term (current) drug therapy: Secondary | ICD-10-CM

## 2018-04-18 NOTE — Progress Notes (Signed)
[facility]  [phone] [phone]     ID: Amy Dawson DOB: 1965/10/21  MR#: [id number]  MLY#:[id number]  Patient Care Team: [name], MD as PCP - General (Family Medicine) [name], [name], MD as Consulting Physician (Oncology) [name], MD as Consulting Physician (General Surgery) [name], MD as Consulting Physician (Radiation Oncology) [name], NP as Nurse Practitioner (Obstetrics and Gynecology) OTHER MD:  CHIEF COMPLAINT: Triple negative breast cancer  CURRENT TREATMENT: to resume pembrolizumab, and start eribulin  HISTORY OF CURRENT ILLNESS: From the original intake note:  Amy Dawson felt some discomfort in the left breast in June 2018, but did not think much of it.  However in October she palpated a change in the upper outer quadrant of her left breast and she brought this to medical attention.  On May 09, 2017 she had bilateral diagnostic mammography with tomography at the breast center, showing the breast density to be category capital C.  In the left breast that was not obscured mass in the upper outer quadrant, which by palpation was identified at the 1230 o'clock radiant 5 cm from the nipple.  By ultrasound this measured 2.9 cm, and it was irregular and hypoechoic.  The left axilla was sonographically benign.  Biopsy of the left breast mass in question May 14, 2017 showed [phone]) and invasive ductal carcinoma, grade 2 or more likely 3, estrogen and progesterone receptor negative, with no HER-2 amplification, the signals ratio being 1.18 and the number per cell 1.65.  The key 67 was 80%.  The patient's subsequent history is as detailed below.  INTERVAL HISTORY: Amy Dawson returns today for follow up and treatment of her triple negative and newly metastatic breast cancer.  She is having pain all over her body.  Her breast tumor continues to grow.  She is tired.    REVIEW OF SYSTEMS: Amy Dawson was started on  oxycodone q4h prn yesterday.  She is taking 6 pills per day.  It relieves her pain but has unfortunately left her constipated.  Her last bowel movement was 1 week ago. She is taking colace and Miralax daily and hasn't noted any improved.  Other than her pain, fatigue, and constipation her detailed ROS is stable.     PAST MEDICAL HISTORY: Past Medical History:  Diagnosis Date  . Allergy 1993   Per patient 02/28/18: Diagnosed approximately 1993.  Marland Kitchen Anxiety 1995   Per patient 02/28/18: Diagnosed approximately 1995  . Breast cancer ([place])   . Chronic kidney disease 2019   bladder issues; Per patient 02/28/18, experiences burning with urination with certain food and beverages (caffeine, spicy foods, etc.).  Experienced for several years until on Keto diet, then symptoms reccured after stopping keto diet in Feb 2019  . Depression 2002   Per patient 02/28/18: diagnosed approximately 2002, does not have bipolar depression.  . Family history of breast cancer   . Fibromyalgia 2002   Per patient 02/28/18: diagnosed approximately 2002  . Genetic testing 06/13/2017   Breast/GYN panel (23 genes) @ [facility] - No pathogenic mutations detected  . Headache 1993   smokes marijuana for migraine pain; Per patient 02/28/18:began in approximately 1993, experiences tension headaches and migraine headaches  . Murmur, cardiac 1978   as a child; Per patient 02/28/18: diagnosed approximately 1978  . Nausea 1978   Per patient 02/28/18: experiences occassional nausea since about age 41  . Personal history of chemotherapy 2018  . Stroke ([place]) 2000   numbness on left side; Per  patient 02/28/18: partial numbness but retains full use of left side, is not limited  . TMJ (dislocation of temporomandibular joint) 1995   Per patient 02/28/18: diagnosed in approximately 1995    PAST SURGICAL HISTORY: Past Surgical History:  Procedure Laterality Date  . BREAST BIOPSY Left 05/14/2017   malignant  . BREAST LUMPECTOMY WITH AXILLARY  LYMPH NODE BIOPSY Left 11/13/2017   Procedure: LEFT BREAST LUMPECTOMY WITH SENTINEL LYMPH NODE BIOPSY;  Surgeon: [name], MD;  Location: [facility];  Service: General;  Laterality: Left;  . CESAREAN SECTION     2  . ENDOMETRIAL ABLATION  2011  . PORTACATH PLACEMENT Right 05/31/2017   Procedure: INSERTION PORT-A-CATH WITH [place];  Surgeon: [name], MD;  Location: [facility];  Service: General;  Laterality: Right;    FAMILY HISTORY Family History  Problem Relation Age of Onset  . Liver cancer Sister        "rare type"; deceased 65  . Breast cancer Maternal Aunt        Dx 45s; currently 6s  . Breast cancer Paternal Grandmother        Dx 76s; deceased 59s  . Cancer Paternal Aunt        unk. type; deceased 71  . Colon cancer Neg Hx   . Esophageal cancer Neg Hx   . Rectal cancer Neg Hx   . Stomach cancer Neg Hx   The patient's parents are in their mid 68s as of November 2018.  The patient had 1 brother, 1 sister.  The sister died from a rare liver cancer at age 54.  A maternal aunt and a paternal grandmother were diagnosed with breast cancer in their 58s.  GYNECOLOGIC HISTORY:  No LMP recorded. Patient has had an ablation. Menarche age 23.  She underwent endometrial ablation 2011.  She is no longer having periods.  She never used hormone replacement.  First live birth was age 56.  She is GX P2.   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  Her husband Amy Dawson works as an Firefighter for [employer]  Daughter Amy Dawson attends [employer].  Son Amy Dawson is doing on apprenticeship in the [employer] program.    ADVANCED DIRECTIVES: Not in place   HEALTH MAINTENANCE: Social History   Tobacco Use  . Smoking status: Former Research scientist (life sciences)  . Smokeless tobacco: Never Used  Substance Use Topics  . Alcohol use: Yes    Alcohol/week: 0.0 standard drinks    Comment: sometimes  . Drug use: Yes     Types: Marijuana    Comment: last smoked 11-10-17 ("for anxiety"), uses daily     Colonoscopy: 2016/[facility]  PAP: October 2018  Bone density: Never   Allergies  Allergen Reactions  . Other Other (See Comments)    Red wine & white cheese: migraine Raw egg whites- hives    Current Outpatient Medications  Medication Sig Dispense Refill  . amitriptyline (ELAVIL) 50 MG tablet Take 25 mg by mouth at bedtime as needed for sleep. Patient taking differently; take 50 mg daily.    Marland Kitchen azelastine (OPTIVAR) 0.05 % ophthalmic solution INSTILL 1 DROP INTO EACH EYE AS NEEDED  1  . Calcium Glycerophosphate (PRELIEF PO) Take 2 tablets by mouth as needed (dysuria).     . CALCIUM PO Take 1 tablet by mouth daily.    . Cetirizine HCl (ZYRTEC ALLERGY PO) Take 10 mg by mouth daily.     Marland Kitchen  Cholecalciferol (VITAMIN D PO) Take by mouth. 1000 units daily    . dexamethasone (DECADRON) 2 MG tablet Take 1 tablet (2 mg total) by mouth 2 (two) times daily. 60 tablet 0  . dimenhyDRINATE (DRAMAMINE) 50 MG tablet Take 50 mg by mouth every 8 (eight) hours as needed for nausea.     . fluticasone (FLONASE) 50 MCG/ACT nasal spray Place 2 sprays into both nostrils daily.    Marland Kitchen LORazepam (ATIVAN) 0.5 MG tablet Take 1 tablet (0.5 mg total) by mouth at bedtime. 30 tablet 0  . Multiple Vitamin (MULTIVITAMIN) tablet Take 1 tablet by mouth daily.    . Naproxen Sod-diphenhydrAMINE (ALEVE PM PO) Take by mouth at bedtime.    . ondansetron (ZOFRAN) 8 MG tablet Take 1 tablet (8 mg total) by mouth every 8 (eight) hours as needed for nausea or vomiting. 30 tablet 1  . OVER THE COUNTER MEDICATION Take 2 tablets by mouth as needed (excedrine - tension headache; for headache).     . OVER THE COUNTER MEDICATION Take 2 tablets by mouth as needed (Excedrine - pain reliever; for joint pain associated with fibromyalgia (acetaminophen 258m+Asprin 2539mcaffeine 6536m.     . oMarland KitchenyCODONE (OXY IR/ROXICODONE) 5 MG immediate release tablet Take 2 tablets  (10 mg total) by mouth every 4 (four) hours as needed for severe pain. 120 tablet 0  . Pseudoephedrine HCl (SUDAFED PO) Take 1 tablet by mouth as needed (psuedoephedrine HCl 84m72m    . Pseudoephedrine-guaiFENesin (MUCINEX D PO) Take 1 tablet by mouth as needed (guaifenesin 1200mg84mudoephedrine HCl 120mg)10m   No current facility-administered medications for this visit.     OBJECTIVE:   Vitals:   04/18/18 1121  BP: 117/75  Pulse: (!) 109  Resp: 17  Temp: 97.6 F (36.4 C)  SpO2: 95%     Body mass index is 23 kg/m.   Wt Readings from Last 3 Encounters:  04/18/18 154 lb 9.6 oz (70.1 kg)  04/10/18 158 lb 3.2 oz (71.8 kg)  03/26/18 163 lb 11.2 oz (74.3 kg)  ECOG FS:1 - Symptomatic but completely ambulatory  GENERAL: Patient is a well appearing female in no acute distress HEENT:  Sclerae anicteric.  Oropharynx clear and moist. No ulcerations or evidence of oropharyngeal candidiasis. Neck is supple.  NODES:  No cervical, supraclavicular, or axillary lymphadenopathy palpated.  BREAST EXAM:  Right sided breast tumor is larger, skin remains intact LUNGS:  Clear to auscultation bilaterally.  No wheezes or rhonchi. HEART:  Regular rate and rhythm. No murmur appreciated. ABDOMEN:  Soft, nontender.  Positive, normoactive bowel sounds. No organomegaly palpated. MSK:  No focal spinal tenderness to palpation. Full range of motion bilaterally in the upper extremities. EXTREMITIES:  No peripheral edema.   SKIN:  Clear with no obvious rashes or skin changes. No nail dyscrasia. NEURO:  Nonfocal. Well oriented.  Appropriate affect.    LAB RESULTS:  CMP     Component Value Date/Time   NA 138 03/21/2018 0847   NA 138 07/10/2017 1458   K 3.6 03/21/2018 0847   K 3.7 07/10/2017 1458   CL 99 03/21/2018 0847   CO2 27 03/21/2018 0847   CO2 25 07/10/2017 1458   GLUCOSE 126 (H) 03/21/2018 0847   GLUCOSE 95 07/10/2017 1458   BUN 9 03/21/2018 0847   BUN 7.4 07/10/2017 1458   CREATININE 0.84  03/21/2018 0847   CREATININE 0.7 07/10/2017 1458   CALCIUM 9.8 03/21/2018 0847   CALCIUM 8.9 07/10/2017 1458  PROT 7.9 03/21/2018 0847   PROT 7.1 07/10/2017 1458   ALBUMIN 3.6 03/21/2018 0847   ALBUMIN 3.8 07/10/2017 1458   AST 21 03/21/2018 0847   AST 10 07/10/2017 1458   ALT 9 03/21/2018 0847   ALT 11 07/10/2017 1458   ALKPHOS 111 03/21/2018 0847   ALKPHOS 92 07/10/2017 1458   BILITOT 0.4 03/21/2018 0847   BILITOT <0.22 07/10/2017 1458   GFRNONAA >60 03/21/2018 0847   GFRAA >60 03/21/2018 0847    No results found for: Ronnald Ramp, A1GS, A2GS, BETS, BETA2SER, GAMS, MSPIKE, SPEI  No results found for: Nils Pyle, [facility]  Lab Results  Component Value Date   WBC 4.9 03/25/2018   NEUTROABS 3.6 03/21/2018   HGB 13.0 03/25/2018   HCT 37.9 03/25/2018   MCV 92.4 03/25/2018   PLT 291 03/25/2018      Chemistry      Component Value Date/Time   NA 138 03/21/2018 0847   NA 138 07/10/2017 1458   K 3.6 03/21/2018 0847   K 3.7 07/10/2017 1458   CL 99 03/21/2018 0847   CO2 27 03/21/2018 0847   CO2 25 07/10/2017 1458   BUN 9 03/21/2018 0847   BUN 7.4 07/10/2017 1458   CREATININE 0.84 03/21/2018 0847   CREATININE 0.7 07/10/2017 1458      Component Value Date/Time   CALCIUM 9.8 03/21/2018 0847   CALCIUM 8.9 07/10/2017 1458   ALKPHOS 111 03/21/2018 0847   ALKPHOS 92 07/10/2017 1458   AST 21 03/21/2018 0847   AST 10 07/10/2017 1458   ALT 9 03/21/2018 0847   ALT 11 07/10/2017 1458   BILITOT 0.4 03/21/2018 0847   BILITOT <0.22 07/10/2017 1458       No results found for: LABCA2  No components found for: [id number]  No results for input(s): INR in the last 168 hours.  No results found for: LABCA2  No results found for: YIF027  No results found for: XAJ287  No results found for: OMV672  No results found for: CA2729  No components found for: HGQUANT  No results found for: CEA1 / No results found for: CEA1   No results found for:  AFPTUMOR  No results found for: CHROMOGRNA  No results found for: PSA1  No visits with results within 3 Day(s) from this visit.  Latest known visit with results is:  Hospital Outpatient Visit on 03/25/2018  Component Date Value Ref Range Status  . aPTT 03/25/2018 31  24 - 36 seconds Final   Performed at [facility], [place] [address] [place], [place] [address]  . WBC 03/25/2018 4.9  4.0 - 10.5 K/uL Final  . RBC 03/25/2018 4.10  3.87 - 5.11 MIL/uL Final  . Hemoglobin 03/25/2018 13.0  12.0 - 15.0 g/dL Final  . HCT 03/25/2018 37.9  36.0 - 46.0 % Final  . MCV 03/25/2018 92.4  78.0 - 100.0 fL Final  . MCH 03/25/2018 31.7  26.0 - 34.0 pg Final  . MCHC 03/25/2018 34.3  30.0 - 36.0 g/dL Final  . RDW 03/25/2018 12.9  11.5 - 15.5 % Final  . Platelets 03/25/2018 291  150 - 400 K/uL Final   Performed at [facility], [place] [address], [place], [place] [address]  . Prothrombin Time 03/25/2018 12.9  11.4 - 15.2 seconds Final  . INR 03/25/2018 0.98   Final   Performed at [facility], [place] [address], [place], [place] [address]    (this displays the last  labs from the last 3 days)  No results found for: TOTALPROTELP, ALBUMINELP, A1GS, A2GS, BETS, BETA2SER, GAMS, MSPIKE, SPEI (this displays SPEP labs)  No results found for: KPAFRELGTCHN, LAMBDASER, KAPLAMBRATIO (kappa/lambda light chains)  No results found for: HGBA, HGBA2QUANT, HGBFQUANT, HGBSQUAN (Hemoglobinopathy evaluation)   No results found for: LDH  No results found for: IRON, TIBC, IRONPCTSAT (Iron and TIBC)  No results found for: FERRITIN  Urinalysis    Component Value Date/Time   COLORURINE YELLOW 07/07/2017 1400   APPEARANCEUR CLEAR 07/07/2017 1400   LABSPEC 1.003 (L) 07/07/2017 1400   LABSPEC 1.010 05/30/2017 1315   PHURINE 7.0 07/07/2017 1400   GLUCOSEU NEGATIVE 07/07/2017 1400   GLUCOSEU Negative 05/30/2017 1315   HGBUR NEGATIVE 07/07/2017 1400   BILIRUBINUR  NEGATIVE 07/07/2017 1400   BILIRUBINUR Color Interference 05/30/2017 1315   KETONESUR NEGATIVE 07/07/2017 1400   PROTEINUR NEGATIVE 07/07/2017 1400   UROBILINOGEN Color Interference 05/30/2017 1315   NITRITE NEGATIVE 07/07/2017 1400   LEUKOCYTESUR NEGATIVE 07/07/2017 1400   LEUKOCYTESUR Color Interference 05/30/2017 1315     STUDIES:    ELIGIBLE FOR AVAILABLE RESEARCH PROTOCOL: no  ASSESSMENT: 52 y.o. [place] status post left breast upper outer quadrant biopsy May 14, 2017 for a clinical T2 N0, stage IIB invasive ductal carcinoma, grade 3, triple negative, with an MIB-1 of 80%  (1) neoadjuvant chemotherapy consisting of doxorubicin and cyclophosphamide in dose dense fashion x4 started 06/04/2017, completed 07/17/2017; followed by weekly carboplatin and paclitaxel x12 started 07/30/2017, completed 10/22/2017  (a) echo on 05/30/2017: LVEF 55-60%  (2) Right lumpectomy and SLNB on 11/13/2017: showed ypT2 ypN0 residual invasive ductal carcinoma, grade 3, repeat prognostic panel again triple negative.  RCB-II  (3) adjuvant radiation with capecitabine sensitizaton  12/16/2017-01/31/2018, she did experience planter erythrodysesthesia with the Capecitabine, so it was not continued for six months after sensitization (total 6 weeks' treatment)   (4) genetics testing offered through [facility] 08/13/2016 showed: No deleterious mutations in ATM, BARD1, BRCA1, BRCA2, BRIP1, CDH1, CHEK2, DICER1, EPCAM*, MLH1, MSH2, MSH6, NBN, NF1, PALB2, PMS2, PTEN, RAD50, RAD51C, RAD51D, SMARCA4, STK11, TP53  (5) participated in [facility], randomized to ARM 2= pembrolizumab Q 21 days, started 02/28/2018, received second dose 03/21/2018, after which she came off study with evidence of metastatic disease  METASTATIC DISEASE Sept 2019 (6) pelvic MRI suggests bone involvement as well as subcutaneous masses in the right buttock  (a) right breast and right axillary lymph node biopsy 03/28/2018 confirms recurrent triple  negative breast cancer  (b) right buttock mass biopsy 03/25/2018,  positive for metastatic breast cancer, triple negative, with an MIB-1 of 80%  (c) CT scans of brain chest abdomen and pelvis, scan and brain MRI show involvement of liver, lungs, bones, and brain  (d) PD-L1 and Foundation One studies on 03/25/2018 obtained: See separately scanned results  (7) whole brain irradiation started on 04/14/2018, to be completed 04/18/2018  (8) systemic chemotherapy and immunotherapy to consist of eribulin days 1 and 8 of each 21-day cycle with pembrolizumab day 1 of each cycle, starting 04/21/2018  PLAN: Ivyana is here today for f/u regarding her pain regimen and bowel regimen.  The oxycodone is working and she will continue this for now, with consideration of adding long acting oxycodone in the near future.  Her main issue is the long time without a bowel movement.  I ordered an abdominal film due to the extent of her metastatic disease.  I recommended miralax daily to bid, and senokot S 2 tab po bid.  She verbalized understanding and knows to let us know if that doesn't work or if she needs anything else for her stool.    She will return on Monday to start chemo. She knows to call for any questions or concerns prior to her next appointment with [place].    A total of (20) minutes of face-to-face time was spent with this patient with greater than 50% of that time in counseling and care-coordination.    Wilber Bihari, NP 04/18/18 11:29 AM Medical Oncology and Hematology [facility] [address] [place], [place] [address] Tel. [phone]    Fax. [phone]

## 2018-04-21 ENCOUNTER — Ambulatory Visit: Payer: 59

## 2018-04-21 ENCOUNTER — Inpatient Hospital Stay: Payer: 59

## 2018-04-21 ENCOUNTER — Telehealth: Payer: Self-pay | Admitting: Adult Health

## 2018-04-21 ENCOUNTER — Encounter (HOSPITAL_COMMUNITY): Payer: Self-pay | Admitting: Oncology

## 2018-04-21 ENCOUNTER — Encounter: Payer: Self-pay | Admitting: Adult Health

## 2018-04-21 ENCOUNTER — Inpatient Hospital Stay (HOSPITAL_BASED_OUTPATIENT_CLINIC_OR_DEPARTMENT_OTHER): Payer: 59 | Admitting: Adult Health

## 2018-04-21 VITALS — HR 99

## 2018-04-21 VITALS — BP 107/68 | HR 111 | Temp 97.8°F | Resp 18 | Ht 68.75 in | Wt 155.5 lb

## 2018-04-21 DIAGNOSIS — C7800 Secondary malignant neoplasm of unspecified lung: Secondary | ICD-10-CM | POA: Insufficient documentation

## 2018-04-21 DIAGNOSIS — Z79899 Other long term (current) drug therapy: Secondary | ICD-10-CM

## 2018-04-21 DIAGNOSIS — Z171 Estrogen receptor negative status [ER-]: Secondary | ICD-10-CM | POA: Diagnosis not present

## 2018-04-21 DIAGNOSIS — R530 Neoplastic (malignant) related fatigue: Secondary | ICD-10-CM

## 2018-04-21 DIAGNOSIS — Z87891 Personal history of nicotine dependence: Secondary | ICD-10-CM

## 2018-04-21 DIAGNOSIS — N6310 Unspecified lump in the right breast, unspecified quadrant: Secondary | ICD-10-CM

## 2018-04-21 DIAGNOSIS — C50412 Malignant neoplasm of upper-outer quadrant of left female breast: Secondary | ICD-10-CM

## 2018-04-21 DIAGNOSIS — K5903 Drug induced constipation: Secondary | ICD-10-CM

## 2018-04-21 DIAGNOSIS — Z923 Personal history of irradiation: Secondary | ICD-10-CM

## 2018-04-21 DIAGNOSIS — G893 Neoplasm related pain (acute) (chronic): Secondary | ICD-10-CM

## 2018-04-21 DIAGNOSIS — C7989 Secondary malignant neoplasm of other specified sites: Secondary | ICD-10-CM

## 2018-04-21 DIAGNOSIS — Z803 Family history of malignant neoplasm of breast: Secondary | ICD-10-CM

## 2018-04-21 DIAGNOSIS — Z9221 Personal history of antineoplastic chemotherapy: Secondary | ICD-10-CM

## 2018-04-21 DIAGNOSIS — C787 Secondary malignant neoplasm of liver and intrahepatic bile duct: Secondary | ICD-10-CM

## 2018-04-21 DIAGNOSIS — Z95828 Presence of other vascular implants and grafts: Secondary | ICD-10-CM

## 2018-04-21 DIAGNOSIS — C7951 Secondary malignant neoplasm of bone: Secondary | ICD-10-CM

## 2018-04-21 DIAGNOSIS — Z7189 Other specified counseling: Secondary | ICD-10-CM

## 2018-04-21 DIAGNOSIS — Z800 Family history of malignant neoplasm of digestive organs: Secondary | ICD-10-CM

## 2018-04-21 DIAGNOSIS — C7931 Secondary malignant neoplasm of brain: Secondary | ICD-10-CM | POA: Insufficient documentation

## 2018-04-21 LAB — CBC WITH DIFFERENTIAL/PLATELET
Basophils Absolute: 0 10*3/uL (ref 0.0–0.1)
Basophils Relative: 0 %
Eosinophils Absolute: 0.1 10*3/uL (ref 0.0–0.5)
Eosinophils Relative: 2 %
HEMATOCRIT: 32.4 % — AB (ref 34.8–46.6)
HEMOGLOBIN: 10.4 g/dL — AB (ref 11.6–15.9)
LYMPHS PCT: 8 %
Lymphs Abs: 0.4 10*3/uL — ABNORMAL LOW (ref 0.9–3.3)
MCH: 29.7 pg (ref 25.1–34.0)
MCHC: 32.1 g/dL (ref 31.5–36.0)
MCV: 92.6 fL (ref 79.5–101.0)
MONOS PCT: 12 %
Monocytes Absolute: 0.6 10*3/uL (ref 0.1–0.9)
NEUTROS ABS: 4.1 10*3/uL (ref 1.5–6.5)
NEUTROS PCT: 78 %
Platelets: 248 10*3/uL (ref 145–400)
RBC: 3.5 MIL/uL — ABNORMAL LOW (ref 3.70–5.45)
RDW: 13.9 % (ref 11.2–14.5)
WBC: 5.2 10*3/uL (ref 3.9–10.3)

## 2018-04-21 LAB — CMP (CANCER CENTER ONLY)
ALT: 11 U/L (ref 0–44)
AST: 51 U/L — ABNORMAL HIGH (ref 15–41)
Albumin: 2.5 g/dL — ABNORMAL LOW (ref 3.5–5.0)
Alkaline Phosphatase: 131 U/L — ABNORMAL HIGH (ref 38–126)
Anion gap: 13 (ref 5–15)
BILIRUBIN TOTAL: 0.5 mg/dL (ref 0.3–1.2)
BUN: 11 mg/dL (ref 6–20)
CHLORIDE: 93 mmol/L — AB (ref 98–111)
CO2: 30 mmol/L (ref 22–32)
Calcium: 9.6 mg/dL (ref 8.9–10.3)
Creatinine: 0.67 mg/dL (ref 0.44–1.00)
Glucose, Bld: 90 mg/dL (ref 70–99)
POTASSIUM: 3.8 mmol/L (ref 3.5–5.1)
SODIUM: 136 mmol/L (ref 135–145)
Total Protein: 6.9 g/dL (ref 6.5–8.1)

## 2018-04-21 LAB — TSH: TSH: 3.62 u[IU]/mL (ref 0.308–3.960)

## 2018-04-21 MED ORDER — SODIUM CHLORIDE 0.9% FLUSH
10.0000 mL | Freq: Once | INTRAVENOUS | Status: AC
  Administered 2018-04-21: 10 mL
  Filled 2018-04-21: qty 10

## 2018-04-21 MED ORDER — SODIUM CHLORIDE 0.9 % IV SOLN
Freq: Once | INTRAVENOUS | Status: AC
  Administered 2018-04-21: 10:00:00 via INTRAVENOUS
  Filled 2018-04-21: qty 250

## 2018-04-21 MED ORDER — SODIUM CHLORIDE 0.9 % IV SOLN
200.0000 mg | Freq: Once | INTRAVENOUS | Status: AC
  Administered 2018-04-21: 200 mg via INTRAVENOUS
  Filled 2018-04-21: qty 8

## 2018-04-21 MED ORDER — PROCHLORPERAZINE MALEATE 10 MG PO TABS
10.0000 mg | ORAL_TABLET | Freq: Once | ORAL | Status: AC
  Administered 2018-04-21: 10 mg via ORAL

## 2018-04-21 MED ORDER — SODIUM CHLORIDE 0.9 % IV SOLN
1.4000 mg/m2 | Freq: Once | INTRAVENOUS | Status: AC
  Administered 2018-04-21: 2.6 mg via INTRAVENOUS
  Filled 2018-04-21: qty 5.2

## 2018-04-21 MED ORDER — PROCHLORPERAZINE MALEATE 10 MG PO TABS
ORAL_TABLET | ORAL | Status: AC
  Filled 2018-04-21: qty 1

## 2018-04-21 MED ORDER — SODIUM CHLORIDE 0.9% FLUSH
10.0000 mL | INTRAVENOUS | Status: DC | PRN
  Administered 2018-04-21: 10 mL
  Filled 2018-04-21: qty 10

## 2018-04-21 MED ORDER — HEPARIN SOD (PORK) LOCK FLUSH 100 UNIT/ML IV SOLN
500.0000 [IU] | Freq: Once | INTRAVENOUS | Status: AC | PRN
  Administered 2018-04-21: 500 [IU]
  Filled 2018-04-21: qty 5

## 2018-04-21 NOTE — Telephone Encounter (Signed)
Pt sched per 10/7 los.  Pt will receive update on mychart

## 2018-04-21 NOTE — Telephone Encounter (Signed)
Per 10/4 los, no los orders.

## 2018-04-21 NOTE — Patient Instructions (Addendum)
[facility] Discharge Instructions for Patients Receiving Chemotherapy  Today you received the following chemotherapy agents Halaven and Keytruda  To help prevent nausea and vomiting after your treatment, we encourage you to take your nausea medication as directed.    If you develop nausea and vomiting that is not controlled by your nausea medication, call the clinic.   BELOW ARE SYMPTOMS THAT SHOULD BE REPORTED IMMEDIATELY:  *FEVER GREATER THAN 100.5 F  *CHILLS WITH OR WITHOUT FEVER  NAUSEA AND VOMITING THAT IS NOT CONTROLLED WITH YOUR NAUSEA MEDICATION  *UNUSUAL SHORTNESS OF BREATH  *UNUSUAL BRUISING OR BLEEDING  TENDERNESS IN MOUTH AND THROAT WITH OR WITHOUT PRESENCE OF ULCERS  *URINARY PROBLEMS  *BOWEL PROBLEMS  UNUSUAL RASH Items with * indicate a potential emergency and should be followed up as soon as possible.  Feel free to call the clinic should you have any questions or concerns. The clinic phone number is [phone].  Please show the [facility] at check-in to the Emergency Department and triage nurse.  Eribulin solution for injection What is this medicine? ERIBULIN (er e bu lin) is a chemotherapy drug. It is used to treat breast cancer and liposarcoma. This medicine may be used for other purposes; ask your health care provider or pharmacist if you have questions. COMMON BRAND NAME(S): Halaven What should I tell my health care provider before I take this medicine? They need to know if you have any of these conditions: -heart disease -history of irregular heartbeat -kidney disease -liver disease -low blood counts, like low white cell, platelet, or red cell counts -low levels of potassium or magnesium in the blood -an unusual or allergic reaction to eribulin, other medicines, foods, dyes, or preservatives -pregnant or trying to get pregnant -breast-feeding How should I use this medicine? This medicine is for infusion into a vein. It  is given by a health care professional in a hospital or clinic setting. Talk to your pediatrician regarding the use of this medicine in children. Special care may be needed. Overdosage: If you think you have taken too much of this medicine contact a poison control center or emergency room at once. NOTE: This medicine is only for you. Do not share this medicine with others. What if I miss a dose? It is important not to miss your dose. Call your doctor or health care professional if you are unable to keep an appointment. What may interact with this medicine? Do not take this medicine with any of the following medications: -amiodarone -astemizole -arsenic trioxide -bepridil -bretylium -chloroquine -chlorpromazine -cisapride -clarithromycin -dextromethorphan, quinidine -disopyramide -dofetilide -droperidol -dronedarone -erythromycin -grepafloxacin -halofantrine -haloperidol -ibutilide -levomethadyl -mesoridazine -methadone -pentamidine -procainamide -quinidine -pimozide -posaconazole -probucol -propafenone -saquinavir -sotalol -sparfloxacin -terfenadine -thioridazine -troleandomycin -ziprasidone This list may not describe all possible interactions. Give your health care provider a list of all the medicines, herbs, non-prescription drugs, or dietary supplements you use. Also tell them if you smoke, drink alcohol, or use illegal drugs. Some items may interact with your medicine. What should I watch for while using this medicine? This drug may make you feel generally unwell. This is not uncommon, as chemotherapy can affect healthy cells as well as cancer cells. Report any side effects. Continue your course of treatment even though you feel ill unless your doctor tells you to stop. Call your doctor or health care professional for advice if you get a fever, chills or sore throat, or other symptoms of a cold or flu. Do not treat yourself. This  drug decreases your body's ability  to fight infections. Try to avoid being around people who are sick. This medicine may increase your risk to bruise or bleed. Call your doctor or health care professional if you notice any unusual bleeding. You may need blood work done while you are taking this medicine. Do not become pregnant while taking this medicine or for 2 weeks after stopping it. Women should inform their doctor if they wish to become pregnant or think they might be pregnant. Men should not father a child while taking this medicine and for 3.5 months after stopping it. There is a potential for serious side effects to an unborn child. Talk to your health care professional or pharmacist for more information. Do not breast-feed an infant while taking this medicine or for 2 weeks after stopping it. What side effects may I notice from receiving this medicine? Side effects that you should report to your doctor or health care professional as soon as possible: -allergic reactions like skin rash, itching or hives, swelling of the face, lips, or tongue -low blood counts - this medicine may decrease the number of white blood cells, red blood cells and platelets. You may be at increased risk for infections and bleeding. -signs of infection - fever or chills, cough, sore throat, pain or difficulty passing urine -signs of decreased platelets or bleeding - bruising, pinpoint red spots on the skin, black, tarry stools, blood in the urine -signs of decreased red blood cells - unusually weak or tired, fainting spells, lightheadedness -pain, tingling, numbness in the hands or feet Side effects that usually do not require medical attention (report to your doctor or health care professional if they continue or are bothersome): -constipation -hair loss -headache -loss of appetite -muscle or joint pain -nausea, vomiting -stomach pain This list may not describe all possible side effects. Call your doctor for medical advice about side effects. You  may report side effects to FDA at 1-800-FDA-1088. Where should I keep my medicine? This drug is given in a hospital or clinic and will not be stored at home. NOTE: This sheet is a summary. It may not cover all possible information. If you have questions about this medicine, talk to your doctor, pharmacist, or health care provider.  2018 [employer] Standard (2015-08-04 10:11:26)  Pembrolizumab injection What is this medicine? PEMBROLIZUMAB (pem broe liz ue mab) is a monoclonal antibody. It is used to treat melanoma, head and neck cancer, Hodgkin lymphoma, non-small cell lung cancer, urothelial cancer, stomach cancer, and cancers that have a certain genetic condition. This medicine may be used for other purposes; ask your health care provider or pharmacist if you have questions. COMMON BRAND NAME(S): Keytruda What should I tell my health care provider before I take this medicine? They need to know if you have any of these conditions: -diabetes -immune system problems -inflammatory bowel disease -liver disease -lung or breathing disease -lupus -organ transplant -an unusual or allergic reaction to pembrolizumab, other medicines, foods, dyes, or preservatives -pregnant or trying to get pregnant -breast-feeding How should I use this medicine? This medicine is for infusion into a vein. It is given by a health care professional in a hospital or clinic setting. A special MedGuide will be given to you before each treatment. Be sure to read this information carefully each time. Talk to your pediatrician regarding the use of this medicine in children. While this drug may be prescribed for selected conditions, precautions do apply. Overdosage: If you think you have taken  too much of this medicine contact a poison control center or emergency room at once. NOTE: This medicine is only for you. Do not share this medicine with others. What if I miss a dose? It is important not to miss your dose. Call  your doctor or health care professional if you are unable to keep an appointment. What may interact with this medicine? Interactions have not been studied. Give your health care provider a list of all the medicines, herbs, non-prescription drugs, or dietary supplements you use. Also tell them if you smoke, drink alcohol, or use illegal drugs. Some items may interact with your medicine. This list may not describe all possible interactions. Give your health care provider a list of all the medicines, herbs, non-prescription drugs, or dietary supplements you use. Also tell them if you smoke, drink alcohol, or use illegal drugs. Some items may interact with your medicine. What should I watch for while using this medicine? Your condition will be monitored carefully while you are receiving this medicine. You may need blood work done while you are taking this medicine. Do not become pregnant while taking this medicine or for 4 months after stopping it. Women should inform their doctor if they wish to become pregnant or think they might be pregnant. There is a potential for serious side effects to an unborn child. Talk to your health care professional or pharmacist for more information. Do not breast-feed an infant while taking this medicine or for 4 months after the last dose. What side effects may I notice from receiving this medicine? Side effects that you should report to your doctor or health care professional as soon as possible: -allergic reactions like skin rash, itching or hives, swelling of the face, lips, or tongue -bloody or black, tarry -breathing problems -changes in vision -chest pain -chills -constipation -cough -dizziness or feeling faint or lightheaded -fast or irregular heartbeat -fever -flushing -hair loss -low blood counts - this medicine may decrease the number of white blood cells, red blood cells and platelets. You may be at increased risk for infections and bleeding. -muscle  pain -muscle weakness -persistent headache -signs and symptoms of high blood sugar such as dizziness; dry mouth; dry skin; fruity breath; nausea; stomach pain; increased hunger or thirst; increased urination -signs and symptoms of kidney injury like trouble passing urine or change in the amount of urine -signs and symptoms of liver injury like dark urine, light-colored stools, loss of appetite, nausea, right upper belly pain, yellowing of the eyes or skin -stomach pain -sweating -weight loss Side effects that usually do not require medical attention (report to your doctor or health care professional if they continue or are bothersome): -decreased appetite -diarrhea -tiredness This list may not describe all possible side effects. Call your doctor for medical advice about side effects. You may report side effects to FDA at 1-800-FDA-1088. Where should I keep my medicine? This drug is given in a hospital or clinic and will not be stored at home. NOTE: This sheet is a summary. It may not cover all possible information. If you have questions about this medicine, talk to your doctor, pharmacist, or health care provider.  2018 [employer]vier/Gold Standard (2016-04-10 12:29:36)

## 2018-04-21 NOTE — Progress Notes (Signed)
[name]  [phone] [phone]) [phone]     ID: Amy Dawson DOB: 1966-04-27  MR#: [id number]  DEY#:[id number]  Patient Care Team: [name], MD as PCP - General (Family Medicine) [name], Amy Dad, MD as Consulting Physician (Oncology) [name], MD as Consulting Physician (General Surgery) Amy Rudd, MD as Consulting Physician (Radiation Oncology) [name], NP as Nurse Practitioner (Obstetrics and Gynecology) OTHER MD:  CHIEF COMPLAINT: Triple negative breast cancer  CURRENT TREATMENT: to resume pembrolizumab, and start eribulin  HISTORY OF CURRENT ILLNESS: From the original intake note:  Amy Dawson felt some discomfort in the left breast in June 2018, but did not think much of it.  However in October she palpated a change in the upper outer quadrant of her left breast and she brought this to medical attention.  On May 09, 2017 she had bilateral diagnostic mammography with tomography at the breast center, showing the breast density to be category capital C.  In the left breast that was not obscured mass in the upper outer quadrant, which by palpation was identified at the 1230 o'clock radiant 5 cm from the nipple.  By ultrasound this measured 2.9 cm, and it was irregular and hypoechoic.  The left axilla was sonographically benign.  Biopsy of the left breast mass in question May 14, 2017 showed [phone]) and invasive ductal carcinoma, grade 2 or more likely 3, estrogen and progesterone receptor negative, with no HER-2 amplification, the signals ratio being 1.18 and the number per cell 1.65.  The key 67 was 80%.  The patient's subsequent history is as detailed below.  INTERVAL HISTORY: Amy Dawson returns today for follow up and treatment of her triple negative and newly metastatic breast cancer.  She is starting chemotherapy with Eribulin given on days 1 and 8, and Pembrolizumab on day 1 of a 21 day cycle.    REVIEW OF  SYSTEMS: Calandra remains constipated.  She has not had a bowel movement in over a week.  I increased her bowel regimen on Friday.  She is taking Miralax bid, and senokot s 2 tab po bid.  She did complete radiation on Friday.  She notes her pain is slightly improved.    She continues on Oxycodone for pain.  She is taking 74m every 4-5 hours.  She notes an incomplete bladder emptying sensation.  She says she will urinate, and then sit down, and feel like she still might need to use the restroom.  She is down to 248mBID on the dexamethasone.  She is toelrting this moderately well.    She tells me that she has anti nausea medications at home to take.    PAST MEDICAL HISTORY: Past Medical History:  Diagnosis Date  . Allergy 1993   Per patient 02/28/18: Diagnosed approximately 1993.  . Marland Kitchennxiety 1995   Per patient 02/28/18: Diagnosed approximately 1995  . Breast cancer ([facility]  . Chronic kidney disease 2019   bladder issues; Per patient 02/28/18, experiences burning with urination with certain food and beverages (caffeine, spicy foods, etc.).  Experienced for several years until on Keto diet, then symptoms reccured after stopping keto diet in Feb 2019  . Depression 2002   Per patient 02/28/18: diagnosed approximately 2002, does not have bipolar depression.  . Family history of breast cancer   . Fibromyalgia 2002   Per patient 02/28/18: diagnosed approximately 2002  . Genetic testing 06/13/2017   Breast/GYN panel (23 genes) @ [facility] - No pathogenic mutations detected  . Headache  1993   smokes marijuana for migraine pain; Per patient 02/28/18:began in approximately 1993, experiences tension headaches and migraine headaches  . Murmur, cardiac 1978   as a child; Per patient 02/28/18: diagnosed approximately 1978  . Nausea 1978   Per patient 02/28/18: experiences occassional nausea since about age 52  . Personal history of chemotherapy 2018  . Stroke [facility]) 2000   numbness on left side; Per patient 02/28/18:  partial numbness but retains full use of left side, is not limited  . TMJ (dislocation of temporomandibular joint) 1995   Per patient 02/28/18: diagnosed in approximately 1995    PAST SURGICAL HISTORY: Past Surgical History:  Procedure Laterality Date  . BREAST BIOPSY Left 05/14/2017   malignant  . BREAST LUMPECTOMY WITH AXILLARY LYMPH NODE BIOPSY Left 11/13/2017   Procedure: LEFT BREAST LUMPECTOMY WITH SENTINEL LYMPH NODE BIOPSY;  Surgeon: [name], MD;  Location: [place];  Service: General;  Laterality: Left;  . CESAREAN SECTION     2  . ENDOMETRIAL ABLATION  2011  . PORTACATH PLACEMENT Right 05/31/2017   Procedure: INSERTION PORT-A-CATH WITH [place];  Surgeon: [name], MD;  Location: [place];  Service: General;  Laterality: Right;    FAMILY HISTORY Family History  Problem Relation Age of Onset  . Liver cancer Sister        "rare type"; deceased 79  . Breast cancer Maternal Aunt        Dx 75s; currently 13s  . Breast cancer Paternal Grandmother        Dx 29s; deceased 17s  . Cancer Paternal Aunt        unk. type; deceased 61  . Colon cancer Neg Hx   . Esophageal cancer Neg Hx   . Rectal cancer Neg Hx   . Stomach cancer Neg Hx   The patient's parents are in their mid 28s as of November 2018.  The patient had 1 brother, 1 sister.  The sister died from a rare liver cancer at age 63.  A maternal aunt and a paternal grandmother were diagnosed with breast cancer in their 45s.  GYNECOLOGIC HISTORY:  No LMP recorded. Patient has had an ablation. Menarche age 19.  She underwent endometrial ablation 2011.  She is no longer having periods.  She never used hormone replacement.  First live birth was age 79.  She is GX P2.   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  Her husband [name] works as an Firefighter for [employer]  Daughter Amy Dawson attends [employer].  Son Amy Dawson is doing on apprenticeship in the  [employer] program.    ADVANCED DIRECTIVES: Not in place   HEALTH MAINTENANCE: Social History   Tobacco Use  . Smoking status: Former Research scientist (life sciences)  . Smokeless tobacco: Never Used  Substance Use Topics  . Alcohol use: Yes    Alcohol/week: 0.0 standard drinks    Comment: sometimes  . Drug use: Yes    Types: Marijuana    Comment: last smoked 11-10-17 ("for anxiety"), uses daily     Colonoscopy: 2016/[facility]  PAP: October 2018  Bone density: Never   Allergies  Allergen Reactions  . Other Other (See Comments)    Red wine & white cheese: migraine Raw egg whites- hives    Current Outpatient Medications  Medication Sig Dispense Refill  . amitriptyline (ELAVIL) 50 MG tablet Take 25 mg by mouth at bedtime as needed for sleep. Patient  taking differently; take 50 mg daily.    Marland Kitchen azelastine (OPTIVAR) 0.05 % ophthalmic solution INSTILL 1 DROP INTO EACH EYE AS NEEDED  1  . Calcium Glycerophosphate (PRELIEF PO) Take 2 tablets by mouth as needed (dysuria).     . CALCIUM PO Take 1 tablet by mouth daily.    . Cetirizine HCl (ZYRTEC ALLERGY PO) Take 10 mg by mouth daily.     . Cholecalciferol (VITAMIN D PO) Take by mouth. 1000 units daily    . dexamethasone (DECADRON) 2 MG tablet Take 1 tablet (2 mg total) by mouth 2 (two) times daily. 60 tablet 0  . dimenhyDRINATE (DRAMAMINE) 50 MG tablet Take 50 mg by mouth every 8 (eight) hours as needed for nausea.     . fluticasone (FLONASE) 50 MCG/ACT nasal spray Place 2 sprays into both nostrils daily.    Marland Kitchen LORazepam (ATIVAN) 0.5 MG tablet Take 1 tablet (0.5 mg total) by mouth at bedtime. 30 tablet 0  . Multiple Vitamin (MULTIVITAMIN) tablet Take 1 tablet by mouth daily.    . Naproxen Sod-diphenhydrAMINE (ALEVE PM PO) Take by mouth at bedtime.    . ondansetron (ZOFRAN) 8 MG tablet Take 1 tablet (8 mg total) by mouth every 8 (eight) hours as needed for nausea or vomiting. 30 tablet 1  . OVER THE COUNTER MEDICATION Take 2  tablets by mouth as needed (excedrine - tension headache; for headache).     . OVER THE COUNTER MEDICATION Take 2 tablets by mouth as needed (Excedrine - pain reliever; for joint pain associated with fibromyalgia (acetaminophen 220m+Asprin 2565mcaffeine 6580m.     . oMarland KitchenyCODONE (OXY IR/ROXICODONE) 5 MG immediate release tablet Take 2 tablets (10 mg total) by mouth every 4 (four) hours as needed for severe pain. 120 tablet 0  . Polyethylene Glycol 3350 (MIRALAX PO) Take by mouth daily.    . Pseudoephedrine HCl (SUDAFED PO) Take 1 tablet by mouth as needed (psuedoephedrine HCl 20m80m    . Pseudoephedrine-guaiFENesin (MUCINEX D PO) Take 1 tablet by mouth as needed (guaifenesin 1200mg29mudoephedrine HCl 120mg)34m  . SennOrlie Dakinm (SENNA S PO) Take by mouth. 2 tablets BID     No current facility-administered medications for this visit.     OBJECTIVE:   Vitals:   04/21/18 0915  BP: 107/68  Pulse: (!) 111  Resp: 18  Temp: 97.8 F (36.6 C)  SpO2: 96%     Body mass index is 23.13 kg/m.   Wt Readings from Last 3 Encounters:  04/21/18 155 lb 8 oz (70.5 kg)  04/18/18 154 lb 9.6 oz (70.1 kg)  04/10/18 158 lb 3.2 oz (71.8 kg)  ECOG FS:1 - Symptomatic but completely ambulatory  GENERAL: Patient is a well appearing female in no acute distress HEENT:  Sclerae anicteric.  Oropharynx clear and moist. No ulcerations or evidence of oropharyngeal candidiasis. Neck is supple.  NODES:  No cervical, supraclavicular, or axillary lymphadenopathy palpated.  BREAST EXAM:  Right sided breast tumor is larger, skin remains intact LUNGS:  Clear to auscultation bilaterally.  No wheezes or rhonchi. HEART:  Regular rate and rhythm. No murmur appreciated. ABDOMEN:  Soft, nontender.  Positive, normoactive bowel sounds. No organomegaly palpated. MSK:  No focal spinal tenderness to palpation. Full range of motion bilaterally in the upper extremities. EXTREMITIES:  No peripheral edema.   SKIN:  Clear  with no obvious rashes or skin changes. No nail dyscrasia. New subcutaneous masses on right anterior forearm, posterior neck and latteral upper posterior  torso, also noted on anterior neck.  Stable areas on right posterior upper arm and on abdomen NEURO:  Nonfocal. Well oriented.  Appropriate affect.    LAB RESULTS:  CMP     Component Value Date/Time   NA 138 03/21/2018 0847   NA 138 07/10/2017 1458   K 3.6 03/21/2018 0847   K 3.7 07/10/2017 1458   CL 99 03/21/2018 0847   CO2 27 03/21/2018 0847   CO2 25 07/10/2017 1458   GLUCOSE 126 (H) 03/21/2018 0847   GLUCOSE 95 07/10/2017 1458   BUN 9 03/21/2018 0847   BUN 7.4 07/10/2017 1458   CREATININE 0.84 03/21/2018 0847   CREATININE 0.7 07/10/2017 1458   CALCIUM 9.8 03/21/2018 0847   CALCIUM 8.9 07/10/2017 1458   PROT 7.9 03/21/2018 0847   PROT 7.1 07/10/2017 1458   ALBUMIN 3.6 03/21/2018 0847   ALBUMIN 3.8 07/10/2017 1458   AST 21 03/21/2018 0847   AST 10 07/10/2017 1458   ALT 9 03/21/2018 0847   ALT 11 07/10/2017 1458   ALKPHOS 111 03/21/2018 0847   ALKPHOS 92 07/10/2017 1458   BILITOT 0.4 03/21/2018 0847   BILITOT <0.22 07/10/2017 1458   GFRNONAA >60 03/21/2018 0847   GFRAA >60 03/21/2018 0847    No results found for: Ronnald Ramp, A1GS, A2GS, BETS, BETA2SER, GAMS, MSPIKE, SPEI  No results found for: Nils Pyle, [facility]  Lab Results  Component Value Date   WBC 4.9 03/25/2018   NEUTROABS 3.6 03/21/2018   HGB 13.0 03/25/2018   HCT 37.9 03/25/2018   MCV 92.4 03/25/2018   PLT 291 03/25/2018      Chemistry      Component Value Date/Time   NA 138 03/21/2018 0847   NA 138 07/10/2017 1458   K 3.6 03/21/2018 0847   K 3.7 07/10/2017 1458   CL 99 03/21/2018 0847   CO2 27 03/21/2018 0847   CO2 25 07/10/2017 1458   BUN 9 03/21/2018 0847   BUN 7.4 07/10/2017 1458   CREATININE 0.84 03/21/2018 0847   CREATININE 0.7 07/10/2017 1458      Component Value Date/Time   CALCIUM 9.8 03/21/2018  0847   CALCIUM 8.9 07/10/2017 1458   ALKPHOS 111 03/21/2018 0847   ALKPHOS 92 07/10/2017 1458   AST 21 03/21/2018 0847   AST 10 07/10/2017 1458   ALT 9 03/21/2018 0847   ALT 11 07/10/2017 1458   BILITOT 0.4 03/21/2018 0847   BILITOT <0.22 07/10/2017 1458       No results found for: LABCA2  No components found for: LFYBOF751  No results for input(s): INR in the last 168 hours.  No results found for: LABCA2  No results found for: WCH852  No results found for: DPO242  No results found for: PNT614  No results found for: CA2729  No components found for: HGQUANT  No results found for: CEA1 / No results found for: CEA1   No results found for: AFPTUMOR  No results found for: CHROMOGRNA  No results found for: PSA1  No visits with results within 3 Day(s) from this visit.  Latest known visit with results is:  Hospital Outpatient Visit on 03/25/2018  Component Date Value Ref Range Status  . aPTT 03/25/2018 31  24 - 36 seconds Final   Performed at [facility], [place] [address], [place], [place] [address]  . WBC 03/25/2018 4.9  4.0 - 10.5 K/uL Final  . RBC 03/25/2018 4.10  3.87 - 5.11 MIL/uL Final  .  Hemoglobin 03/25/2018 13.0  12.0 - 15.0 g/dL Final  . HCT 03/25/2018 37.9  36.0 - 46.0 % Final  . MCV 03/25/2018 92.4  78.0 - 100.0 fL Final  . MCH 03/25/2018 31.7  26.0 - 34.0 pg Final  . MCHC 03/25/2018 34.3  30.0 - 36.0 g/dL Final  . RDW 03/25/2018 12.9  11.5 - 15.5 % Final  . Platelets 03/25/2018 291  150 - 400 K/uL Final   Performed at [facility], [place] [address], [place], [place] [address]  . Prothrombin Time 03/25/2018 12.9  11.4 - 15.2 seconds Final  . INR 03/25/2018 0.98   Final   Performed at [facility], [place] [address] [place],  [address]    (this displays the last labs from the last 3 days)  No results found for: TOTALPROTELP, ALBUMINELP, A1GS, A2GS, BETS, BETA2SER, GAMS, MSPIKE, SPEI (this  displays SPEP labs)  No results found for: KPAFRELGTCHN, LAMBDASER, KAPLAMBRATIO (kappa/lambda light chains)  No results found for: HGBA, HGBA2QUANT, HGBFQUANT, HGBSQUAN (Hemoglobinopathy evaluation)   No results found for: LDH  No results found for: IRON, TIBC, IRONPCTSAT (Iron and TIBC)  No results found for: FERRITIN  Urinalysis    Component Value Date/Time   COLORURINE YELLOW 07/07/2017 1400   APPEARANCEUR CLEAR 07/07/2017 1400   LABSPEC 1.003 (L) 07/07/2017 1400   LABSPEC 1.010 05/30/2017 1315   PHURINE 7.0 07/07/2017 1400   GLUCOSEU NEGATIVE 07/07/2017 1400   GLUCOSEU Negative 05/30/2017 1315   HGBUR NEGATIVE 07/07/2017 1400   BILIRUBINUR NEGATIVE 07/07/2017 1400   BILIRUBINUR Color Interference 05/30/2017 1315   KETONESUR NEGATIVE 07/07/2017 1400   PROTEINUR NEGATIVE 07/07/2017 1400   UROBILINOGEN Color Interference 05/30/2017 1315   NITRITE NEGATIVE 07/07/2017 1400   LEUKOCYTESUR NEGATIVE 07/07/2017 1400   LEUKOCYTESUR Color Interference 05/30/2017 1315     STUDIES:    ELIGIBLE FOR AVAILABLE RESEARCH PROTOCOL: no  ASSESSMENT: 52 y.o. [place] status post left breast upper outer quadrant biopsy May 14, 2017 for a clinical T2 N0, stage IIB invasive ductal carcinoma, grade 3, triple negative, with an MIB-1 of 80%  (1) neoadjuvant chemotherapy consisting of doxorubicin and cyclophosphamide in dose dense fashion x4 started 06/04/2017, completed 07/17/2017; followed by weekly carboplatin and paclitaxel x12 started 07/30/2017, completed 10/22/2017  (a) echo on 05/30/2017: LVEF 55-60%  (2) Right lumpectomy and SLNB on 11/13/2017: showed ypT2 ypN0 residual invasive ductal carcinoma, grade 3, repeat prognostic panel again triple negative.  RCB-II  (3) adjuvant radiation with capecitabine sensitizaton  12/16/2017-01/31/2018, she did experience planter erythrodysesthesia with the Capecitabine, so it was not continued for six months after sensitization (total 6 weeks'  treatment)   (4) genetics testing offered through [facility] 08/13/2016 showed: No deleterious mutations in ATM, BARD1, BRCA1, BRCA2, BRIP1, CDH1, CHEK2, DICER1, EPCAM*, MLH1, MSH2, MSH6, NBN, NF1, PALB2, PMS2, PTEN, RAD50, RAD51C, RAD51D, SMARCA4, STK11, TP53  (5) participated in [place], randomized to ARM 2= pembrolizumab Q 21 days, started 02/28/2018, received second dose 03/21/2018, after which she came off study with evidence of metastatic disease  METASTATIC DISEASE Sept 2019 (6) pelvic MRI suggests bone involvement as well as subcutaneous masses in the right buttock  (a) right breast and right axillary lymph node biopsy 03/28/2018 confirms recurrent triple negative breast cancer  (b) right buttock mass biopsy 03/25/2018,  positive for metastatic breast cancer, triple negative, with an MIB-1 of 80%  (c) CT scans of brain chest abdomen and pelvis, scan and brain MRI show involvement of liver, lungs, bones, and brain  (  d) PD-L1 and Foundation One studies on 03/25/2018 obtained: See separately scanned results  (7) whole brain irradiation started on 04/14/2018, to be completed 04/18/2018  (8) systemic chemotherapy and immunotherapy to consist of eribulin days 1 and 8 of each 21-day cycle with pembrolizumab day 1 of each cycle, starting 04/21/2018  PLAN: Tylasia is doing well today.  Her CBC is stable and she will proceed with systemic treatment for her stage IV newly metastatic triple negative breast cancer. I reviewed her chemotherapy regimen in detail and the dates she will need to return.  I requested that she return in one week and put in the scheduling request today.  We reviewed that initially, we will see her on a week to week basis, and depending on how she does next week, we will decide if we need to see her on 10/21.   She continues on oxycodone for pain.  She will continue this as her pain is under control.  It is causing her to be constipated.  I recommended Mag Citrate for her to take  one half bottle and then repeat in 2 hours with the rest of the bottle if no BM.  She knows to drink plenty of water with this and she knows not to take more than one in 2 days.  The xray of her abdomen on Friday showed stool in the colon, but no sign of ileus or obstruction.  Savina and I reviewed her anti nausea medication.  She has Ondansetron if she needs it for nausea.  She continues on Dexamethasone 54m PO BID.    We will see Abbye back in one week for labs and f/u prior to her day 8 Eribulin chemotherapy.  She knows to call for any questions or concerns prior to her next appointment with [place]    I reviewed the above with Dr. MJana Hakimin detail.  He is in agreement with the plan above.     A total of (30) minutes of face-to-face time was spent with this patient with greater than 50% of that time in counseling and care-coordination.    LWilber Bihari NP 04/21/18 9:26 AM Medical Oncology and Hematology [facility] [address]Tel. [phone]   Fax. [phone]

## 2018-04-22 ENCOUNTER — Ambulatory Visit: Payer: 59

## 2018-04-23 ENCOUNTER — Ambulatory Visit: Payer: 59

## 2018-04-23 ENCOUNTER — Ambulatory Visit: Payer: 59 | Admitting: Adult Health

## 2018-04-23 ENCOUNTER — Other Ambulatory Visit: Payer: 59

## 2018-04-23 ENCOUNTER — Emergency Department (HOSPITAL_COMMUNITY)
Admission: EM | Admit: 2018-04-23 | Discharge: 2018-04-24 | Disposition: A | Discharge status: Home / Self Care | Payer: 59 | Attending: Emergency Medicine | Admitting: Emergency Medicine

## 2018-04-23 ENCOUNTER — Other Ambulatory Visit: Payer: Self-pay

## 2018-04-23 ENCOUNTER — Encounter (HOSPITAL_COMMUNITY): Payer: Self-pay

## 2018-04-23 DIAGNOSIS — K5903 Drug induced constipation: Secondary | ICD-10-CM | POA: Insufficient documentation

## 2018-04-23 DIAGNOSIS — C7981 Secondary malignant neoplasm of breast: Secondary | ICD-10-CM | POA: Diagnosis not present

## 2018-04-23 DIAGNOSIS — Z79899 Other long term (current) drug therapy: Secondary | ICD-10-CM | POA: Diagnosis not present

## 2018-04-23 DIAGNOSIS — Z85841 Personal history of malignant neoplasm of brain: Secondary | ICD-10-CM | POA: Insufficient documentation

## 2018-04-23 DIAGNOSIS — Z9221 Personal history of antineoplastic chemotherapy: Secondary | ICD-10-CM | POA: Insufficient documentation

## 2018-04-23 DIAGNOSIS — N189 Chronic kidney disease, unspecified: Secondary | ICD-10-CM | POA: Insufficient documentation

## 2018-04-23 DIAGNOSIS — Z85118 Personal history of other malignant neoplasm of bronchus and lung: Secondary | ICD-10-CM | POA: Insufficient documentation

## 2018-04-23 DIAGNOSIS — Z85830 Personal history of malignant neoplasm of bone: Secondary | ICD-10-CM | POA: Insufficient documentation

## 2018-04-23 DIAGNOSIS — Z87891 Personal history of nicotine dependence: Secondary | ICD-10-CM | POA: Diagnosis not present

## 2018-04-23 DIAGNOSIS — K5900 Constipation, unspecified: Secondary | ICD-10-CM | POA: Diagnosis present

## 2018-04-23 NOTE — ED Triage Notes (Signed)
Pt reports lower abdominal pain and constipation x10 days. She has tried stool softener, miralax, and mag citrate without relief. A&Ox4. Recently started on oxycontin. Denies N/V/D or fever.

## 2018-04-24 ENCOUNTER — Ambulatory Visit: Payer: 59

## 2018-04-24 LAB — CBC WITH DIFFERENTIAL/PLATELET
ABS IMMATURE GRANULOCYTES: 0.05 10*3/uL (ref 0.00–0.07)
BASOS ABS: 0 10*3/uL (ref 0.0–0.1)
Basophils Relative: 0 %
EOS PCT: 1 %
Eosinophils Absolute: 0 10*3/uL (ref 0.0–0.5)
HEMATOCRIT: 30.1 % — AB (ref 36.0–46.0)
HEMOGLOBIN: 9.7 g/dL — AB (ref 12.0–15.0)
Immature Granulocytes: 1 %
LYMPHS ABS: 0.3 10*3/uL — AB (ref 0.7–4.0)
LYMPHS PCT: 8 %
MCH: 30 pg (ref 26.0–34.0)
MCHC: 32.2 g/dL (ref 30.0–36.0)
MCV: 93.2 fL (ref 80.0–100.0)
Monocytes Absolute: 0.3 10*3/uL (ref 0.1–1.0)
Monocytes Relative: 6 %
NEUTROS ABS: 3.6 10*3/uL (ref 1.7–7.7)
NRBC: 0 % (ref 0.0–0.2)
Neutrophils Relative %: 84 %
Platelets: 212 10*3/uL (ref 150–400)
RBC: 3.23 MIL/uL — AB (ref 3.87–5.11)
RDW: 13.5 % (ref 11.5–15.5)
WBC: 4.3 10*3/uL (ref 4.0–10.5)

## 2018-04-24 LAB — COMPREHENSIVE METABOLIC PANEL
ALBUMIN: 2.5 g/dL — AB (ref 3.5–5.0)
ALK PHOS: 96 U/L (ref 38–126)
ALT: 13 U/L (ref 0–44)
ANION GAP: 15 (ref 5–15)
AST: 65 U/L — ABNORMAL HIGH (ref 15–41)
BILIRUBIN TOTAL: 1.2 mg/dL (ref 0.3–1.2)
BUN: 15 mg/dL (ref 6–20)
CALCIUM: 8.4 mg/dL — AB (ref 8.9–10.3)
CO2: 31 mmol/L (ref 22–32)
Chloride: 90 mmol/L — ABNORMAL LOW (ref 98–111)
Creatinine, Ser: 0.62 mg/dL (ref 0.44–1.00)
GFR calc Af Amer: >60 mL/min (ref >60)
GFR calc non Af Amer: >60 mL/min (ref >60)
GLUCOSE: 92 mg/dL (ref 70–99)
Potassium: 3.9 mmol/L (ref 3.5–5.1)
SODIUM: 136 mmol/L (ref 135–145)
TOTAL PROTEIN: 6.3 g/dL — AB (ref 6.5–8.1)

## 2018-04-24 MED ORDER — MINERAL OIL RE ENEM
1.0000 | ENEMA | Freq: Once | RECTAL | Status: AC
  Administered 2018-04-24: 1 via RECTAL
  Filled 2018-04-24: qty 1

## 2018-04-24 MED ORDER — POLYETHYLENE GLYCOL 3350 17 GM/SCOOP PO POWD: ORAL | 0 refills | Status: AC

## 2018-04-24 NOTE — ED Provider Notes (Signed)
[facility] DEPT Provider Note  CSN: [id number] Arrival date & time: 04/23/18 2140  Chief Complaint(s) Constipation (cancer patient)  HPI Amy Dawson is a 52 y.o. female with extensive past medical history including metastatic breast cancer currently undergoing chemo, chronic pain on narcotics, constipation who presents to the emergency department with 10 days of constipation.  Patient has tried taking over-the-counter MiraLAX (one capful per day) with no relief.  She tried an enema today which did not help.  She endorses tenesmus but no overt abdominal pain.  Some nausea without emesis.    HPI  Past Medical History Past Medical History:  Diagnosis Date  . Allergy 1993   Per patient 02/28/18: Diagnosed approximately 1993.  Marland Kitchen Anxiety 1995   Per patient 02/28/18: Diagnosed approximately 1995  . Breast cancer ([place])   . Chronic kidney disease 2019   bladder issues; Per patient 02/28/18, experiences burning with urination with certain food and beverages (caffeine, spicy foods, etc.).  Experienced for several years until on Keto diet, then symptoms reccured after stopping keto diet in Feb 2019  . Depression 2002   Per patient 02/28/18: diagnosed approximately 2002, does not have bipolar depression.  . Family history of breast cancer   . Fibromyalgia 2002   Per patient 02/28/18: diagnosed approximately 2002  . Genetic testing 06/13/2017   Breast/GYN panel (23 genes) @ [facility] - No pathogenic mutations detected  . Headache 1993   smokes marijuana for migraine pain; Per patient 02/28/18:began in approximately 1993, experiences tension headaches and migraine headaches  . Murmur, cardiac 1978   as a child; Per patient 02/28/18: diagnosed approximately 1978  . Nausea 1978   Per patient 02/28/18: experiences occassional nausea since about age 37  . Personal history of chemotherapy 2018  . Stroke [facility]) 2000   numbness on left side; Per patient 02/28/18: partial  numbness but retains full use of left side, is not limited  . TMJ (dislocation of temporomandibular joint) 1995   Per patient 02/28/18: diagnosed in approximately 1995   Patient Active Problem List   Diagnosis Date Noted  . Pulmonary metastases ([place]) 04/21/2018  . Liver metastases ([place]) 04/21/2018  . Brain metastases ([place]) 04/21/2018  . Bone metastases ([place]) 04/21/2018  . Goals of care, counseling/discussion 04/10/2018  . Mass of right breast 07/30/2017  . Port-A-Cath in place 07/02/2017  . Genetic testing 06/13/2017  . Family history of breast cancer   . Fibromyalgia 05/22/2017  . Cerebral hemorrhage ([place]) 05/22/2017  . Malignant neoplasm of upper-outer quadrant of left breast in female, estrogen receptor negative ([place]) 05/21/2017   Home Medication(s) Prior to Admission medications   Medication Sig Start Date End Date Taking? Authorizing Provider  amitriptyline (ELAVIL) 50 MG tablet Take 25 mg by mouth at bedtime as needed for sleep. Patient taking differently; take 50 mg daily.   Yes [provider]  azelastine (OPTIVAR) 0.05 % ophthalmic solution Place 1 drop into both eyes daily.  07/23/17  Yes [provider]  Calcium Glycerophosphate (PRELIEF PO) Take 2 tablets by mouth as needed (dysuria).    Yes [provider]  CALCIUM PO Take 1 tablet by mouth daily.   Yes [provider]  Cetirizine HCl (ZYRTEC ALLERGY PO) Take 10 mg by mouth daily.    Yes [provider]  Cholecalciferol (VITAMIN D PO) Take by mouth. 1000 units daily   Yes [provider]  dexamethasone (DECADRON) 2 MG tablet Take 1 tablet (2 mg total) by mouth 2 (two)  times daily. 04/10/18  Yes Hayden Georgeann Brinkman, PA-C  fluticasone [facility]) 50 MCG/ACT nasal spray Place 2 sprays into both nostrils daily.   Yes [provider]  LORazepam (ATIVAN) 0.5 MG tablet Take 1 tablet (0.5 mg total) by mouth at bedtime. Patient taking differently: Take 0.5 mg by mouth  daily as needed for anxiety.  03/26/18  Yes Causey, Charlestine Massed, NP  Multiple Vitamin (MULTIVITAMIN) tablet Take 1 tablet by mouth daily.   Yes [provider]  Naproxen Sod-diphenhydrAMINE (ALEVE PM PO) Take by mouth at bedtime.   Yes [provider]  ondansetron (ZOFRAN) 8 MG tablet Take 1 tablet (8 mg total) by mouth every 8 (eight) hours as needed for nausea or vomiting. 04/09/18  Yes Causey, Charlestine Massed, NP  OVER THE COUNTER MEDICATION Take 2 tablets by mouth as needed (excedrine - tension headache; for headache).    Yes [provider]  OVER THE COUNTER MEDICATION Take 2 tablets by mouth as needed (Excedrine - pain reliever; for joint pain associated with fibromyalgia (acetaminophen 250mg +Asprin 250mg +caffeine 65mg )).    Yes [provider]  oxyCODONE (OXY IR/ROXICODONE) 5 MG immediate release tablet Take 2 tablets (10 mg total) by mouth every 4 (four) hours as needed for severe pain. 04/17/18  Yes Hayden Nikholas Geffre, PA-C  Pseudoephedrine HCl (SUDAFED PO) Take 1 tablet by mouth as needed (psuedoephedrine HCl 60mg ).    Yes [provider]  Pseudoephedrine-guaiFENesin (MUCINEX D PO) Take 1 tablet by mouth as needed (guaifenesin 1200mg +pseudoephedrine HCl 120mg ).    Yes [provider]  Sennosides-Docusate Sodium (SENNA S PO) Take 2 tablets by mouth 2 (two) times daily. 2 tablets BID    Yes [provider]  dimenhyDRINATE (DRAMAMINE) 50 MG tablet Take 50 mg by mouth every 8 (eight) hours as needed for nausea.     [provider]  polyethylene glycol powder (MIRALAX) powder Please take 6 capfuls of MiraLAX in a 32 oz bottle of Gatorade over 2-4 hour period. The following day take 3 capfuls. On day 3 start taking 1 capful 3 times a day. Slowly cut back as needed until you have normal bowel movements. 04/24/18   Fatima Blank, MD                                                                                                                                     Past Surgical History Past Surgical History:  Procedure Laterality Date  . BREAST BIOPSY Left 05/14/2017   malignant  . BREAST LUMPECTOMY WITH AXILLARY LYMPH NODE BIOPSY Left 11/13/2017   Procedure: LEFT BREAST LUMPECTOMY WITH SENTINEL LYMPH NODE BIOPSY;  Surgeon: [name], MD;  Location: [facility];  Service: General;  Laterality: Left;  . CESAREAN SECTION     2  . ENDOMETRIAL ABLATION  2011  . PORTACATH PLACEMENT Right 05/31/2017   Procedure: INSERTION PORT-A-CATH WITH [place];  Surgeon: [name], MD;  Location: [facility];  Service: General;  Laterality: Right;   Family History Family History  Problem Relation Age of Onset  . Liver cancer Sister        "rare type"; deceased 60  . Breast cancer Maternal Aunt        Dx 18s; currently 12s  . Breast cancer Paternal Grandmother        Dx 55s; deceased 13s  . Cancer Paternal Aunt        unk. type; deceased 62  . Colon cancer Neg Hx   . Esophageal cancer Neg Hx   . Rectal cancer Neg Hx   . Stomach cancer Neg Hx     Social History Social History   Tobacco Use  . Smoking status: Former Research scientist (life sciences)  . Smokeless tobacco: Never Used  Substance Use Topics  . Alcohol use: Yes    Alcohol/week: 0.0 standard drinks    Comment: sometimes  . Drug use: Yes    Types: Marijuana    Comment: last smoked 11-10-17 ("for anxiety"), uses daily   Allergies Other  Review of Systems Review of Systems All other systems are reviewed and are negative for acute change except as noted in the HPI  Physical Exam Vital Signs  I have reviewed the triage vital signs BP 108/64   Pulse 99   Temp 98.8 F (37.1 C) (Oral)   Resp 17   SpO2 93%   Physical Exam  Constitutional: She is oriented to person, place, and time. She appears well-developed and well-nourished. No distress.  HENT:  Head: Normocephalic and atraumatic.  Right Ear: External ear normal.  Left  Ear: External ear normal.  Nose: Nose normal.  Eyes: Conjunctivae and EOM are normal. No scleral icterus.  Neck: Normal range of motion and phonation normal.  Cardiovascular: Normal rate and regular rhythm.  Pulmonary/Chest: Effort normal. No stridor. No respiratory distress.  Abdominal: She exhibits no distension. There is no tenderness. There is no rigidity, no rebound and no guarding. No hernia.  Musculoskeletal: Normal range of motion. She exhibits no edema.  Neurological: She is alert and oriented to person, place, and time.  Skin: She is not diaphoretic.  Psychiatric: She has a normal mood and affect. Her behavior is normal.  Vitals reviewed.   ED Results and Treatments Labs (all labs ordered are listed, but only abnormal results are displayed) Labs Reviewed  CBC WITH DIFFERENTIAL/PLATELET - Abnormal; Notable for the following components:      Result Value   RBC 3.23 (*)    Hemoglobin 9.7 (*)    HCT 30.1 (*)    Lymphs Abs 0.3 (*)    All other components within normal limits  COMPREHENSIVE METABOLIC PANEL - Abnormal; Notable for the following components:   Chloride 90 (*)    Calcium 8.4 (*)    Total Protein 6.3 (*)    Albumin 2.5 (*)    AST 65 (*)    All other components within normal limits  EKG  EKG Interpretation  Date/Time:    Ventricular Rate:    PR Interval:    QRS Duration:   QT Interval:    QTC Calculation:   R Axis:     Text Interpretation:        Radiology No results found. Pertinent labs & imaging results that were available during my care of the patient were reviewed by me and considered in my medical decision making (see chart for details).  Medications Ordered in ED Medications  mineral oil enema 1 enema (1 enema Rectal Given 04/24/18 0242)                                                                                                                                     Procedures Procedures  (including critical care time)  Medical Decision Making / ED Course I have reviewed the nursing notes for this encounter and the patient's prior records (if available in EHR or on provided paperwork).     On review of records, patient had a KUB performed 5 days ago revealing mild to moderate stool burden.  Screening labs obtain and did not show evidence of neutropenia.  A DRE was performed and notable for fecal impaction in the rectum.  Disimpaction was attempted.  Patient was given enema.  Unable to have a bowel movement.  Given the fact that she is inadequately treating her constipation, will attempt outpatient management.  Given specific MiraLAX regimen.  Patient has close follow-up with her oncologist and 3 to 5 days.  The patient appears reasonably screened and/or stabilized for discharge and I doubt any other medical condition or other [facility] requiring further screening, evaluation, or treatment in the ED at this time prior to discharge.  The patient is safe for discharge with strict return precautions.    Final Clinical Impression(s) / ED Diagnoses Final diagnoses:  Drug-induced constipation    Disposition: Discharge  Condition: Good  I have discussed the results, Dx and Tx plan with the patient who expressed understanding and agree(s) with the plan. Discharge instructions discussed at great length. The patient was given strict return precautions who verbalized understanding of the instructions. No further questions at time of discharge.    ED Discharge Orders         Ordered    polyethylene glycol powder (MIRALAX) powder     04/24/18 0343           Follow Up: [name], MD [address] [place] [address] [phone]  Schedule an appointment as soon as possible for a visit  As needed  Gardenia Phlegm, NP [place] [address] [phone]  On  04/28/2018 as scheduled     This chart was dictated using voice recognition software.  Despite best efforts to proofread,  errors can occur which can change the documentation meaning.   Fatima Blank, MD 04/24/18 [phone]

## 2018-04-25 ENCOUNTER — Ambulatory Visit: Payer: 59

## 2018-04-28 ENCOUNTER — Inpatient Hospital Stay: Payer: 59

## 2018-04-28 ENCOUNTER — Encounter: Payer: Self-pay | Admitting: Adult Health

## 2018-04-28 ENCOUNTER — Inpatient Hospital Stay (HOSPITAL_BASED_OUTPATIENT_CLINIC_OR_DEPARTMENT_OTHER): Payer: 59 | Admitting: Adult Health

## 2018-04-28 ENCOUNTER — Telehealth: Payer: Self-pay | Admitting: Oncology

## 2018-04-28 ENCOUNTER — Other Ambulatory Visit: Payer: Self-pay | Admitting: Adult Health

## 2018-04-28 VITALS — BP 98/65 | HR 109 | Temp 98.6°F | Resp 18 | Ht 68.75 in | Wt 152.8 lb

## 2018-04-28 VITALS — HR 100 | Resp 18

## 2018-04-28 DIAGNOSIS — R530 Neoplastic (malignant) related fatigue: Secondary | ICD-10-CM | POA: Diagnosis not present

## 2018-04-28 DIAGNOSIS — Z800 Family history of malignant neoplasm of digestive organs: Secondary | ICD-10-CM

## 2018-04-28 DIAGNOSIS — C7800 Secondary malignant neoplasm of unspecified lung: Secondary | ICD-10-CM

## 2018-04-28 DIAGNOSIS — C50412 Malignant neoplasm of upper-outer quadrant of left female breast: Secondary | ICD-10-CM | POA: Diagnosis not present

## 2018-04-28 DIAGNOSIS — C7931 Secondary malignant neoplasm of brain: Secondary | ICD-10-CM

## 2018-04-28 DIAGNOSIS — Z79899 Other long term (current) drug therapy: Secondary | ICD-10-CM

## 2018-04-28 DIAGNOSIS — Z9221 Personal history of antineoplastic chemotherapy: Secondary | ICD-10-CM

## 2018-04-28 DIAGNOSIS — G893 Neoplasm related pain (acute) (chronic): Secondary | ICD-10-CM

## 2018-04-28 DIAGNOSIS — K5903 Drug induced constipation: Secondary | ICD-10-CM

## 2018-04-28 DIAGNOSIS — Z171 Estrogen receptor negative status [ER-]: Principal | ICD-10-CM

## 2018-04-28 DIAGNOSIS — C7989 Secondary malignant neoplasm of other specified sites: Secondary | ICD-10-CM

## 2018-04-28 DIAGNOSIS — Z923 Personal history of irradiation: Secondary | ICD-10-CM

## 2018-04-28 DIAGNOSIS — C7951 Secondary malignant neoplasm of bone: Secondary | ICD-10-CM

## 2018-04-28 DIAGNOSIS — C787 Secondary malignant neoplasm of liver and intrahepatic bile duct: Secondary | ICD-10-CM

## 2018-04-28 DIAGNOSIS — N6310 Unspecified lump in the right breast, unspecified quadrant: Secondary | ICD-10-CM

## 2018-04-28 DIAGNOSIS — Z803 Family history of malignant neoplasm of breast: Secondary | ICD-10-CM

## 2018-04-28 DIAGNOSIS — Z87891 Personal history of nicotine dependence: Secondary | ICD-10-CM

## 2018-04-28 LAB — COMPREHENSIVE METABOLIC PANEL
ALBUMIN: 2.6 g/dL — AB (ref 3.5–5.0)
ALT: 12 U/L (ref 0–44)
ANION GAP: 15 (ref 5–15)
AST: 53 U/L — ABNORMAL HIGH (ref 15–41)
Alkaline Phosphatase: 84 U/L (ref 38–126)
BUN: 12 mg/dL (ref 6–20)
CO2: 28 mmol/L (ref 22–32)
Calcium: 8.7 mg/dL — ABNORMAL LOW (ref 8.9–10.3)
Chloride: 93 mmol/L — ABNORMAL LOW (ref 98–111)
Creatinine, Ser: 0.58 mg/dL (ref 0.44–1.00)
GFR calc Af Amer: >60 mL/min (ref >60)
GFR calc non Af Amer: >60 mL/min (ref >60)
GLUCOSE: 153 mg/dL — AB (ref 70–99)
POTASSIUM: 4 mmol/L (ref 3.5–5.1)
SODIUM: 136 mmol/L (ref 135–145)
TOTAL PROTEIN: 6.5 g/dL (ref 6.5–8.1)
Total Bilirubin: 0.7 mg/dL (ref 0.3–1.2)

## 2018-04-28 LAB — CBC WITH DIFFERENTIAL (CANCER CENTER ONLY)
ABS IMMATURE GRANULOCYTES: 0.1 10*3/uL — AB (ref 0.00–0.07)
BASOS ABS: 0 10*3/uL (ref 0.0–0.1)
BASOS PCT: 1 %
EOS ABS: 0 10*3/uL (ref 0.0–0.5)
Eosinophils Relative: 0 %
HCT: 31.4 % — ABNORMAL LOW (ref 36.0–46.0)
Hemoglobin: 10.2 g/dL — ABNORMAL LOW (ref 12.0–15.0)
IMMATURE GRANULOCYTES: 5 %
Lymphocytes Relative: 8 %
Lymphs Abs: 0.2 10*3/uL — ABNORMAL LOW (ref 0.7–4.0)
MCH: 29.8 pg (ref 26.0–34.0)
MCHC: 32.5 g/dL (ref 30.0–36.0)
MCV: 91.8 fL (ref 80.0–100.0)
Monocytes Absolute: 0.5 10*3/uL (ref 0.1–1.0)
Monocytes Relative: 23 %
NEUTROS ABS: 1.4 10*3/uL — AB (ref 1.7–7.7)
NEUTROS PCT: 63 %
NRBC: 3.2 % — AB (ref 0.0–0.2)
PLATELETS: 229 10*3/uL (ref 150–400)
RBC: 3.42 MIL/uL — ABNORMAL LOW (ref 3.87–5.11)
RDW: 14.3 % (ref 11.5–15.5)
WBC Count: 2.2 10*3/uL — ABNORMAL LOW (ref 4.0–10.5)

## 2018-04-28 MED ORDER — PROCHLORPERAZINE MALEATE 10 MG PO TABS
ORAL_TABLET | ORAL | Status: AC
  Filled 2018-04-28: qty 1

## 2018-04-28 MED ORDER — OXYCODONE HCL ER 30 MG PO T12A: 30.0000 mg | EXTENDED_RELEASE_TABLET | Freq: Two times a day (BID) | ORAL | 0 refills | Status: AC

## 2018-04-28 MED ORDER — SODIUM CHLORIDE 0.9 % IV SOLN
1.4000 mg/m2 | Freq: Once | INTRAVENOUS | Status: AC
  Administered 2018-04-28: 2.6 mg via INTRAVENOUS
  Filled 2018-04-28: qty 5.2

## 2018-04-28 MED ORDER — SODIUM CHLORIDE 0.9 % IV SOLN
Freq: Once | INTRAVENOUS | Status: AC
  Administered 2018-04-28: 11:00:00 via INTRAVENOUS
  Filled 2018-04-28: qty 250

## 2018-04-28 MED ORDER — SODIUM CHLORIDE 0.9% FLUSH
10.0000 mL | INTRAVENOUS | Status: DC | PRN
  Administered 2018-04-28: 10 mL
  Filled 2018-04-28: qty 10

## 2018-04-28 MED ORDER — LACTULOSE 10 GM/15ML PO SOLN: 10.0000 g | Freq: Two times a day (BID) | ORAL | 0 refills | Status: AC | PRN

## 2018-04-28 MED ORDER — PROCHLORPERAZINE MALEATE 10 MG PO TABS
10.0000 mg | ORAL_TABLET | Freq: Once | ORAL | Status: AC
  Administered 2018-04-28: 10 mg via ORAL

## 2018-04-28 MED ORDER — SODIUM CHLORIDE 0.9 % IV SOLN
INTRAVENOUS | Status: DC
  Administered 2018-04-28: 10:00:00 via INTRAVENOUS
  Filled 2018-04-28 (×2): qty 250

## 2018-04-28 MED ORDER — HEPARIN SOD (PORK) LOCK FLUSH 100 UNIT/ML IV SOLN
500.0000 [IU] | Freq: Once | INTRAVENOUS | Status: AC | PRN
  Administered 2018-04-28: 500 [IU]
  Filled 2018-04-28: qty 5

## 2018-04-28 NOTE — Progress Notes (Signed)
Per Mendel Ryder Causey-NP: OK to treat with ANC of 1.4

## 2018-04-28 NOTE — Telephone Encounter (Signed)
Gave patient appointments for October and November while in infusion area.

## 2018-04-28 NOTE — Progress Notes (Signed)
[name]  [phone] [phone]     ID: Amy Dawson DOB: [birth year]  MR#: [id number]  MVH#:[id number]  Patient Care Team: [name], MD as PCP - General (Family Medicine) [name], [name], MD as Consulting Physician (Oncology) [name], MD as Consulting Physician (General Surgery) [name], MD as Consulting Physician (Radiation Oncology) [name], NP as Nurse Practitioner (Obstetrics and Gynecology) OTHER MD:  CHIEF COMPLAINT: Triple negative breast cancer  CURRENT TREATMENT: to resume pembrolizumab, and start eribulin  HISTORY OF CURRENT ILLNESS: From the original intake note:  Amy Dawson felt some discomfort in the left breast in June 2018, but did not think much of it.  However in October she palpated a change in the upper outer quadrant of her left breast and she brought this to medical attention.  On May 09, 2017 she had bilateral diagnostic mammography with tomography at the breast center, showing the breast density to be category capital C.  In the left breast that was not obscured mass in the upper outer quadrant, which by palpation was identified at the 1230 o'clock radiant 5 cm from the nipple.  By ultrasound this measured 2.9 cm, and it was irregular and hypoechoic.  The left axilla was sonographically benign.  Biopsy of the left breast mass in question May 14, 2017 showed [phone]) and invasive ductal carcinoma, grade 2 or more likely 3, estrogen and progesterone receptor negative, with no HER-2 amplification, the signals ratio being 1.18 and the number per cell 1.65.  The key 67 was 80%.  The patient's subsequent history is as detailed below.  INTERVAL HISTORY: Seniya returns today for follow up and treatment of her triple negative and newly metastatic breast cancer.  She is starting chemotherapy with Eribulin given on days 1 and 8, and Pembrolizumab on day 1 of a 21 day cycle.  Today is cycle 2 day 8.     REVIEW OF SYSTEMS: Amy Dawson remains constipated.  She went to the ER for this.  She was able to have two bowel movements since her ER visit on 04/23/2018.  She was instructed to take 6 cap fulls of miralax mixed with gatorade followed by 3 the next day, and 3 the day after that.  She did this.  Amy Dawson says she had a difficult time drinking water.  She notes that the mag citrate was difficult to stomach.  She did subsequently have bowel movements.  Amy Dawson is in 8/10 pain in her rib cage and at the specific sites where her cutaneous metastases are located.  She is taking oxycodone 10 mg every four hours (two 49m tablets).  She is still in pain.  She says she is tolerating the chemotherapy well, but feels like her breast mass continues to enlarge.  She says she has help at home with her husband and her son.  Sometimes her mom will be able to help her.  She last took pain medication at 6 am.  She tolerated her chemotherapy well.  She noted no nausea and vomiting.  She is having no mouth sores or ulcerations.  She notes a decreased appetite.  She denies peripheral neuropathy.  She is getting up and around by herself.  She says she feels a slight palpitation when getting out of the shower, but otherwise denies cough, shortness of breath, chest pain/palpitations.    PAST MEDICAL HISTORY: Past Medical History:  Diagnosis Date  . Allergy 1993   Per patient 02/28/18: Diagnosed approximately 1993.  .Marland KitchenAnxiety 1995  Per patient 02/28/18: Diagnosed approximately 1995  . Breast cancer ([place])   . Chronic kidney disease 2019   bladder issues; Per patient 02/28/18, experiences burning with urination with certain food and beverages (caffeine, spicy foods, etc.).  Experienced for several years until on Keto diet, then symptoms reccured after stopping keto diet in Feb 2019  . Depression 2002   Per patient 02/28/18: diagnosed approximately 2002, does not have bipolar depression.  . Family history of breast cancer   .  Fibromyalgia 2002   Per patient 02/28/18: diagnosed approximately 2002  . Genetic testing 06/13/2017   Breast/GYN panel (23 genes) @ [facility] - No pathogenic mutations detected  . Headache 1993   smokes marijuana for migraine pain; Per patient 02/28/18:began in approximately 1993, experiences tension headaches and migraine headaches  . Murmur, cardiac 1978   as a child; Per patient 02/28/18: diagnosed approximately 1978  . Nausea 1978   Per patient 02/28/18: experiences occassional nausea since about age 52  . Personal history of chemotherapy 2018  . Stroke [facility]) 2000   numbness on left side; Per patient 02/28/18: partial numbness but retains full use of left side, is not limited  . TMJ (dislocation of temporomandibular joint) 1995   Per patient 02/28/18: diagnosed in approximately 1995    PAST SURGICAL HISTORY: Past Surgical History:  Procedure Laterality Date  . BREAST BIOPSY Left 05/14/2017   malignant  . BREAST LUMPECTOMY WITH AXILLARY LYMPH NODE BIOPSY Left 11/13/2017   Procedure: LEFT BREAST LUMPECTOMY WITH SENTINEL LYMPH NODE BIOPSY;  Surgeon: [name], MD;  Location: [place];  Service: General;  Laterality: Left;  . CESAREAN SECTION     2  . ENDOMETRIAL ABLATION  2011  . PORTACATH PLACEMENT Right 05/31/2017   Procedure: INSERTION PORT-A-CATH WITH [place];  Surgeon: [name], MD;  Location: [place];  Service: General;  Laterality: Right;    FAMILY HISTORY Family History  Problem Relation Age of Onset  . Liver cancer Sister        "rare type"; deceased 7  . Breast cancer Maternal Aunt        Dx 50s; currently 73s  . Breast cancer Paternal Grandmother        Dx 45s; deceased 24s  . Cancer Paternal Aunt        unk. type; deceased 15  . Colon cancer Neg Hx   . Esophageal cancer Neg Hx   . Rectal cancer Neg Hx   . Stomach cancer Neg Hx   The patient's parents are in their mid 85s as of November 2018.  The patient had 1  brother, 1 sister.  The sister died from a rare liver cancer at age 65.  A maternal aunt and a paternal grandmother were diagnosed with breast cancer in their 4s.  GYNECOLOGIC HISTORY:  No LMP recorded. Patient has had an ablation. Menarche age 59.  She underwent endometrial ablation 2011.  She is no longer having periods.  She never used hormone replacement.  First live birth was age 52.  She is GX P2.   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  Her husband [name] works as an Firefighter for [employer]  Daughter Njeri Vicente attends [employer].  Son Gwenivere Hiraldo is doing on apprenticeship in the [place] program.    ADVANCED DIRECTIVES: Not in place   HEALTH MAINTENANCE: Social History   Tobacco Use  . Smoking status: Former Research scientist (life sciences)  .  Smokeless tobacco: Never Used  Substance Use Topics  . Alcohol use: Yes    Alcohol/week: 0.0 standard drinks    Comment: sometimes  . Drug use: Yes    Types: Marijuana    Comment: last smoked 11-10-17 ("for anxiety"), uses daily     Colonoscopy: 2016/[facility]  PAP: October 2018  Bone density: Never   Allergies  Allergen Reactions  . Other Other (See Comments)    Red wine & white cheese: migraine Raw egg whites- hives    Current Outpatient Medications  Medication Sig Dispense Refill  . amitriptyline (ELAVIL) 50 MG tablet Take 25 mg by mouth at bedtime as needed for sleep. Patient taking differently; take 50 mg daily.    Marland Kitchen azelastine (OPTIVAR) 0.05 % ophthalmic solution Place 1 drop into both eyes daily.   1  . Calcium Glycerophosphate (PRELIEF PO) Take 2 tablets by mouth as needed (dysuria).     . CALCIUM PO Take 1 tablet by mouth daily.    . Cetirizine HCl (ZYRTEC ALLERGY PO) Take 10 mg by mouth daily.     . Cholecalciferol (VITAMIN D PO) Take by mouth. 1000 units daily    . dexamethasone (DECADRON) 2 MG tablet Take 1 tablet (2 mg total) by mouth 2 (two) times daily. 60 tablet 0  .  dimenhyDRINATE (DRAMAMINE) 50 MG tablet Take 50 mg by mouth every 8 (eight) hours as needed for nausea.     . fluticasone (FLONASE) 50 MCG/ACT nasal spray Place 2 sprays into both nostrils daily.    Marland Kitchen LORazepam (ATIVAN) 0.5 MG tablet Take 1 tablet (0.5 mg total) by mouth at bedtime. (Patient taking differently: Take 0.5 mg by mouth daily as needed for anxiety. ) 30 tablet 0  . Multiple Vitamin (MULTIVITAMIN) tablet Take 1 tablet by mouth daily.    . Naproxen Sod-diphenhydrAMINE (ALEVE PM PO) Take by mouth at bedtime.    . ondansetron (ZOFRAN) 8 MG tablet Take 1 tablet (8 mg total) by mouth every 8 (eight) hours as needed for nausea or vomiting. 30 tablet 1  . OVER THE COUNTER MEDICATION Take 2 tablets by mouth as needed (excedrine - tension headache; for headache).     . OVER THE COUNTER MEDICATION Take 2 tablets by mouth as needed (Excedrine - pain reliever; for joint pain associated with fibromyalgia (acetaminophen 227m+Asprin 2547mcaffeine 6576m.     . oMarland KitchenyCODONE (OXY IR/ROXICODONE) 5 MG immediate release tablet Take 2 tablets (10 mg total) by mouth every 4 (four) hours as needed for severe pain. 120 tablet 0  . polyethylene glycol powder (MIRALAX) powder Please take 6 capfuls of MiraLAX in a 32 oz bottle of Gatorade over 2-4 hour period. The following day take 3 capfuls. On day 3 start taking 1 capful 3 times a day. Slowly cut back as needed until you have normal bowel movements. 255 g 0  . Pseudoephedrine HCl (SUDAFED PO) Take 1 tablet by mouth as needed (psuedoephedrine HCl 80m12m    . Pseudoephedrine-guaiFENesin (MUCINEX D PO) Take 1 tablet by mouth as needed (guaifenesin 1200mg63mudoephedrine HCl 120mg)58m  . SennOrlie Dakinm (SENNA S PO) Take 2 tablets by mouth 2 (two) times daily. 2 tablets BID      No current facility-administered medications for this visit.     OBJECTIVE:   Vitals:   04/28/18 0829  BP: 98/65  Pulse: (!) 109  Resp: 18  Temp: 98.6 F (37 C)  SpO2:  100%     Body mass index is  22.73 kg/m.   Wt Readings from Last 3 Encounters:  04/28/18 152 lb 12.8 oz (69.3 kg)  04/21/18 155 lb 8 oz (70.5 kg)  04/18/18 154 lb 9.6 oz (70.1 kg)  ECOG FS:1 - Symptomatic but completely ambulatory  GENERAL: Patient is a well appearing female in no acute distress HEENT:  Sclerae anicteric.  Oropharynx clear and moist. No ulcerations or evidence of oropharyngeal candidiasis. Neck is supple.  NODES:  No cervical, supraclavicular, or axillary lymphadenopathy palpated.  BREAST EXAM:  Right sided breast tumor is again larger, skin remains intact LUNGS:  Clear to auscultation bilaterally.  No wheezes or rhonchi. HEART:  Regular rate and rhythm. No murmur appreciated. ABDOMEN:  Soft, nontender.  Positive, normoactive bowel sounds. No organomegaly palpated. MSK:  No focal spinal tenderness to palpation. Full range of motion bilaterally in the upper extremities. EXTREMITIES:  No peripheral edema.   SKIN:  Clear with no obvious rashes or skin changes. No nail dyscrasia. New subcutaneous masses on right anterior forearm, posterior neck and latteral upper posterior torso, also noted on anterior neck.  Stable areas on right posterior upper arm and on abdomen NEURO:  Nonfocal. Well oriented.  Appropriate affect.    LAB RESULTS:  CMP     Component Value Date/Time   NA 136 04/24/2018 0000   NA 138 07/10/2017 1458   K 3.9 04/24/2018 0000   K 3.7 07/10/2017 1458   CL 90 (L) 04/24/2018 0000   CO2 31 04/24/2018 0000   CO2 25 07/10/2017 1458   GLUCOSE 92 04/24/2018 0000   GLUCOSE 95 07/10/2017 1458   BUN 15 04/24/2018 0000   BUN 7.4 07/10/2017 1458   CREATININE 0.62 04/24/2018 0000   CREATININE 0.67 04/21/2018 0839   CREATININE 0.7 07/10/2017 1458   CALCIUM 8.4 (L) 04/24/2018 0000   CALCIUM 8.9 07/10/2017 1458   PROT 6.3 (L) 04/24/2018 0000   PROT 7.1 07/10/2017 1458   ALBUMIN 2.5 (L) 04/24/2018 0000   ALBUMIN 3.8 07/10/2017 1458   AST 65 (H) 04/24/2018 0000     AST 51 (H) 04/21/2018 0839   AST 10 07/10/2017 1458   ALT 13 04/24/2018 0000   ALT 11 04/21/2018 0839   ALT 11 07/10/2017 1458   ALKPHOS 96 04/24/2018 0000   ALKPHOS 92 07/10/2017 1458   BILITOT 1.2 04/24/2018 0000   BILITOT 0.5 04/21/2018 0839   BILITOT <0.22 07/10/2017 1458   GFRNONAA >60 04/24/2018 0000   GFRNONAA >60 04/21/2018 0839   GFRAA >60 04/24/2018 0000   GFRAA >60 04/21/2018 0839    No results found for: TOTALPROTELP, ALBUMINELP, A1GS, A2GS, BETS, BETA2SER, GAMS, MSPIKE, SPEI  No results found for: KPAFRELGTCHN, LAMBDASER, KAPLAMBRATIO  Lab Results  Component Value Date   WBC 4.3 04/24/2018   NEUTROABS 3.6 04/24/2018   HGB 9.7 (L) 04/24/2018   HCT 30.1 (L) 04/24/2018   MCV 93.2 04/24/2018   PLT 212 04/24/2018      Chemistry      Component Value Date/Time   NA 136 04/24/2018 0000   NA 138 07/10/2017 1458   K 3.9 04/24/2018 0000   K 3.7 07/10/2017 1458   CL 90 (L) 04/24/2018 0000   CO2 31 04/24/2018 0000   CO2 25 07/10/2017 1458   BUN 15 04/24/2018 0000   BUN 7.4 07/10/2017 1458   CREATININE 0.62 04/24/2018 0000   CREATININE 0.67 04/21/2018 0839   CREATININE 0.7 07/10/2017 1458      Component Value Date/Time   CALCIUM 8.4 (L) 04/24/2018  0000   CALCIUM 8.9 07/10/2017 1458   ALKPHOS 96 04/24/2018 0000   ALKPHOS 92 07/10/2017 1458   AST 65 (H) 04/24/2018 0000   AST 51 (H) 04/21/2018 0839   AST 10 07/10/2017 1458   ALT 13 04/24/2018 0000   ALT 11 04/21/2018 0839   ALT 11 07/10/2017 1458   BILITOT 1.2 04/24/2018 0000   BILITOT 0.5 04/21/2018 0839   BILITOT <0.22 07/10/2017 1458       No results found for: LABCA2  No components found for: KNLZJQ734  No results for input(s): INR in the last 168 hours.  No results found for: LABCA2  No results found for: LPF790  No results found for: WIO973  No results found for: ZHG992  No results found for: CA2729  No components found for: HGQUANT  No results found for: CEA1 / No results found  for: CEA1   No results found for: AFPTUMOR  No results found for: CHROMOGRNA  No results found for: PSA1  No visits with results within 3 Day(s) from this visit.  Latest known visit with results is:  Admission on 04/23/2018, Discharged on 04/24/2018  Component Date Value Ref Range Status  . WBC 04/24/2018 4.3  4.0 - 10.5 K/uL Final  . RBC 04/24/2018 3.23* 3.87 - 5.11 MIL/uL Final  . Hemoglobin 04/24/2018 9.7* 12.0 - 15.0 g/dL Final  . HCT 04/24/2018 30.1* 36.0 - 46.0 % Final  . MCV 04/24/2018 93.2  80.0 - 100.0 fL Final  . MCH 04/24/2018 30.0  26.0 - 34.0 pg Final  . MCHC 04/24/2018 32.2  30.0 - 36.0 g/dL Final  . RDW 04/24/2018 13.5  11.5 - 15.5 % Final  . Platelets 04/24/2018 212  150 - 400 K/uL Final  . nRBC 04/24/2018 0.0  0.0 - 0.2 % Final  . Neutrophils Relative % 04/24/2018 84  % Final  . Neutro Abs 04/24/2018 3.6  1.7 - 7.7 K/uL Final  . Lymphocytes Relative 04/24/2018 8  % Final  . Lymphs Abs 04/24/2018 0.3* 0.7 - 4.0 K/uL Final  . Monocytes Relative 04/24/2018 6  % Final  . Monocytes Absolute 04/24/2018 0.3  0.1 - 1.0 K/uL Final  . Eosinophils Relative 04/24/2018 1  % Final  . Eosinophils Absolute 04/24/2018 0.0  0.0 - 0.5 K/uL Final  . Basophils Relative 04/24/2018 0  % Final  . Basophils Absolute 04/24/2018 0.0  0.0 - 0.1 K/uL Final  . Immature Granulocytes 04/24/2018 1  % Final  . Abs Immature Granulocytes 04/24/2018 0.05  0.00 - 0.07 K/uL Final   Performed at [facility], [place] [address], [place], [place] [address]  . Sodium 04/24/2018 136  135 - 145 mmol/L Final  . Potassium 04/24/2018 3.9  3.5 - 5.1 mmol/L Final  . Chloride 04/24/2018 90* 98 - 111 mmol/L Final  . CO2 04/24/2018 31  22 - 32 mmol/L Final  . Glucose, Bld 04/24/2018 92  70 - 99 mg/dL Final  . BUN 04/24/2018 15  6 - 20 mg/dL Final  . Creatinine, Ser 04/24/2018 0.62  0.44 - 1.00 mg/dL Final  . Calcium 04/24/2018 8.4* 8.9 - 10.3 mg/dL Final  . Total Protein 04/24/2018 6.3* 6.5 -  8.1 g/dL Final  . Albumin 04/24/2018 2.5* 3.5 - 5.0 g/dL Final  . AST 04/24/2018 65* 15 - 41 U/L Final  . ALT 04/24/2018 13  0 - 44 U/L Final  . Alkaline Phosphatase 04/24/2018 96  38 - 126 U/L Final  . Total Bilirubin 04/24/2018  1.2  0.3 - 1.2 mg/dL Final  . GFR calc non Af Amer 04/24/2018 >60  >60 mL/min Final  . GFR calc Af Amer 04/24/2018 >60  >60 mL/min Final   Comment: (NOTE) The eGFR has been calculated using the CKD EPI equation. This calculation has not been validated in all clinical situations. eGFR's persistently <60 mL/min signify possible Chronic Kidney Disease.   Georgiann Hahn gap 04/24/2018 15  5 - 15 Final   Performed at [facility], [address] [place], [place] [address]    (this displays the last labs from the last 3 days)  No results found for: TOTALPROTELP, ALBUMINELP, A1GS, A2GS, BETS, BETA2SER, GAMS, MSPIKE, SPEI (this displays SPEP labs)  No results found for: KPAFRELGTCHN, LAMBDASER, KAPLAMBRATIO (kappa/lambda light chains)  No results found for: HGBA, HGBA2QUANT, HGBFQUANT, HGBSQUAN (Hemoglobinopathy evaluation)   No results found for: LDH  No results found for: IRON, TIBC, IRONPCTSAT (Iron and TIBC)  No results found for: FERRITIN  Urinalysis    Component Value Date/Time   COLORURINE YELLOW 07/07/2017 1400   APPEARANCEUR CLEAR 07/07/2017 1400   LABSPEC 1.003 (L) 07/07/2017 1400   LABSPEC 1.010 05/30/2017 1315   PHURINE 7.0 07/07/2017 1400   GLUCOSEU NEGATIVE 07/07/2017 1400   GLUCOSEU Negative 05/30/2017 1315   HGBUR NEGATIVE 07/07/2017 1400   BILIRUBINUR NEGATIVE 07/07/2017 1400   BILIRUBINUR Color Interference 05/30/2017 1315   KETONESUR NEGATIVE 07/07/2017 1400   PROTEINUR NEGATIVE 07/07/2017 1400   UROBILINOGEN Color Interference 05/30/2017 1315   NITRITE NEGATIVE 07/07/2017 1400   LEUKOCYTESUR NEGATIVE 07/07/2017 1400   LEUKOCYTESUR Color Interference 05/30/2017 1315     STUDIES:    ELIGIBLE FOR AVAILABLE  RESEARCH PROTOCOL: no  ASSESSMENT: 52 y.o. [place] status post left breast upper outer quadrant biopsy May 14, 2017 for a clinical T2 N0, stage IIB invasive ductal carcinoma, grade 3, triple negative, with an MIB-1 of 80%  (1) neoadjuvant chemotherapy consisting of doxorubicin and cyclophosphamide in dose dense fashion x4 started 06/04/2017, completed 07/17/2017; followed by weekly carboplatin and paclitaxel x12 started 07/30/2017, completed 10/22/2017  (a) echo on 05/30/2017: LVEF 55-60%  (2) Right lumpectomy and SLNB on 11/13/2017: showed ypT2 ypN0 residual invasive ductal carcinoma, grade 3, repeat prognostic panel again triple negative.  RCB-II  (3) adjuvant radiation with capecitabine sensitizaton  12/16/2017-01/31/2018, she did experience planter erythrodysesthesia with the Capecitabine, so it was not continued for six months after sensitization (total 6 weeks' treatment)   (4) genetics testing offered through [facility] 08/13/2016 showed: No deleterious mutations in ATM, BARD1, BRCA1, BRCA2, BRIP1, CDH1, CHEK2, DICER1, EPCAM*, MLH1, MSH2, MSH6, NBN, NF1, PALB2, PMS2, PTEN, RAD50, RAD51C, RAD51D, SMARCA4, STK11, TP53  (5) participated in [place], randomized to ARM 2= pembrolizumab Q 21 days, started 02/28/2018, received second dose 03/21/2018, after which she came off study with evidence of metastatic disease  METASTATIC DISEASE Sept 2019 (6) pelvic MRI suggests bone involvement as well as subcutaneous masses in the right buttock  (a) right breast and right axillary lymph node biopsy 03/28/2018 confirms recurrent triple negative breast cancer  (b) right buttock mass biopsy 03/25/2018,  positive for metastatic breast cancer, triple negative, with an MIB-1 of 80%  (c) CT scans of brain chest abdomen and pelvis, scan and brain MRI show involvement of liver, lungs, bones, and brain  (d) PD-L1 and Foundation One studies on 03/25/2018 obtained: See separately scanned results  (7) whole  brain irradiation started on 04/14/2018, to be completed 04/18/2018  (8) systemic chemotherapy and immunotherapy to  consist of eribulin days 1 and 8 of each 21-day cycle with pembrolizumab day 1 of each cycle, starting 04/21/2018  PLAN: Athelene tolerated her first cycle of Pembrolizumab/Eribulin well.  She will proceed with day 8 of eribulin only today so long as her labs are within parameters.  Amy Dawson and I reviewed three issues today listed below in numerical order.  1. Pain control:  She is using 6m of oxycodone in 24 hours.  I reviewed her pain regimen with Dr. MJana Hakim  She will be prescribed oxycontin 378mq12 hours to take on a scheduled basis, and will keep oxycodone 18m74mo take every 4 hours as needed.  I reviewed this with her in detail and gave her written instructions in her AVS on both the free text section, and in her medication list.    2. Constipation: She will continue with miralax daily, and Senokot S two tablets twice a day.  I suggested she pick up suppositories at the drug store to use if she needs them.  I also prescribed lactulose BID PRN to use if the miralax and senokot s are not working.  I gave her written instructions in her AVS about this.    3.  Weakness: I reviewed that she appears weaker today than prior.  She does have adequate help at home, but I am concerned about the potential for her continued decline.  She and I reviewed that I will place an in home palliative care referral today to come out and meet with her family.    Virga will return in one week for labs and f/u with Dr. MagJana HakimI reviewed the above with Dr. MagJana Hakim detail.  He is in agreement with the plan above.  She knows to call for any questions or concerns prior to her next appointment with us.[place]   A total of (50) minutes of face-to-face time was spent with this patient with greater than 50% of that time in counseling and care-coordination.    LinWilber BihariP 04/28/18 8:34 AM Medical  Oncology and Hematology [facility] [id number]Napan[address]C [id number]. [phone] Fax. [phone]

## 2018-04-28 NOTE — Patient Instructions (Signed)
[facility] Discharge Instructions for Patients Receiving Chemotherapy  Today you received the following chemotherapy agents [place]  To help prevent nausea and vomiting after your treatment, we encourage you to take your nausea medication as directed If you develop nausea and vomiting that is not controlled by your nausea medication, call the clinic.   BELOW ARE SYMPTOMS THAT SHOULD BE REPORTED IMMEDIATELY:  *FEVER GREATER THAN 100.5 F  *CHILLS WITH OR WITHOUT FEVER  NAUSEA AND VOMITING THAT IS NOT CONTROLLED WITH YOUR NAUSEA MEDICATION  *UNUSUAL SHORTNESS OF BREATH  *UNUSUAL BRUISING OR BLEEDING  TENDERNESS IN MOUTH AND THROAT WITH OR WITHOUT PRESENCE OF ULCERS  *URINARY PROBLEMS  *BOWEL PROBLEMS  UNUSUAL RASH Items with * indicate a potential emergency and should be followed up as soon as possible.  Feel free to call the clinic should you have any questions or concerns. The clinic phone number is [phone].  Please show the CHEMO ALERT CARD at check-in to the Emergency Department and triage nurse.   

## 2018-04-28 NOTE — Patient Instructions (Signed)
WE addressed three main things at your visit today.  Pain, Constipation, and Assistance at home.  1.  We are changing your pain regimen.  You will now take a long acting pain medication and a short acting pain medication.  The long acting pain medication is oxycontin/oxycodone extended release.  You will take this once every 12 hours.  You will continue to use your oxycodone immediate release 5mg  tablets if you need it (every 4 hours if needed)  for breakthrough pain.  2.  We reviewed the constipation.  Continue taking Miralax daily, Senokot S two tablets twice a day.  We will send in Lactulose to take twice a day if needed if the above do not work.  It would be a good idea to get suppositories from the drug store to also use if needed.    3.  Assistance at home.  We reviewed that since you are becoming weaker, we will consult palliative care to come out into the home to review how they can assist you and how we may better prepare for if you continue to decline.   Please call us immediately if you have any concerns.  We want to know if you go more than 2 days without a bowel movement, if your pain is uncontrolled, or for any questions/concerns whatsoever.        Oxycodone extended-release capsules What is this medicine? OXYCODONE (ox i KOE done) is a pain reliever. It is used to treat constant pain that lasts for more than a few days. This medicine may be used for other purposes; ask your health care provider or pharmacist if you have questions. COMMON BRAND NAME(S): XTAMPZA What should I tell my health care provider before I take this medicine? They need to know if you have any of these conditions: -Addison's disease -brain tumor -gallbladder disease -head injury -heart disease -history of a drug or alcohol abuse problem -if you often drink alcohol -kidney disease -liver disease -lung or breathing disease, like asthma -mental illness -pancreatic disease -seizures -stomach or  intestine problems -thyroid disease -an unusual or allergic reaction to oxycodone, codeine, hydrocodone, morphine, other medicines, foods, dyes, or preservatives -pregnant or trying to get pregnant -breast-feeding How should I use this medicine? Take this medicine by mouth with a full glass of water. Follow the directions on the prescription label. Take this medicine with food. You should always take it with the same amount of food each time. Take your medicine at regular intervals. Do not take it more often than directed. Do not stop taking except on your doctor's advice. A special MedGuide will be given to you by the pharmacist with each prescription and refill. Be sure to read this information carefully each time. Talk to your pediatrician regarding the use of this medicine in children. Special care may be needed. Overdosage: If you think you have taken too much of this medicine contact a poison control center or emergency room at once. NOTE: This medicine is only for you. Do not share this medicine with others. What if I miss a dose? If you miss a dose, take it as soon as you can. If it is almost time for your next dose, take only that dose. Do not take double or extra doses. What may interact with this medicine? This medicine may interact with the following medications: -alcohol -antihistamines for allergy, cough and cold -antiviral medicines for HIV or AIDS -atropine -certain antibiotics like clarithromycin, erythromycin, linezolid, rifampin -certain medicines for anxiety or  sleep -certain medicines for bladder problems like oxybutynin, tolterodine -certain medicines for depression like amitriptyline, fluoxetine, sertraline -certain medicines for fungal infections like ketoconazole, itraconazole, voriconazole -certain medicines for migraine headache like almotriptan, eletriptan, frovatriptan, naratriptan, rizatriptan, sumatriptan, zolmitriptan -certain medicines for nausea or vomiting  like dolasetron, ondansetron, palonosetron -certain medicines for Parkinson's disease like benztropine, trihexyphenidyl -certain medicines for seizures like phenobarbital, phenytoin, primidone -certain medicines for stomach problems like dicyclomine, hyoscyamine -certain medicines for travel sickness like scopolamine -diuretics -general anesthetics like halothane, isoflurane, methoxyflurane, propofol -ipratropium -local anesthetics like lidocaine, pramoxine, tetracaine -MAOIs like Carbex, Eldepryl, Marplan, Nardil, and Parnate -medicines that relax muscles for surgery -methylene blue -nilotinib -other narcotic medicines for pain or cough -phenothiazines like chlorpromazine, mesoridazine, prochlorperazine, thioridazine This list may not describe all possible interactions. Give your health care provider a list of all the medicines, herbs, non-prescription drugs, or dietary supplements you use. Also tell them if you smoke, drink alcohol, or use illegal drugs. Some items may interact with your medicine. What should I watch for while using this medicine? Tell your doctor or health care professional if your pain does not go away, if it gets worse, or if you have new or a different type of pain. You may develop tolerance to the medicine. Tolerance means that you will need a higher dose of the medication for pain relief. Tolerance is normal and is expected if you take this medicine for a long time. Do not suddenly stop taking your medicine because you may develop a severe reaction. Your body becomes used to the medicine. This does NOT mean you are addicted. Addiction is a behavior related to getting and using a drug for a non-medical reason. If you have pain, you have a medical reason to take pain medicine. Your doctor will tell you how much medicine to take. If your doctor wants you to stop the medicine, the dose will be slowly lowered over time to avoid any side effects. There are different types of  narcotic medicines (opiates). If you take more than one type at the same time or if you are taking another medicine that also causes drowsiness, you may have more side effects. Give your health care provider a list of all medicines you use. Your doctor will tell you how much medicine to take. Do not take more medicine than directed. Call emergency for help if you have problems breathing or unusual sleepiness. You may get drowsy or dizzy. Do not drive, use machinery, or do anything that needs mental alertness until you know how the medicine affects you. Do not stand or sit up quickly, especially if you are an older patient. This reduces the risk of dizzy or fainting spells. Alcohol may interfere with the effect of this medicine. Avoid alcoholic drinks. This medicine will cause constipation. Try to have a bowel movement at least every 2 to 3 days. If you do not have a bowel movement for 3 days, call your doctor or health care professional. Your mouth may get dry. Chewing sugarless gum or sucking on hard candy, and drinking plenty of water may help. Contact your doctor if the problem does not go away or is severe. What side effects may I notice from receiving this medicine? Side effects that you should report to your doctor or health care professional as soon as possible: -allergic reactions like skin rash, itching or hives, swelling of the face, lips, or tongue -breathing problems -confusion -signs and symptoms of low blood pressure like dizziness; feeling faint or  lightheaded, falls; unusually weak or tired -trouble passing urine or change in the amount of urine -trouble swallowing Side effects that usually do not require medical attention (report to your doctor or health care professional if they continue or are bothersome): -constipation -dry mouth -nausea, vomiting -tiredness This list may not describe all possible side effects. Call your doctor for medical advice about side effects. You may  report side effects to FDA at 1-800-FDA-1088. Where should I keep my medicine? Keep out of the reach of children. This medicine can be abused. Keep your medicine in a safe place to protect it from theft. Do not share this medicine with anyone. Selling or giving away this medicine is dangerous and against the law. Follow the directions in the [place]. Store at room temperature between 15 and 30 degrees C (59 and 86 degrees F). Protect from light. Keep container tightly closed. This medicine may cause accidental overdose and death if it is taken by other adults, children, or pets. Flush any unused medicine down the toilet to reduce the chance of harm. Do not use the medicine after the expiration date. NOTE: This sheet is a summary. It may not cover all possible information. If you have questions about this medicine, talk to your doctor, pharmacist, or health care provider.  2018 Elsevier/Gold Standard (2015-08-03 18:07:49)

## 2018-04-28 NOTE — Patient Instructions (Signed)
Implanted Port Home Guide An implanted port is a type of central line that is placed under the skin. Central lines are used to provide IV access when treatment or nutrition needs to be given through a person's veins. Implanted ports are used for long-term IV access. An implanted port may be placed because:  You need IV medicine that would be irritating to the small veins in your hands or arms.  You need long-term IV medicines, such as antibiotics.  You need IV nutrition for a long period.  You need frequent blood draws for lab tests.  You need dialysis.  Implanted ports are usually placed in the chest area, but they can also be placed in the upper arm, the abdomen, or the leg. An implanted port has two main parts:  Reservoir. The reservoir is round and will appear as a small, raised area under your skin. The reservoir is the part where a needle is inserted to give medicines or draw blood.  Catheter. The catheter is a thin, flexible tube that extends from the reservoir. The catheter is placed into a large vein. Medicine that is inserted into the reservoir goes into the catheter and then into the vein.  How will I care for my incision site? Do not get the incision site wet. Bathe or shower as directed by your health care provider. How is my port accessed? Special steps must be taken to access the port:  Before the port is accessed, a numbing cream can be placed on the skin. This helps numb the skin over the port site.  Your health care provider uses a sterile technique to access the port. ? Your health care provider must put on a mask and sterile gloves. ? The skin over your port is cleaned carefully with an antiseptic and allowed to dry. ? The port is gently pinched between sterile gloves, and a needle is inserted into the port.  Only "non-coring" port needles should be used to access the port. Once the port is accessed, a blood return should be checked. This helps ensure that the port  is in the vein and is not clogged.  If your port needs to remain accessed for a constant infusion, a clear (transparent) bandage will be placed over the needle site. The bandage and needle will need to be changed every week, or as directed by your health care provider.  Keep the bandage covering the needle clean and dry. Do not get it wet. Follow your health care provider's instructions on how to take a shower or bath while the port is accessed.  If your port does not need to stay accessed, no bandage is needed over the port.  What is flushing? Flushing helps keep the port from getting clogged. Follow your health care provider's instructions on how and when to flush the port. Ports are usually flushed with saline solution or a medicine called heparin. The need for flushing will depend on how the port is used.  If the port is used for intermittent medicines or blood draws, the port will need to be flushed: ? After medicines have been given. ? After blood has been drawn. ? As part of routine maintenance.  If a constant infusion is running, the port may not need to be flushed.  How long will my port stay implanted? The port can stay in for as long as your health care provider thinks it is needed. When it is time for the port to come out, surgery will be   done to remove it. The procedure is similar to the one performed when the port was put in. When should I seek immediate medical care? When you have an implanted port, you should seek immediate medical care if:  You notice a bad smell coming from the incision site.  You have swelling, redness, or drainage at the incision site.  You have more swelling or pain at the port site or the surrounding area.  You have a fever that is not controlled with medicine.  This information is not intended to replace advice given to you by your health care provider. Make sure you discuss any questions you have with your health care provider. Document  Released: 07/02/2005 Document Revised: 12/08/2015 Document Reviewed: 03/09/2013 Elsevier Interactive Patient Education  2017 Elsevier Inc.  

## 2018-04-29 NOTE — Progress Notes (Signed)
FMLA for spouse, Jaila Schellhorn, completed and mailed to patient address on file. No fax number provided to fax forms to.

## 2018-04-30 ENCOUNTER — Telehealth: Payer: Self-pay

## 2018-04-30 NOTE — Telephone Encounter (Signed)
Phone call placed to patient to introduce Palliative Care. VM left with call back information,

## 2018-05-01 ENCOUNTER — Telehealth: Payer: Self-pay | Admitting: *Deleted

## 2018-05-01 NOTE — Telephone Encounter (Signed)
This RN spoke with pt's husband per his call stating concern due to " Amy Dawson seems to be rapidly deteriorating".  Per phone discussion [name] states pt has not been drinking or eating for 2 days. She is fairly bedridden and has " a wet sound when she breathes " " her skin is cold "  [name] states Amy Dawson is " having the tumors ( sub cue ) pop up all over her back "  Per discussion [name] was wanting to verify that " I think she is in the last stages ".  This RN discussed above- including inquiry regarding if [name] or pt would like admission to the hospital for further support or more aggressive care.  [name] stated " no - I just wanted to verify that she is in the last stages "  This RN verified that per his description including -  No eating or drinking for 2 days Now primarily sleeping and bed ridden Breathing with " wet " sounds  Skin cold and clammy  This RN informed [name] above are symptoms of end stage of life.  This RN gave validation to [facility] regarding pt's rapid decline - which at this point goal may be to offer support care to pt as well as to [place] and their children.  This RN inquired about referral to Hospice - with [name] stating " I don't know - Amy Dawson doesn't even seem to want me to help her "  This RN informed [facility] can assist him with care- especially since pt seems to be rapidly declining with possible death over the next few days.  [name] is agreeable to the above.  This RN per MD review contacted [facility] - information given and records faxed to [phone].

## 2018-05-02 ENCOUNTER — Other Ambulatory Visit: Payer: 59

## 2018-05-02 ENCOUNTER — Ambulatory Visit: Payer: 59

## 2018-05-05 ENCOUNTER — Inpatient Hospital Stay: Payer: 59

## 2018-05-05 ENCOUNTER — Inpatient Hospital Stay: Payer: 59 | Admitting: Oncology

## 2018-05-05 ENCOUNTER — Telehealth: Payer: Self-pay | Admitting: Primary Care

## 2018-05-05 NOTE — Progress Notes (Signed)
We have been working closely with Amy Dawson's husband.  She appeared moribund by his description late last week and we called hospice in.  Today she is sitting up a little, although still very confused and sleepy.  At this point we do not expect that she will return here for treatment but they understand that if she does get better that is exactly what we would want to do  Clearly she should not be resuscitated in case of a terminal event.

## 2018-05-05 NOTE — Telephone Encounter (Signed)
Patient was admitted to hospice 05/01/18. Palliative referral discontinued.

## 2018-05-06 ENCOUNTER — Telehealth: Payer: Self-pay | Admitting: Oncology

## 2018-05-06 NOTE — Telephone Encounter (Signed)
pts appts canceled per 10/21 sch message.

## 2018-05-09 NOTE — Progress Notes (Signed)
FMLA for son, Amy Dawson, successfully faxed to [employer] at [phone]. Mailed copy to patient address on file.

## 2018-05-12 ENCOUNTER — Ambulatory Visit: Payer: 59

## 2018-05-12 ENCOUNTER — Ambulatory Visit: Payer: 59 | Admitting: Adult Health

## 2018-05-12 ENCOUNTER — Other Ambulatory Visit: Payer: 59

## 2018-05-15 ENCOUNTER — Encounter: Payer: Self-pay | Admitting: *Deleted

## 2018-05-15 ENCOUNTER — Encounter: Payer: Self-pay | Admitting: Radiation Oncology

## 2018-05-15 NOTE — Progress Notes (Signed)
  Radiation Oncology         [phone] ________________________________  Name: Amy Dawson MRN: [id number]  Date: 05/15/2018  DOB: [birth year]  End of Treatment Note  Diagnosis:   Stage IV left breastcancer  Indication for treatment::  palliative       Radiation treatment dates:   04/07/2018 - 04/18/2018  Site/dose:    1. The right femur was treated to 30 Gy in 10 fractions using multiple energy photons with a 3D technique. 2. The brain was treated to 30 Gy in 10 fractions using 6x photons with a 3D technique  3. The chest was treated to 30 Gy in 10 fractions using multiple energy photons with a 3D technique. 4. The right breast was treated to 20 Gy in 5 fractions using multiple energy photons with a 3D technique   Narrative: The patient tolerated radiation treatment relatively well. She reported pain to her back, shoulder, stomach, and in her left ear. She also experienced severe fatigue, shortness of breath with activity and at rest, lack of appetite, difficulty recalling names and places, and headaches sometimes. She denied any difficulty with swallowing, numbness or tingling in her extremities, fine motor changes, and swelling in her extremities.  Plan: The patient has completed radiation treatment. The patient will return to radiation oncology clinic for routine followup in one month. I advised the patient to call or return sooner if they have any questions or concerns related to their recovery or treatment. ________________________________  Jodelle Gross, M.D., Ph.D.   This document serves as a record of services personally performed by [name], MD. It was created on his behalf by Wilburn Mylar, a trained medical scribe. The creation of this record is based on the scribe's personal observations and the provider's statements to them. This document has been checked and approved by the attending provider.

## 2018-05-16 DEATH — deceased

## 2018-05-19 ENCOUNTER — Encounter: Payer: Self-pay | Admitting: *Deleted

## 2018-05-19 ENCOUNTER — Ambulatory Visit: Payer: 59

## 2018-05-19 ENCOUNTER — Other Ambulatory Visit: Payer: 59

## 2018-05-19 ENCOUNTER — Inpatient Hospital Stay: Admission: RE | Admit: 2018-05-19 | Payer: Self-pay | Source: Ambulatory Visit | Admitting: Radiation Oncology

## 2018-05-19 ENCOUNTER — Ambulatory Visit: Payer: 59 | Admitting: Adult Health

## 2018-05-19 NOTE — Progress Notes (Signed)
05/19/18 at 1:20pm-  [id number] study notes- The research nurse found the pt's obituary on the [employer]  The pt died on 05/22/18.  Dr. [name] referred the pt to Hospice, and the pt's death was expected due to widespread disease progression.  The research nurse notified Dr. [name] nurse about the pt's death.  The pt was due for her month 12 visit on-study in mid November.  The research nurse contacted Marjean Donna, data manager, at [facility] about the pt's death.  He stated that the research nurse does not need to complete any SAE reporting since this was a death due to disease progression and not related to the pt's participation in the study.  He advised the site to complete the Pt Status Form and send it to [place].  The research nurse completed the form today and scanned it to [facility].    Brion Aliment RN, BSN, CCRP Clinical Research Nurse 05/19/2018 1:27 PM

## 2018-05-20 ENCOUNTER — Telehealth: Payer: Self-pay | Admitting: *Deleted

## 2018-05-23 ENCOUNTER — Other Ambulatory Visit: Payer: 59

## 2018-05-23 ENCOUNTER — Ambulatory Visit: Payer: 59

## 2018-05-26 ENCOUNTER — Other Ambulatory Visit: Payer: 59

## 2018-05-26 ENCOUNTER — Ambulatory Visit: Payer: 59 | Admitting: Oncology

## 2018-06-07 DIAGNOSIS — C7981 Secondary malignant neoplasm of breast: Secondary | ICD-10-CM | POA: Insufficient documentation

## 2018-06-07 NOTE — Progress Notes (Signed)
  Radiation Oncology         [phone] ________________________________  Name: Amy Dawson MRN: [id number]  Date: 04/02/2018  DOB: May 22, 1966  SIMULATION AND TREATMENT PLANNING NOTE  DIAGNOSIS:     ICD-10-CM   1. Brain metastases ([place]) C79.31   2. Malignant neoplasm metastatic to lung, unspecified laterality ([place]) C78.00   3. Bone metastases ([place]) C79.51      Site:   1.  Right femur 2.  Whole brain 3.  Chest  NARRATIVE:  The patient was brought to the [place].  Identity was confirmed.  All relevant records and images related to the planned course of therapy were reviewed.   Written consent to proceed with treatment was confirmed which was freely given after reviewing the details related to the planned course of therapy had been reviewed with the patient.  Then, the patient was set-up in a stable reproducible  supine position for radiation therapy.  CT images were obtained.  Surface markings were placed.    Medically necessary complex treatment device(s) for immobilization:   1.  Customized thermoplastic head cast 2.  Vac-Lok bag 3.  Accu form device.   The CT images were loaded into the planning software.  Then the target and avoidance structures were contoured.  Treatment planning then occurred.  The radiation prescription was entered and confirmed.  A total of 11 complex treatment devices were fabricated which relate to the designed radiation treatment fields. Each of these customized fields/ complex treatment devices will be used on a daily basis during the radiation course. I have requested : 3D Simulation  I have requested a DVH of the following structures: Target volume, lungs, spinal cord.  The patient will undergo daily image guidance to ensure accurate localization of the target, and adequate minimize dose to the normal surrounding structures in close proximity to the target. Marland Kitchen   PLAN:  The patient will receive 30 Gy in 10  fractions.  ________________________________   Jodelle Gross, MD, PhD

## 2018-06-07 NOTE — Progress Notes (Signed)
  Radiation Oncology         [phone] ________________________________  Name: Amy Dawson MRN: [id number]  Date: 04/11/2018  DOB: Aug 16, 1965  SIMULATION AND TREATMENT PLANNING NOTE  DIAGNOSIS:     ICD-10-CM   1. Metastasis to breast [facility]) C79.81      Site: Right breast  NARRATIVE:  The patient was brought to the [place].  Identity was confirmed.  All relevant records and images related to the planned course of therapy were reviewed.   Written consent to proceed with treatment was confirmed which was freely given after reviewing the details related to the planned course of therapy had been reviewed with the patient.  Then, the patient was set-up in a stable reproducible  supine position for radiation therapy.  CT images were obtained.  Surface markings were placed.    Medically necessary complex treatment device(s) for immobilization: Vac-Lok bag.   The CT images were loaded into the planning software.  Then the target and avoidance structures were contoured.  Treatment planning then occurred.  The radiation prescription was entered and confirmed.  A total of 4 complex treatment devices were fabricated which relate to the designed radiation treatment fields. Each of these customized fields/ complex treatment devices will be used on a daily basis during the radiation course. I have requested : Isodose Plan.   PLAN:  The patient will receive 20 Gy in 5 fractions.  ________________________________   Jodelle Gross, MD, PhD

## 2018-06-07 NOTE — Addendum Note (Signed)
Encounter addended by: [name], MD on: 06/07/2018 4:14 PM  Actions taken: Problem List modified, Visit diagnoses modified, Sign clinical note

## 2018-06-13 ENCOUNTER — Ambulatory Visit: Payer: 59

## 2018-06-13 ENCOUNTER — Other Ambulatory Visit: Payer: 59

## 2018-07-04 ENCOUNTER — Other Ambulatory Visit: Payer: 59

## 2018-07-04 ENCOUNTER — Ambulatory Visit: Payer: 59

## 2019-08-05 NOTE — Telephone Encounter (Signed)
No entry 

## 2019-08-29 IMAGING — MG DIGITAL DIAGNOSTIC UNILATERAL RIGHT MAMMOGRAM WITH TOMO AND CAD
6 series · 6 of 18 positions shown · non-contrast
Comparison: Previous exam(s).

CLINICAL DATA: Patient presents for diagnostic right breast
examination due to a palpable abnormality over the past 2 weeks in
the slightly inner upper breast. History of triple negative left
breast cancer diagnosed May 2017 post neoadjuvant chemotherapy
and subsequent lumpectomy November 2017. Known biopsy-proven metastatic
disease to bone and subcutaneous fat as well as known pulmonary
metastases.

EXAM:
DIGITAL DIAGNOSTIC right MAMMOGRAM WITH CAD AND TOMO
ULTRASOUND right BREAST

[R MLO synth-2D (1 of 2)]
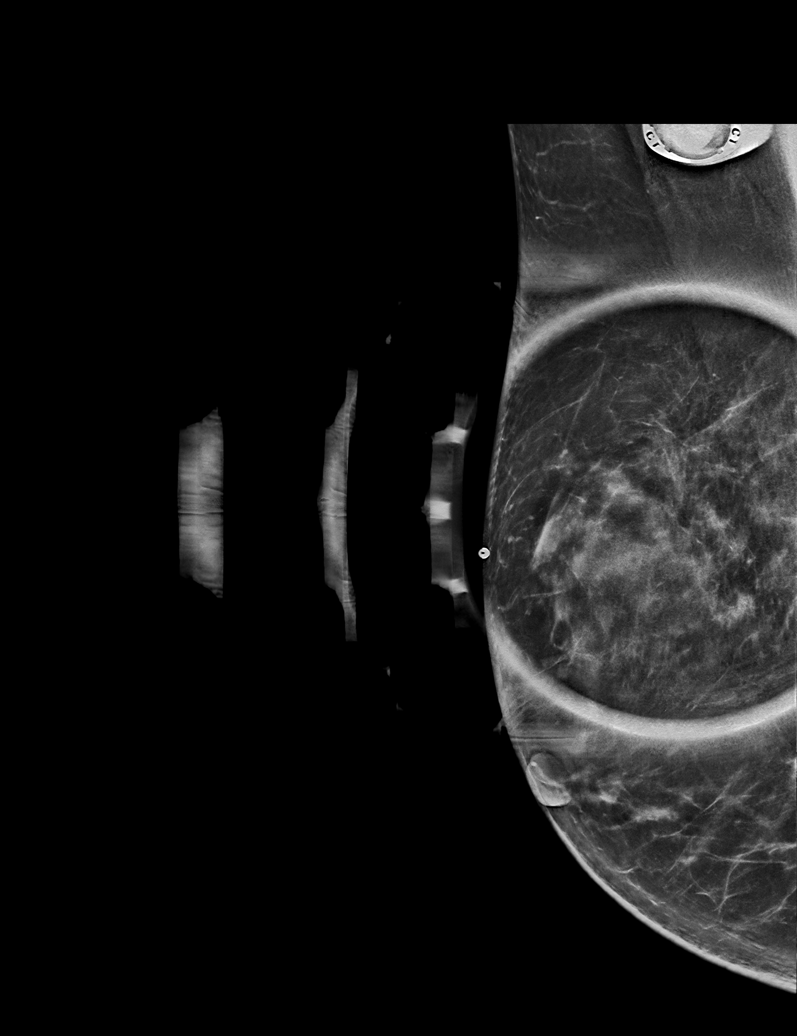

[R CC synth-2D]
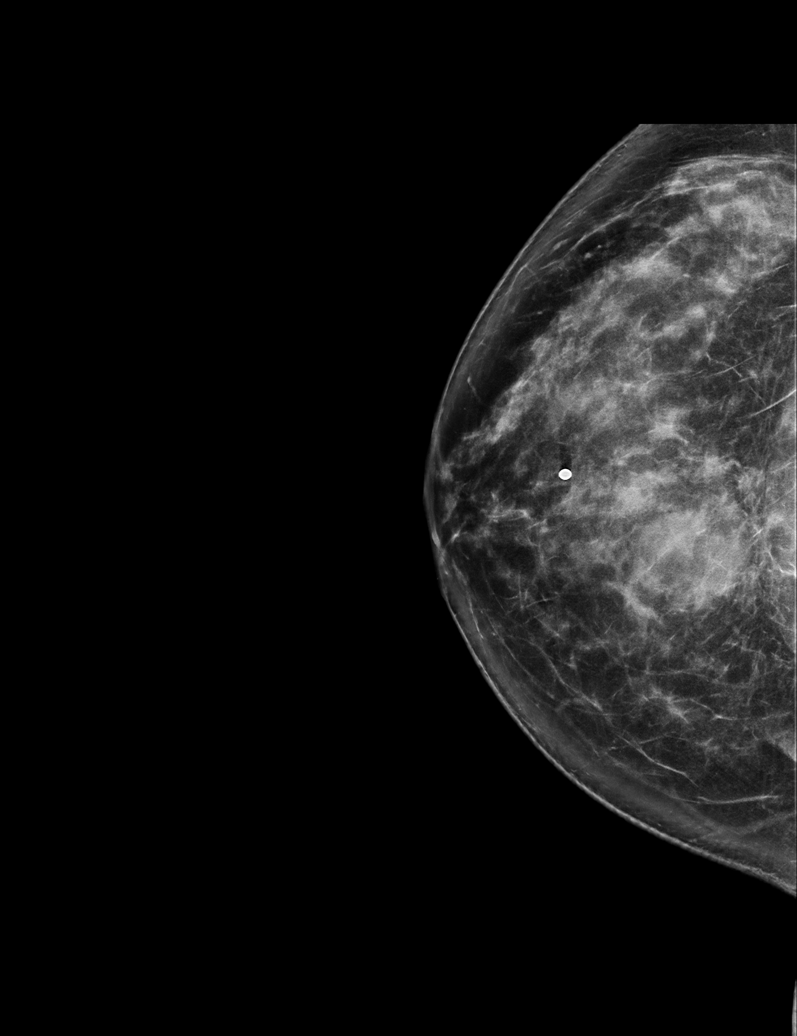

[R MLO synth-2D (2 of 2)]
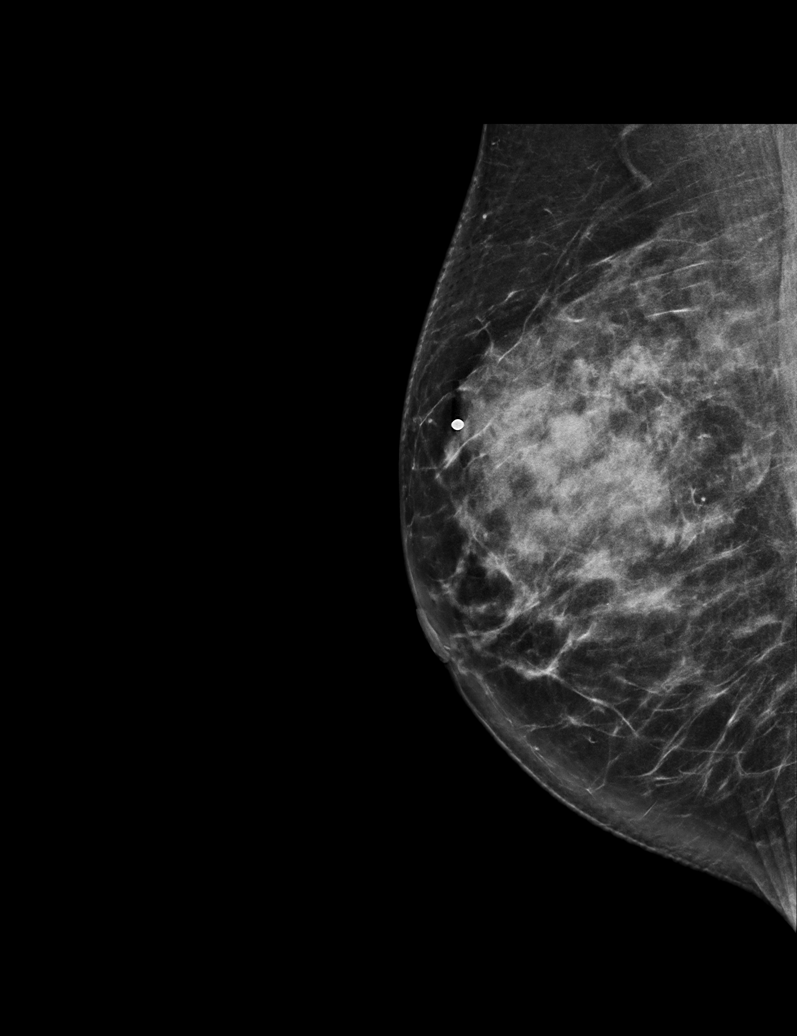

[R MLO tomo (1 of 2) · tomo slice 37/73.0]
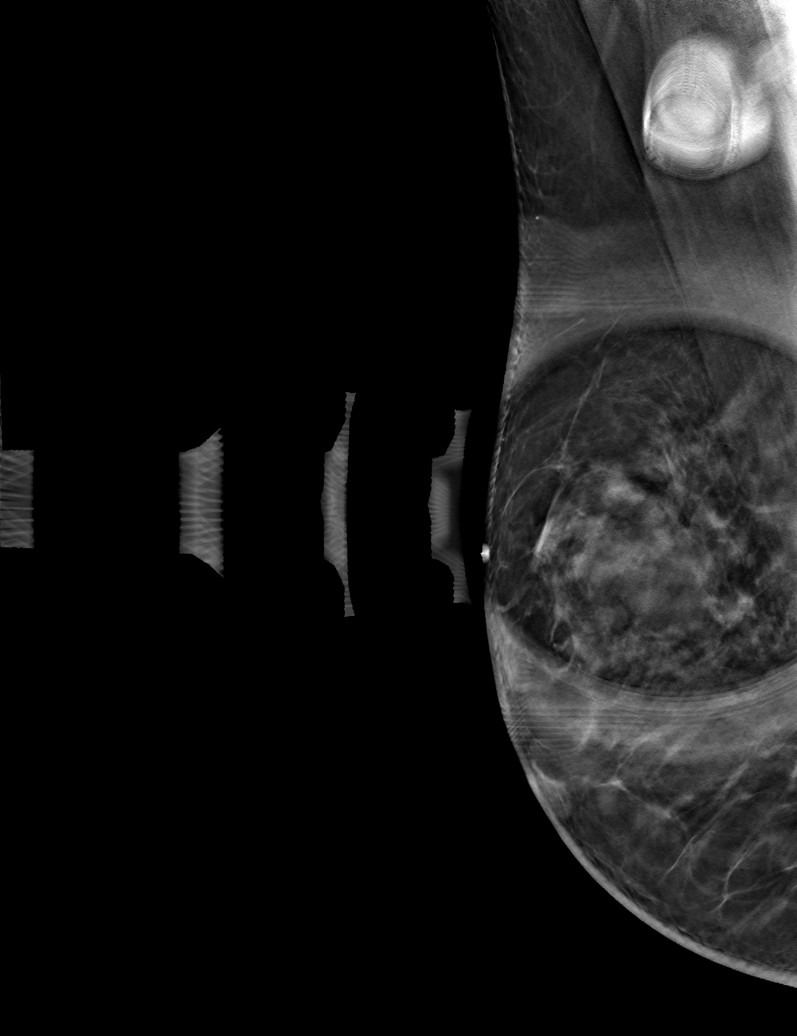

[R MLO tomo (2 of 2) · tomo slice 39/76.0]
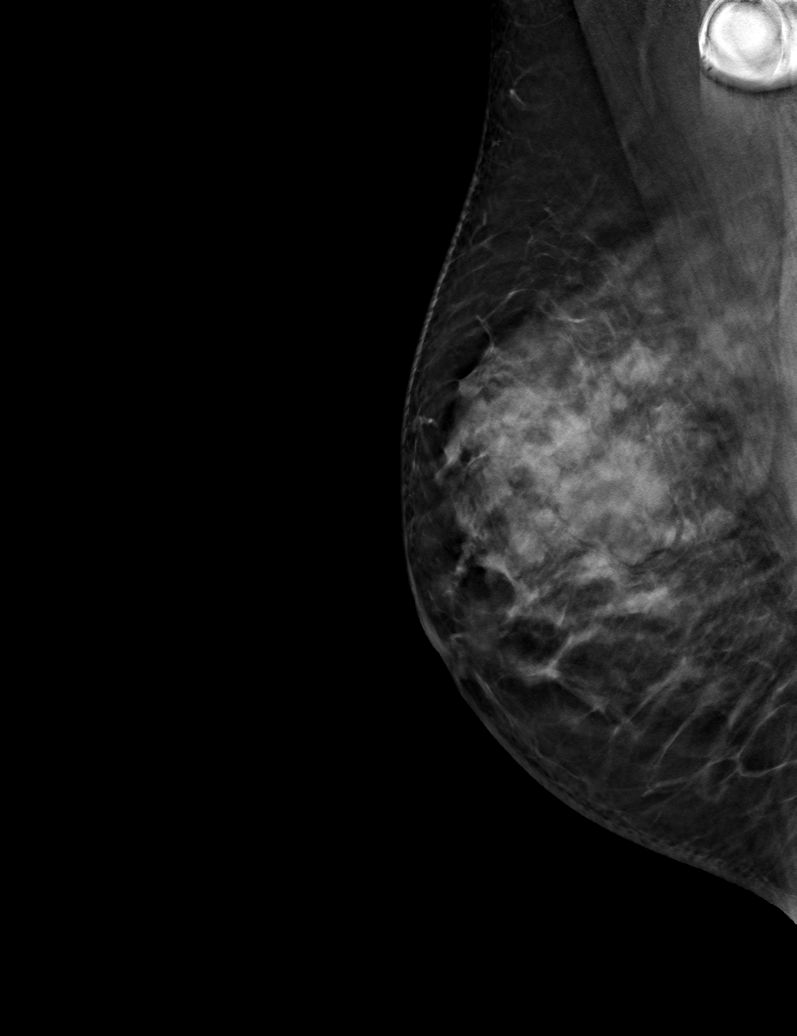

[R CC tomo · tomo slice 37/74.0]
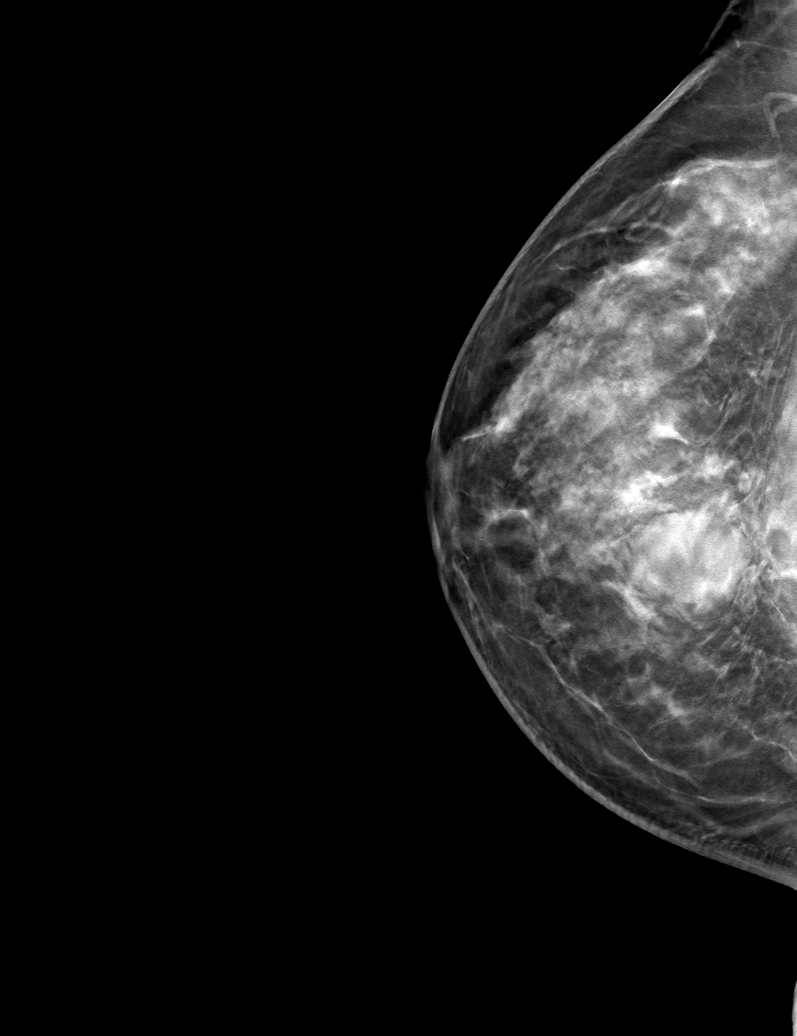

[6 of 18 positions shown; findings below may reference images not displayed]

ACR Breast Density Category c: The breast tissue is heterogeneously
dense, which may obscure small masses.
FINDINGS: Examination demonstrates a lobulated mass with partially
circumscribed and partially obscured borders over the inner upper
right breast corresponding to patient's palpable abnormality.
Remainder the exam is unchanged.

Mammographic images were processed with CAD.

On physical exam, I palpate a firm fixed 4 x 4 cm mass at the
12:30-1 o'clock position of the right breast approximately 4-5 cm
from the nipple.

Targeted ultrasound is performed, showing a large irregular shaped
heterogeneous mass with mixed circumscribed, irregular and
indistinct borders at the 12:30-1 o'clock position of the right
breast 5 cm from the nipple measuring approximately 1.6 x 3.2 x
cm.

Ultrasound the right axilla demonstrates several normal lymph nodes
as well as a heterogeneous mass within the subcutaneous fat over the
high right axilla towards the undersurface of the arm measuring
x 1.5 x 1.6 cm likely a metastatic focus.
IMPRESSION: Suspicious mass over the 12:30-1 o'clock position of the right
breast 5 cm from the nipple measuring 1.6 x 3.2 x 4.1 cm likely
malignant.

Suspicious mass over the high right axilla measuring 1.6 cm in
greatest diameter likely metastatic disease.

RECOMMENDATION:
Recommend ultrasound-guided core needle biopsy of the right breast
mass and high right axillary mass as described.

I have discussed the findings and recommendations with the patient.
Results were also provided in writing at the conclusion of the
visit. If applicable, a reminder letter will be sent to the patient
regarding the next appointment.

BI-RADS CATEGORY  5: Highly suggestive of malignancy.

Biopsy scheduled for tomorrow at [DATE] a.m. here at the [REDACTED].

## 2019-08-29 IMAGING — US ULTRASOUND RIGHT BREAST LIMITED
1 series · 13 of 19 positions shown · non-contrast
Comparison: Previous exam(s).

CLINICAL DATA: Patient presents for diagnostic right breast
examination due to a palpable abnormality over the past 2 weeks in
the slightly inner upper breast. History of triple negative left
breast cancer diagnosed May 2017 post neoadjuvant chemotherapy
and subsequent lumpectomy November 2017. Known biopsy-proven metastatic
disease to bone and subcutaneous fat as well as known pulmonary
metastases.

EXAM:
DIGITAL DIAGNOSTIC right MAMMOGRAM WITH CAD AND TOMO
ULTRASOUND right BREAST

[Series 1: ultrasound right breast limited · 0.07mm/px · 13 of 19 slices shown]
[im 1/19]
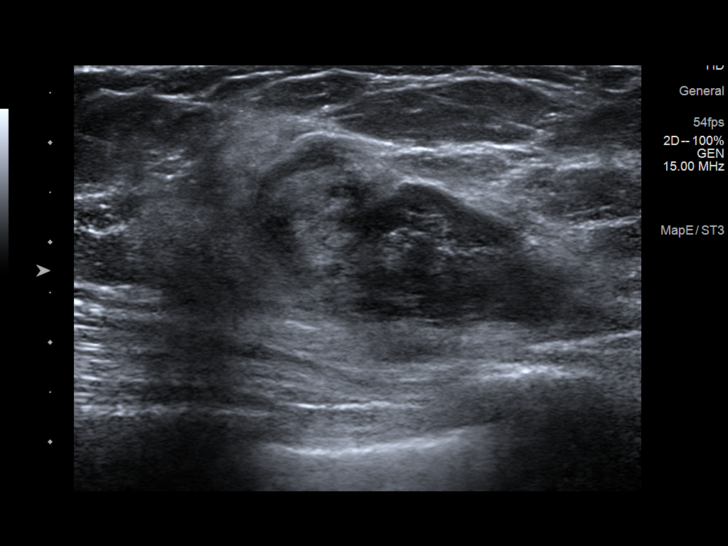
[im 3/19]
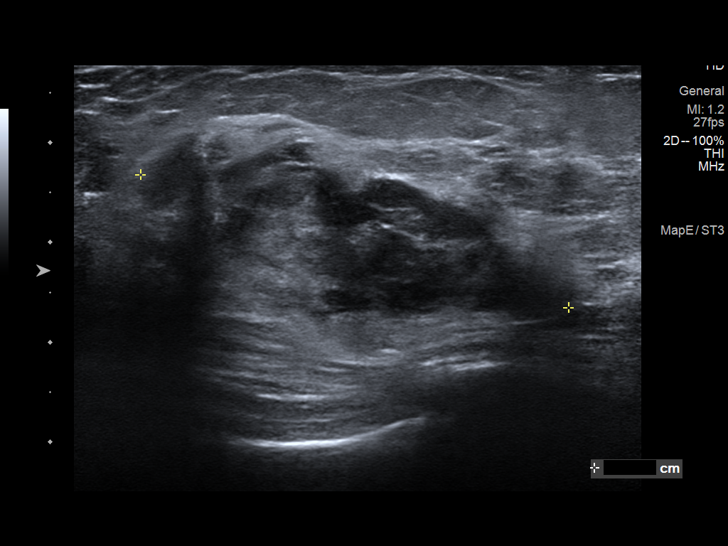
[im 4/19]
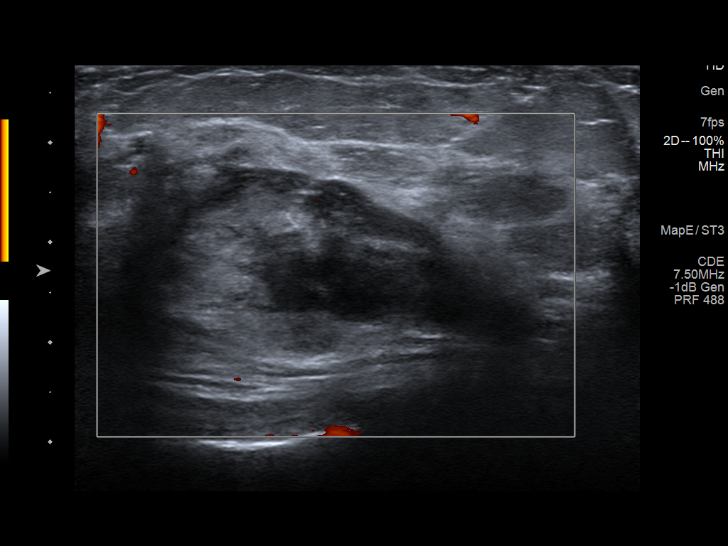
[im 6/19]
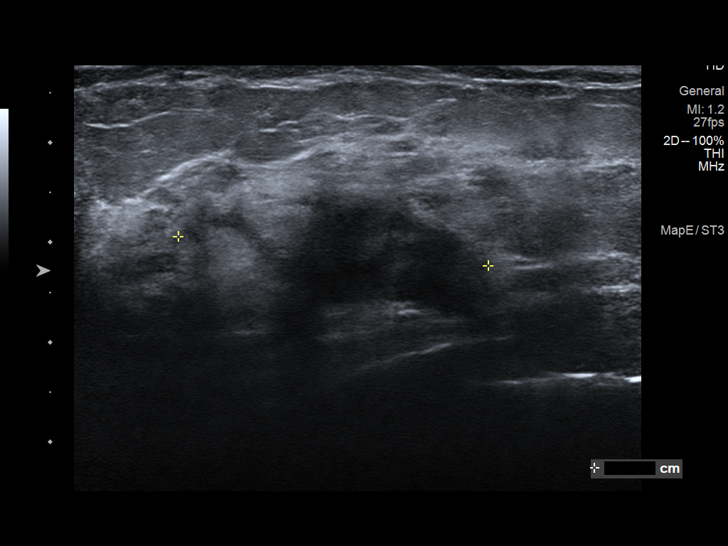
[im 7/19]
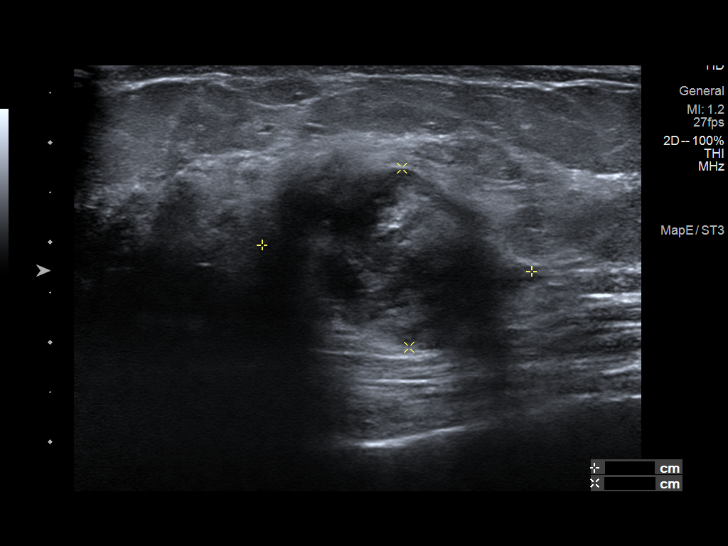
[im 9/19]
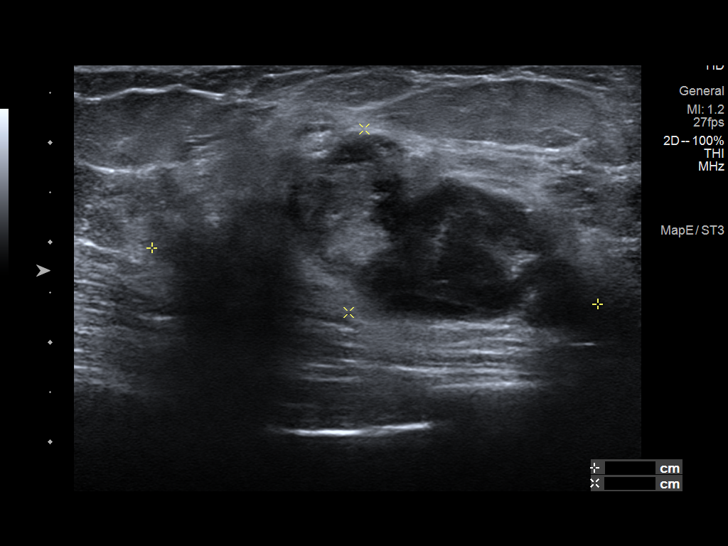
[im 10/19]
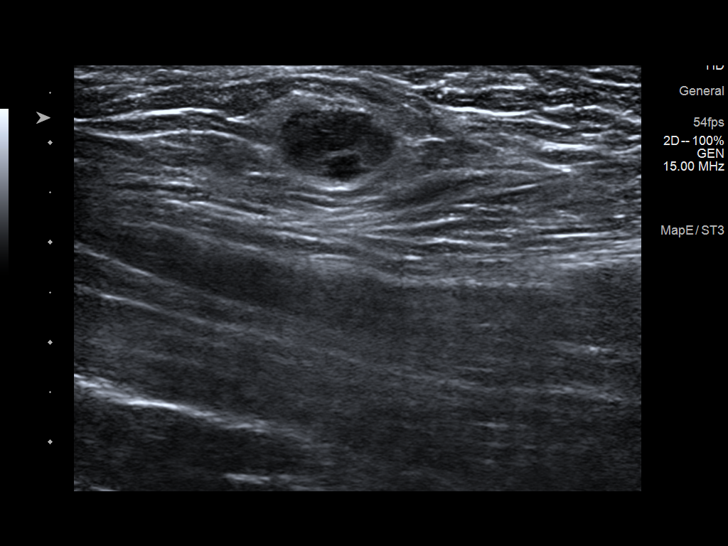
[im 11/19]
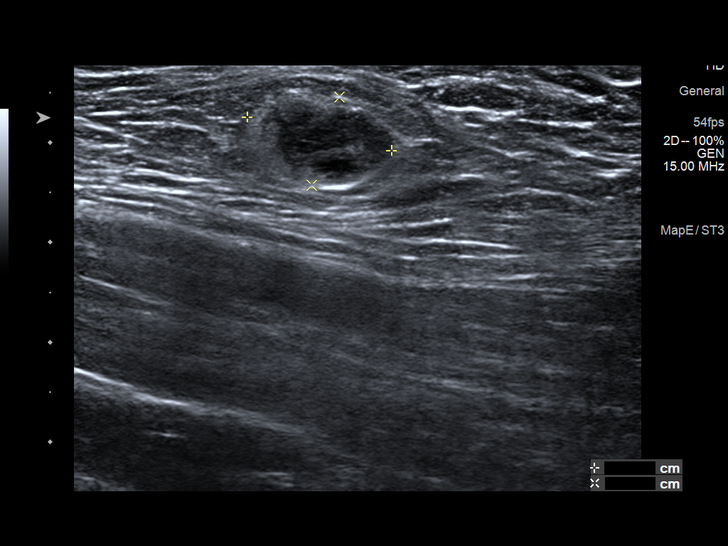
[im 13/19]
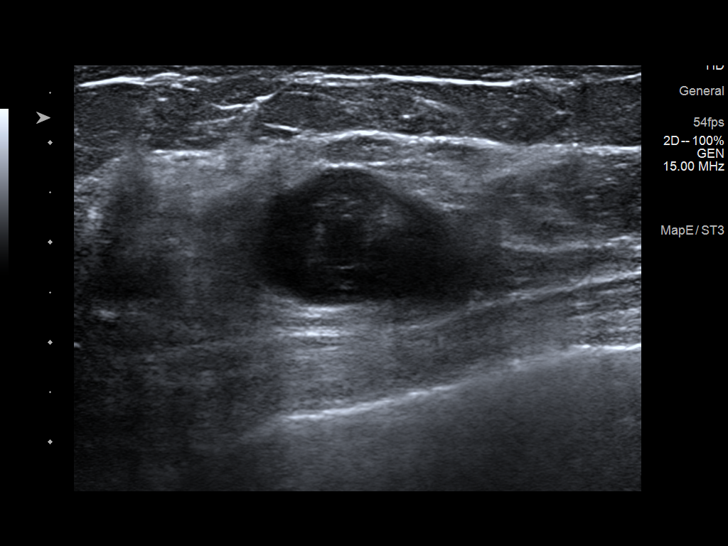
[im 14/19]
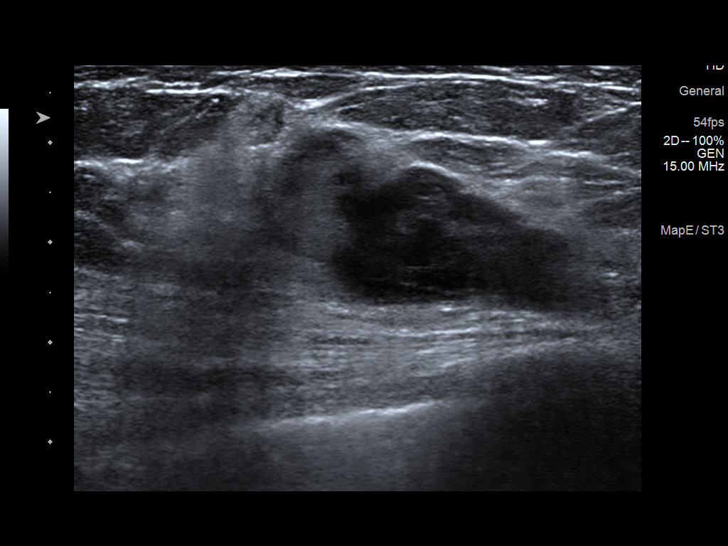
[im 16/19]
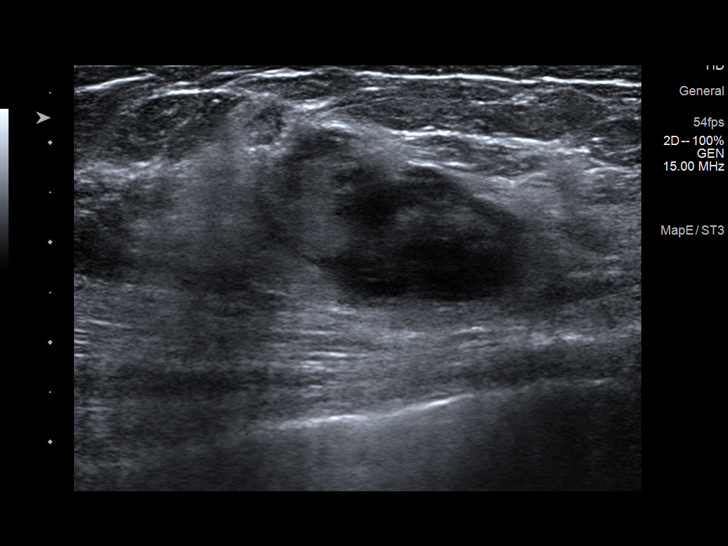
[im 17/19]
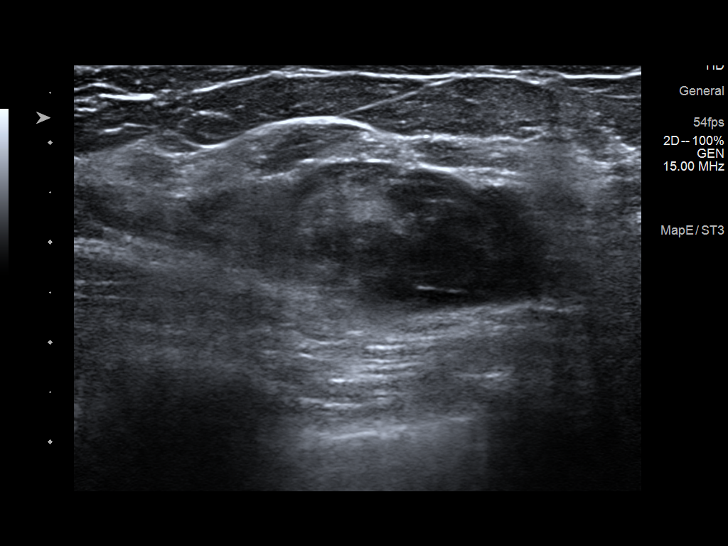
[im 19/19]
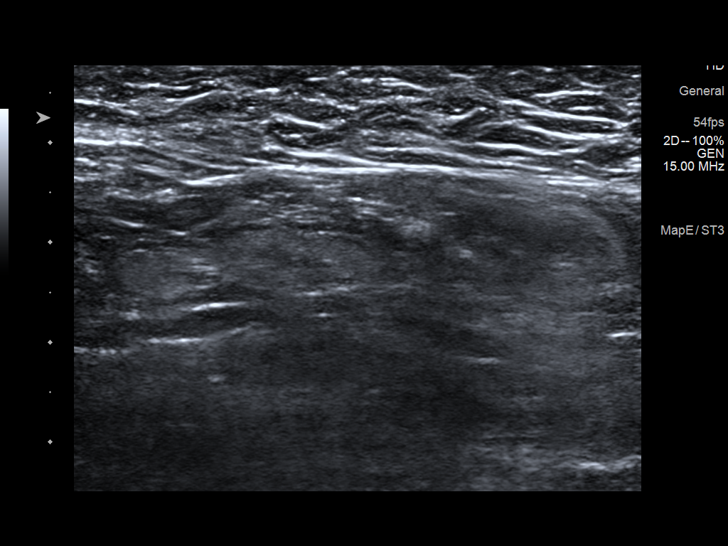

[13 of 19 positions shown; findings below may reference images not displayed]

ACR Breast Density Category c: The breast tissue is heterogeneously
dense, which may obscure small masses.
FINDINGS: Examination demonstrates a lobulated mass with partially
circumscribed and partially obscured borders over the inner upper
right breast corresponding to patient's palpable abnormality.
Remainder the exam is unchanged.

Mammographic images were processed with CAD.

On physical exam, I palpate a firm fixed 4 x 4 cm mass at the
12:30-1 o'clock position of the right breast approximately 4-5 cm
from the nipple.

Targeted ultrasound is performed, showing a large irregular shaped
heterogeneous mass with mixed circumscribed, irregular and
indistinct borders at the 12:30-1 o'clock position of the right
breast 5 cm from the nipple measuring approximately 1.6 x 3.2 x
cm.

Ultrasound the right axilla demonstrates several normal lymph nodes
as well as a heterogeneous mass within the subcutaneous fat over the
high right axilla towards the undersurface of the arm measuring
x 1.5 x 1.6 cm likely a metastatic focus.
IMPRESSION: Suspicious mass over the 12:30-1 o'clock position of the right
breast 5 cm from the nipple measuring 1.6 x 3.2 x 4.1 cm likely
malignant.

Suspicious mass over the high right axilla measuring 1.6 cm in
greatest diameter likely metastatic disease.

RECOMMENDATION:
Recommend ultrasound-guided core needle biopsy of the right breast
mass and high right axillary mass as described.

I have discussed the findings and recommendations with the patient.
Results were also provided in writing at the conclusion of the
visit. If applicable, a reminder letter will be sent to the patient
regarding the next appointment.

BI-RADS CATEGORY  5: Highly suggestive of malignancy.

Biopsy scheduled for tomorrow at [DATE] a.m. here at the [REDACTED].
